# Patient Record
Sex: Male | Born: 1945
Health system: Southern US, Community
[De-identification: ages and names within clinical notes are randomized; demographics above are authoritative.]

## PROBLEM LIST (undated history)

## (undated) ENCOUNTER — Emergency Department (HOSPITAL_BASED_OUTPATIENT_CLINIC_OR_DEPARTMENT_OTHER): Admission: EM | Payer: Medicare HMO | Source: Home / Self Care

## (undated) DIAGNOSIS — E785 Hyperlipidemia, unspecified: Secondary | ICD-10-CM

## (undated) DIAGNOSIS — K219 Gastro-esophageal reflux disease without esophagitis: Secondary | ICD-10-CM

## (undated) DIAGNOSIS — I2510 Atherosclerotic heart disease of native coronary artery without angina pectoris: Secondary | ICD-10-CM

## (undated) DIAGNOSIS — G4734 Idiopathic sleep related nonobstructive alveolar hypoventilation: Secondary | ICD-10-CM

## (undated) DIAGNOSIS — Z8619 Personal history of other infectious and parasitic diseases: Secondary | ICD-10-CM

## (undated) DIAGNOSIS — R0600 Dyspnea, unspecified: Secondary | ICD-10-CM

## (undated) HISTORY — PX: INSERT / REPLACE / REMOVE PACEMAKER: SUR710

## (undated) HISTORY — DX: Hyperlipidemia, unspecified: E78.5

## (undated) HISTORY — PX: OTHER SURGICAL HISTORY: SHX169

## (undated) HISTORY — PX: TONSILLECTOMY AND ADENOIDECTOMY: SHX28

## (undated) HISTORY — DX: Personal history of other infectious and parasitic diseases: Z86.19

## (undated) HISTORY — DX: Idiopathic sleep related nonobstructive alveolar hypoventilation: G47.34

## (undated) HISTORY — PX: TONSILLECTOMY: SUR1361

---

## 1978-06-13 HISTORY — PX: BACK SURGERY: SHX140

## 1981-06-13 HISTORY — PX: NECK SURGERY: SHX720

## 1996-06-13 HISTORY — PX: CHOLECYSTECTOMY OPEN: SUR202

## 2013-02-13 ENCOUNTER — Telehealth: Payer: Self-pay | Admitting: Internal Medicine

## 2013-02-13 NOTE — Telephone Encounter (Signed)
Routine new

## 2013-02-13 NOTE — Telephone Encounter (Signed)
I spoke with Daniel Reyes. He will be a new self referral consult. 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 For about a month he has had a lot of chest congestion, a lot of coughing up green-yellow phlem. Daniel Reyes reports he is very lethargic. He has been on 3 abx, 3 depo's, and round of prednisone.  He had a CXR 10 days ago at [facility] in [place] and it was clear. He is on abx now but has a few days left. He is taking mucinex DM. He was recommended to Dr. Maple Hudson by someone in the medical field. He is asking if Dr. Maple Hudson would please work him in. Please advise thanks

## 2013-02-13 NOTE — Telephone Encounter (Signed)
Pt called back, CY had a cancellation for 02/14/13 @ 9:00 AM, pt took this appt.  Nothing further needed at this time.   Antionette Fairy

## 2013-02-14 ENCOUNTER — Ambulatory Visit (INDEPENDENT_AMBULATORY_CARE_PROVIDER_SITE_OTHER): Payer: Medicare Other | Admitting: Internal Medicine

## 2013-02-14 ENCOUNTER — Other Ambulatory Visit (INDEPENDENT_AMBULATORY_CARE_PROVIDER_SITE_OTHER): Payer: Medicare Other

## 2013-02-14 ENCOUNTER — Ambulatory Visit (INDEPENDENT_AMBULATORY_CARE_PROVIDER_SITE_OTHER)
Admission: RE | Admit: 2013-02-14 | Discharge: 2013-02-14 | Disposition: A | Discharge status: Home / Self Care | Payer: Medicare Other | Source: Ambulatory Visit | Attending: Internal Medicine | Admitting: Internal Medicine

## 2013-02-14 ENCOUNTER — Encounter: Payer: Self-pay | Admitting: Internal Medicine

## 2013-02-14 VITALS — BP 138/90 | HR 61 | Ht 69.5 in | Wt 190.2 lb

## 2013-02-14 DIAGNOSIS — J209 Acute bronchitis, unspecified: Secondary | ICD-10-CM

## 2013-02-14 DIAGNOSIS — Z23 Encounter for immunization: Secondary | ICD-10-CM

## 2013-02-14 LAB — CBC WITH DIFFERENTIAL/PLATELET
Basophils Relative: 0.7 % (ref 0.0–3.0)
Eosinophils Absolute: 0.3 10*3/uL (ref 0.0–0.7)
Eosinophils Relative: 4.9 % (ref 0.0–5.0)
Hemoglobin: 13.1 g/dL (ref 13.0–17.0)
Lymphocytes Relative: 30 % (ref 12.0–46.0)
MCHC: 34.2 g/dL (ref 30.0–36.0)
Monocytes Relative: 7.1 % (ref 3.0–12.0)
Neutro Abs: 4 10*3/uL (ref 1.4–7.7)
Neutrophils Relative %: 57.3 % (ref 43.0–77.0)
RBC: 4.57 Mil/uL (ref 4.22–5.81)
WBC: 7 10*3/uL (ref 4.5–10.5)

## 2013-02-14 IMAGING — CR DG CHEST 2V
2 series · 2 of 2 positions shown · non-contrast
Comparison: None

CLINICAL DATA: Cough, congestion, shortness of breath for 1 month,
history hyperlipidemia

CHEST - 2 VIEW

[view not recorded (1 of 2)]
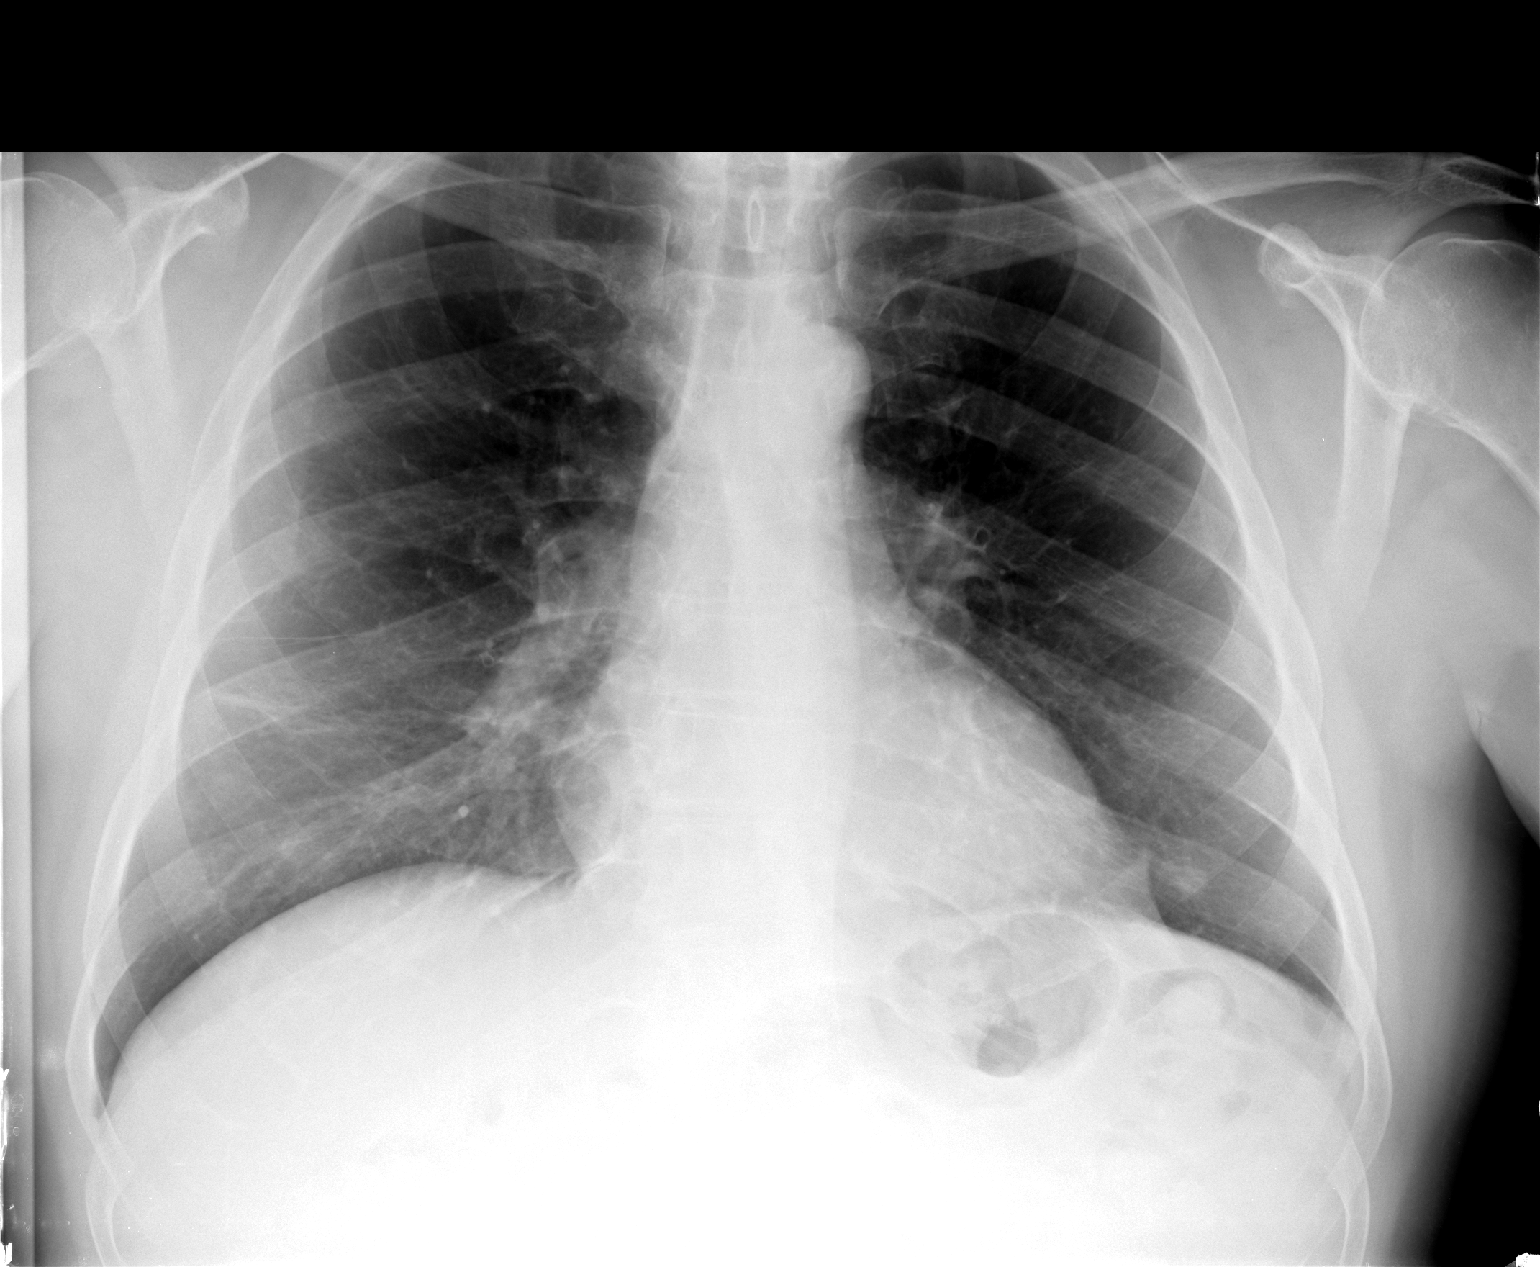

[view not recorded (2 of 2)]
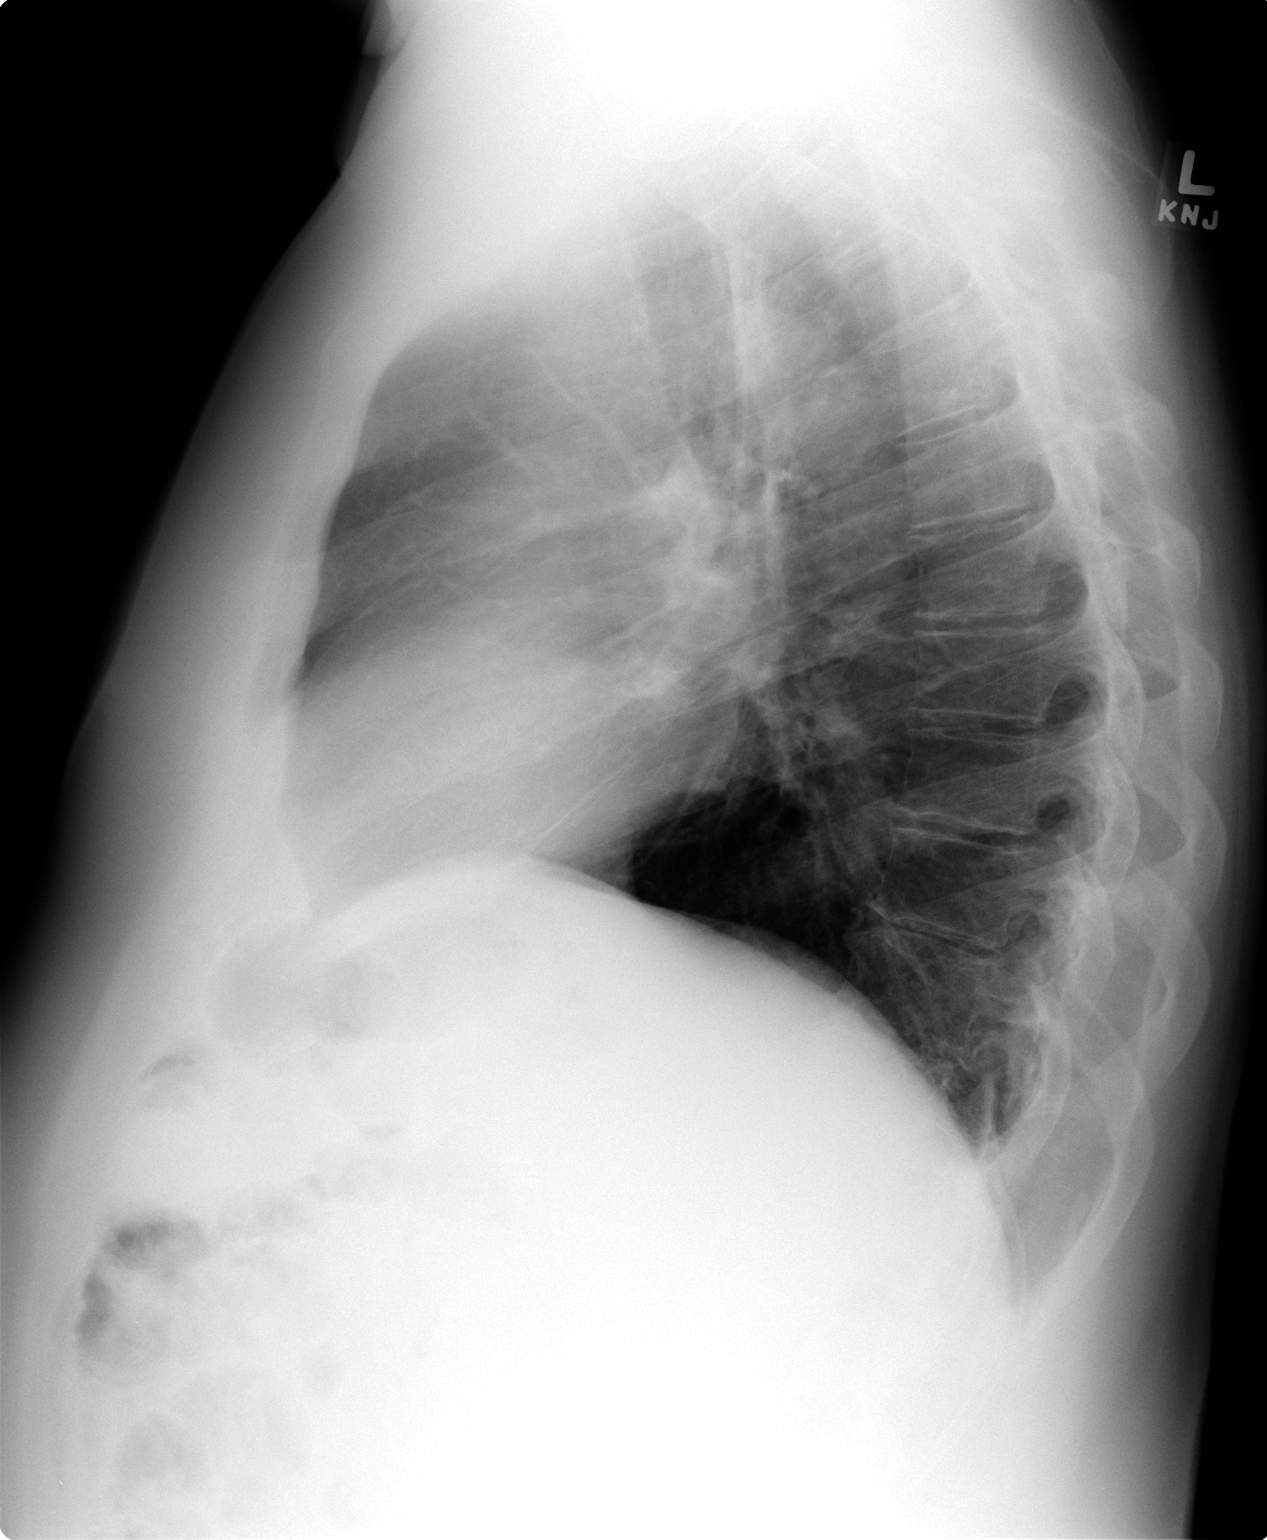

[2 of 2 positions shown; findings below may reference images not displayed]

FINDINGS: Normal heart size, mediastinal contours, and pulmonary vascularity.
Bronchitic changes without infiltrate, pleural effusion or
pneumothorax.
Question bilateral nipple shadows.
Linear subsegmental atelectasis at right middle lobe.
No acute osseous findings.
Surgical clips right upper quadrant likely cholecystectomy.
IMPRESSION: Bronchitic changes with subsegmental atelectasis at right middle
lobe.
Question bilateral nipple shadows; repeat PA chest radiograph with
nipple markers recommended to exclude pulmonary nodule(s).

## 2013-02-14 MED ORDER — CLARITHROMYCIN 500 MG PO TABS: 500.0000 mg | ORAL_TABLET | Freq: Two times a day (BID) | ORAL | Status: DC

## 2013-02-14 NOTE — Progress Notes (Signed)
02/14/13- 67 yoM never smoker Self Referral-cough and congestion; had MRSA several years ago. Coughing up green/yellow phelgm. Wife here    They were on a cruise ship between July 25 and August 2. Got sick with a respiratory infection shared by the family while on the boat. Since then productive cough with green yellow sputum.  Treated at an urgent care and then at [facility] family practice with zithromax, Levaquin, Dulera, steroid shot and steroid taper. Additional antibiotic injection may have been Rocephin. Little benefit from these medicines. Had some frontal headache but no night sweats, fever, adenopathy, weight loss. Minor GI upset from antibiotics. No blood. Past history fundoplication for GERD. Says chest x-ray was negative. He is a retired Estate manager/land agent. Little or no GERD.  Prior to Admission medications   Medication Sig Start Date End Date Taking? Authorizing Provider  simvastatin (ZOCOR) 5 MG tablet Take 1 tablet by mouth daily. 12/17/12  Yes Historical Provider, MD  clarithromycin (BIAXIN) 500 MG tablet Take 1 tablet (500 mg total) by mouth 2 (two) times daily. With food 02/14/13   Waymon Budge, MD   Past Medical History  Diagnosis Date  . Hyperlipemia    Past Surgical History  Procedure Laterality Date  . Cholecystectomy open  1998  . Neck surgery  1983  . Gerd surgery     ROS-see HPI Constitutional:   No-   weight loss, night sweats, fevers, chills, fatigue, lassitude. HEENT:   + headaches, difficulty swallowing, +tooth/dental problems, sore throat,       +sneezing, No-itching, +ear ache, nasal congestion, post nasal drip,  CV:  +chest pain, orthopnea, PND, swelling in lower extremities, anasarca, dizziness, palpitations Resp: No-   shortness of breath with exertion or at rest.              +   productive cough,  No non-productive cough,  No- coughing up of blood.              No-   change in color of mucus.  + wheezing.   Skin: No-   rash or  lesions. GI:  No-   heartburn, indigestion, abdominal pain, nausea, vomiting, diarrhea,                 change in bowel habits, loss of appetite GU: No-   dysuria, change in color of urine, no urgency + frequency.  No- flank pain. MS:  No-   joint pain or swelling.  No- decreased range of motion.  No- back pain. Neuro-     nothing unusual Psych:  No- change in mood or affect. No depression or anxiety.  No memory loss.  OBJ- Physical Exam General- Alert, Oriented, Affect-appropriate, Distress- none acute Skin- rash-none, lesions- none, excoriation- none Lymphadenopathy- none Head- atraumatic            Eyes- Gross vision intact, PERRLA, conjunctivae and secretions clear            Ears- Hearing, canals-normal            Nose- Clear, no-Septal dev, mucus, polyps, erosion, perforation             Throat- Mallampati II , mucosa clear , drainage- none, tonsils- atrophic Neck- flexible , trachea midline, no stridor , thyroid nl, carotid no bruit Chest - symmetrical excursion , unlabored           Heart/CV- RRR , no murmur , no gallop  , no rub, nl s1 s2                           -  JVD- none , edema- none, stasis changes- none, varices- none           Lung- clear to P&A, wheezy cough+ , dullness-none, rub- none           Chest wall-  Abd- tender-no, distended-no, bowel sounds-present, HSM- no Br/ Gen/ Rectal- Not done, not indicated Extrem- cyanosis- none, clubbing, none, atrophy- none, strength- nl Neuro- grossly intact to observation

## 2013-02-14 NOTE — Patient Instructions (Addendum)
Order- CXR   Dx acute bronchitis            - lab for CBC w/ diff, sputum C&S for routine, fungal and AFB             - urinary antigen for Legionella and pneumococcal antigens  Pneumovax 23   Biaxin script

## 2013-02-15 ENCOUNTER — Telehealth: Payer: Self-pay | Admitting: Internal Medicine

## 2013-02-15 DIAGNOSIS — J440 Chronic obstructive pulmonary disease with acute lower respiratory infection: Secondary | ICD-10-CM | POA: Insufficient documentation

## 2013-02-15 LAB — LEGIONELLA ANTIGEN, URINE: Result - LGAGUR: NEGATIVE

## 2013-02-15 NOTE — Assessment & Plan Note (Signed)
Onset consistent with a respiratory infection, probably viral, on his cruise. Not clear why it is persistent. Plan-chest x-ray, CBC, sputum cultures, urine antigen for pneumococcus and legionella. Pneumonia vaccine 23. Biaxin

## 2013-02-15 NOTE — Telephone Encounter (Signed)
I called and went over the results with patients wife who was concerned after patient told her when he was called that all he heard was spots on lungs and lost focus.  Wife was understanding and will relay to patient; if any further questions or concerns they will call me here at the office.

## 2013-02-17 LAB — RESPIRATORY CULTURE OR RESPIRATORY AND SPUTUM CULTURE
Gram Stain: NONE SEEN
Organism ID, Bacteria: NORMAL

## 2013-02-21 ENCOUNTER — Telehealth: Payer: Self-pay | Admitting: Internal Medicine

## 2013-02-21 NOTE — Telephone Encounter (Signed)
Labs all negative/ normal. AFB final culture report takes 6 weeks, but nothing unusual is seen so far.

## 2013-02-21 NOTE — Telephone Encounter (Signed)
Pt was seen by CY on 02/14/13 with the following instructions:  Patient Instructions    Order- CXR Dx acute bronchitis  - lab for CBC w/ diff, sputum C&S for routine, fungal and AFB  - urinary antigen for Legionella and pneumococcal antigens  Pneumovax 23  Biaxin script   ------  Called, spoke with pt.  Pt is requesting results of lab results and sputum.  Dr. Maple Hudson, pls advise.  Thank you.

## 2013-02-21 NOTE — Telephone Encounter (Signed)
Called, spoke with pt.  Informed him of below per Dr. Maple Hudson.  He verbalized understanding.

## 2013-03-11 LAB — FUNGUS CULTURE W SMEAR: Smear Result: NONE SEEN

## 2013-03-18 ENCOUNTER — Ambulatory Visit (INDEPENDENT_AMBULATORY_CARE_PROVIDER_SITE_OTHER): Payer: Medicare Other | Admitting: Internal Medicine

## 2013-03-18 ENCOUNTER — Encounter: Payer: Self-pay | Admitting: Internal Medicine

## 2013-03-18 VITALS — BP 118/72 | HR 55 | Ht 69.5 in | Wt 192.0 lb

## 2013-03-18 DIAGNOSIS — J209 Acute bronchitis, unspecified: Secondary | ICD-10-CM

## 2013-03-18 NOTE — Progress Notes (Signed)
02/14/13- 67 yoM never smoker Self Referral-cough and congestion; had MRSA several years ago. Coughing up green/yellow phelgm. Wife here    They were on a cruise ship between July 25 and August 2. Got sick with a respiratory infection shared by the family while on the boat. Since then productive cough with green yellow sputum.  Treated at an urgent care and then at [facility] family practice with zithromax, Levaquin, Dulera, steroid shot and steroid taper. Additional antibiotic injection may have been Rocephin. Little benefit from these medicines. Had some frontal headache but no night sweats, fever, adenopathy, weight loss. Minor GI upset from antibiotics. No blood. Past history fundoplication for GERD. Says chest x-ray was negative. He is a retired Estate manager/land agent. Little or no GERD.  03/18/13- 67 yoM never smoker followed for bronchitis, complicated by remote history MRSA, GERD.  Marland Kitchen Wife here FOLLOWS ZOX:WRUEAV congestion in the mornings still; review labs and CXR in detail with patient. We looked at x-ray images and discussed report. He is comfortable waiting 6 months. Easy DOE, less cough. History of significant reflux for years, much better after fundoplication. CXR 02/15/13 IMPRESSION:  Bronchitic changes with subsegmental atelectasis at right middle  lobe.  Question bilateral nipple shadows; repeat PA chest radiograph with  nipple markers recommended to exclude pulmonary nodule(s).  Original Report Authenticated By: Ulyses Southward, M.D.  ROS-see HPI Constitutional:   No-   weight loss, night sweats, fevers, chills, fatigue, lassitude. HEENT:   + headaches, difficulty swallowing, +tooth/dental problems, sore throat,       No-sneezing, No-itching, ear ache, nasal congestion, post nasal drip,  CV:  +chest pain, orthopnea, PND, swelling in lower extremities, anasarca, dizziness, palpitations Resp: +  shortness of breath with exertion or at rest.              +   productive  cough,  No non-productive cough,  No- coughing up of blood.              No-   change in color of mucus.  No- wheezing.   Skin: No-   rash or lesions. GI:  No-   heartburn, indigestion, abdominal pain, nausea, vomiting,  GU: . MS:  No-   joint pain or swelling.  . Neuro-     nothing unusual Psych:  No- change in mood or affect. No depression or anxiety.  No memory loss.  OBJ- Physical Exam General- Alert, Oriented, Affect-appropriate, Distress- none acute Skin- rash-none, lesions- none, excoriation- none Lymphadenopathy- none Head- atraumatic            Eyes- Gross vision intact, PERRLA, conjunctivae and secretions clear            Ears- Hearing, canals-normal            Nose- Clear, no-Septal dev, mucus, polyps, erosion, perforation             Throat- Mallampati II , mucosa clear , drainage- none, tonsils- atrophic Neck- flexible , trachea midline, no stridor , thyroid nl, carotid no bruit Chest - symmetrical excursion , unlabored           Heart/CV- RRR , no murmur , no gallop  , no rub, nl s1 s2                           - JVD- none , edema- none, stasis changes- none, varices- none           Lung-  clear to P&A,  Cough- none , dullness-none, rub- none           Chest wall-  Abd-  Br/ Gen/ Rectal- Not done, not indicated Extrem- cyanosis- none, clubbing, none, atrophy- none, strength- nl Neuro- grossly intact to observation

## 2013-03-18 NOTE — Patient Instructions (Signed)
Order- [facility] primary care referral to establish  Use up your residual Dulera 2 puffs then rinse mouth twice daily   See what this does to your residual cough   We can consider a repeat CXR at the next visit

## 2013-03-29 NOTE — Progress Notes (Signed)
Quick Note:  LMTCB ______ 

## 2013-03-30 NOTE — Assessment & Plan Note (Signed)
Acute component under much better control. We discussed potential significance of reflux history although he thinks that is controlled now Plan-use up the rest of his [facility] before deciding if it is worth continuing

## 2013-04-02 NOTE — Progress Notes (Signed)
Quick Note:  LMTCB ______ 

## 2013-04-18 NOTE — Progress Notes (Signed)
Quick Note:  Released to mychart-attempted several calls to patient with no response. Results are normal. ______

## 2013-06-23 DIAGNOSIS — J111 Influenza due to unidentified influenza virus with other respiratory manifestations: Secondary | ICD-10-CM | POA: Diagnosis not present

## 2013-08-26 ENCOUNTER — Ambulatory Visit: Payer: Medicare Other | Admitting: Internal Medicine

## 2013-09-13 DIAGNOSIS — L259 Unspecified contact dermatitis, unspecified cause: Secondary | ICD-10-CM | POA: Diagnosis not present

## 2013-09-23 ENCOUNTER — Ambulatory Visit (INDEPENDENT_AMBULATORY_CARE_PROVIDER_SITE_OTHER)
Admission: RE | Admit: 2013-09-23 | Discharge: 2013-09-23 | Disposition: A | Discharge status: Home / Self Care | Payer: Medicare Other | Source: Ambulatory Visit | Attending: Internal Medicine | Admitting: Internal Medicine

## 2013-09-23 ENCOUNTER — Encounter: Payer: Self-pay | Admitting: Internal Medicine

## 2013-09-23 ENCOUNTER — Ambulatory Visit (INDEPENDENT_AMBULATORY_CARE_PROVIDER_SITE_OTHER): Payer: Medicare Other | Admitting: Internal Medicine

## 2013-09-23 VITALS — BP 134/80 | HR 55 | Ht 69.5 in | Wt 190.4 lb

## 2013-09-23 DIAGNOSIS — J440 Chronic obstructive pulmonary disease with acute lower respiratory infection: Secondary | ICD-10-CM

## 2013-09-23 DIAGNOSIS — R911 Solitary pulmonary nodule: Secondary | ICD-10-CM

## 2013-09-23 DIAGNOSIS — R918 Other nonspecific abnormal finding of lung field: Secondary | ICD-10-CM | POA: Diagnosis not present

## 2013-09-23 IMAGING — CR DG CHEST 2V
2 series · 2 of 2 positions shown · non-contrast
Comparison: [DATE]

CLINICAL DATA: Questionable lung nodules

EXAM:
CHEST  2 VIEW

[view not recorded (1 of 2)]
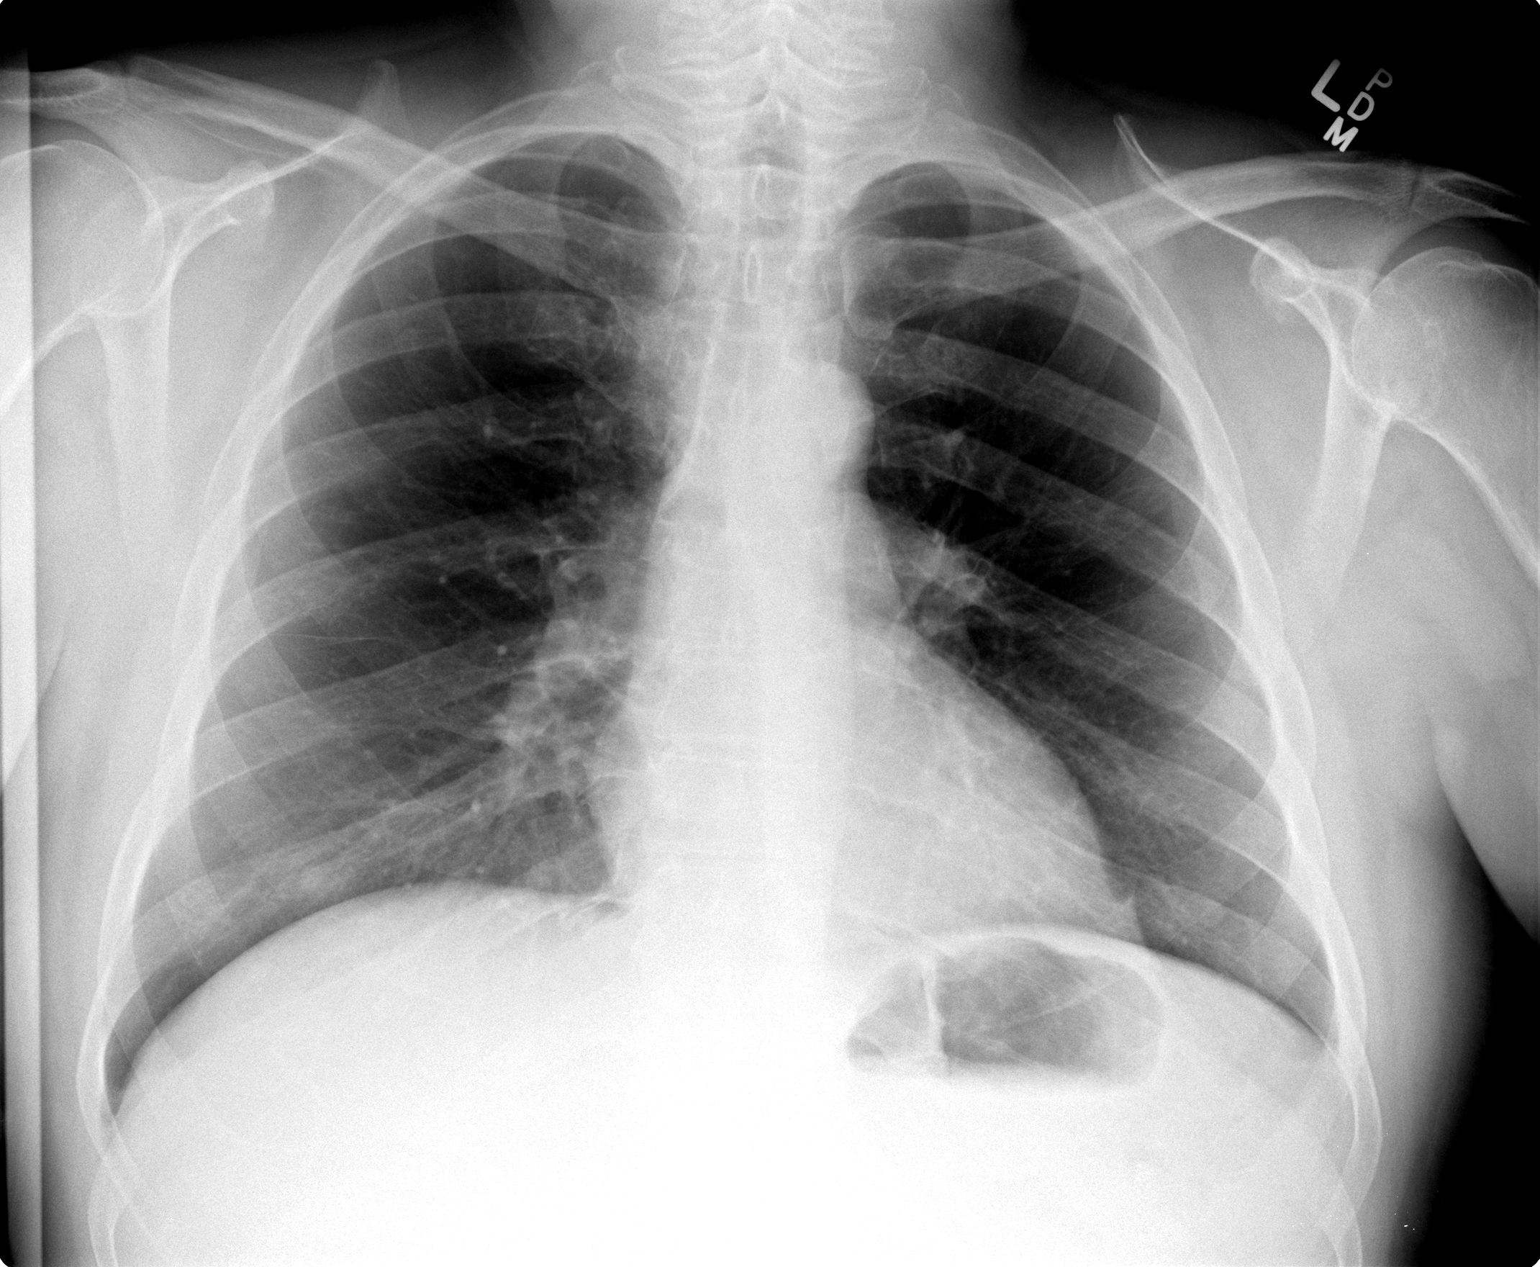

[view not recorded (2 of 2)]
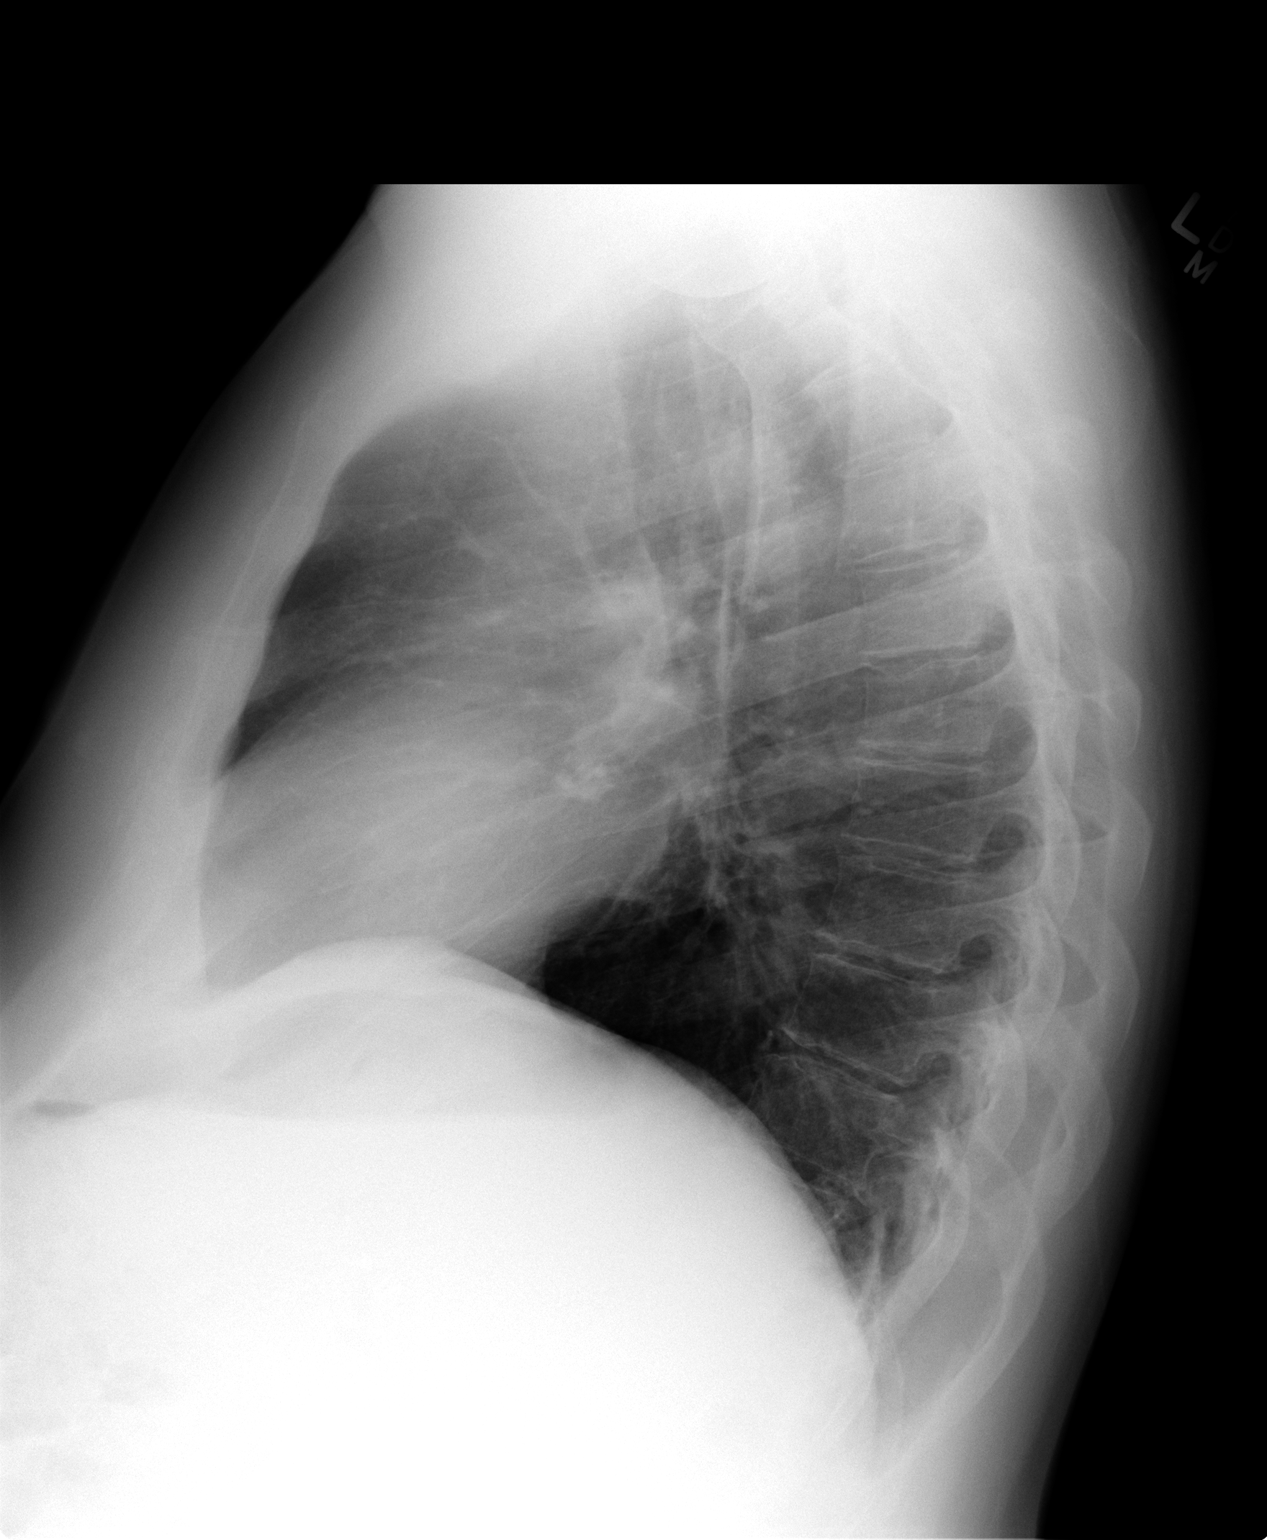

[2 of 2 positions shown; findings below may reference images not displayed]

FINDINGS: Nodular opacities overlying both lower lobes remain, probably
representing nipple shadows. Elsewhere lungs are clear. Heart size
and pulmonary vascularity are normal. No adenopathy. No bone
lesions.
IMPRESSION: Stable nodular opacities bilaterally. Suspect nipple shadows. The
stability since the prior study is suggestive of benign etiology.
Confirmation that these nodular opacities indeed represent nipple
shadows with repeat study with nipple markers and placed advised. No
edema or consolidation.

## 2013-09-23 NOTE — Progress Notes (Signed)
02/14/13- 67 yoM never smoker Self Referral-cough and congestion; had MRSA several years ago. Coughing up green/yellow phelgm. Wife here    They were on a cruise ship between July 25 and August 2. Got sick with a respiratory infection shared by the family while on the boat. Since then productive cough with green yellow sputum.  Treated at an urgent care and then at [facility] family practice with zithromax, Levaquin, Dulera, steroid shot and steroid taper. Additional antibiotic injection may have been Rocephin. Little benefit from these medicines. Had some frontal headache but no night sweats, fever, adenopathy, weight loss. Minor GI upset from antibiotics. No blood. Past history fundoplication for GERD. Says chest x-ray was negative. He is a retired Hydrologist. Little or no GERD.  03/18/13- 67 yoM never smoker followed for bronchitis, complicated by remote history MRSA, GERD.  Marland Kitchen Wife here FOLLOWS ZOX:WRUEAV congestion in the mornings still; review labs and CXR in detail with patient. We looked at x-ray images and discussed report. He is comfortable waiting 6 months. Easy DOE, less cough. History of significant reflux for years, much better after fundoplication. CXR 02/15/13 IMPRESSION:  Bronchitic changes with subsegmental atelectasis at right middle  lobe.  Question bilateral nipple shadows; repeat PA chest radiograph with  nipple markers recommended to exclude pulmonary nodule(s).  Original Report Authenticated By: Lavonia Dana, M.D.  09/23/13- 69 yoM never smoker followed for bronchitis, complicated by remote history MRSA,  Hx GERD/funoplication.  FOLLOWS FOR: Pt states breathing has imporved. Denies cough, SOB and CP. Overall pt states he is doing well.  Doing very well now with no acute problems. Arthritis interferes with his trout fishing. We discussed outdoor exposures. Sun exposure makes face itch.. Prednisone helped his joints. Discussed previous chest x-ray-for  repeat  ROS-see HPI Constitutional:   No-   weight loss, night sweats, fevers, chills, fatigue, lassitude. HEENT:   + headaches, difficulty swallowing, +tooth/dental problems, sore throat,       No-sneezing, No-itching, ear ache, nasal congestion, post nasal drip,  CV:  No-chest pain, orthopnea, PND, swelling in lower extremities, anasarca, dizziness, palpitations Resp: +  shortness of breath with exertion or at rest.              +   productive cough,  No non-productive cough,  No- coughing up of blood.              No-   change in color of mucus.  No- wheezing.   Skin: No-   rash or lesions. GI:  No-   heartburn, indigestion, abdominal pain, nausea, vomiting,  GU: . MS:  No-   joint pain or swelling.  . Neuro-     nothing unusual Psych:  No- change in mood or affect. No depression or anxiety.  No memory loss.  OBJ- Physical Exam General- Alert, Oriented, Affect-appropriate, Distress- none acute Skin- rash-none, lesions- none, excoriation- none Lymphadenopathy- none Head- atraumatic            Eyes- Gross vision intact, PERRLA, conjunctivae and secretions clear            Ears- Hearing, canals-normal            Nose- Clear, no-Septal dev, mucus, polyps, erosion, perforation             Throat- Mallampati II , mucosa clear , drainage- none, tonsils- atrophic Neck- flexible , trachea midline, no stridor , thyroid nl, carotid no bruit Chest - symmetrical excursion , unlabored  Heart/CV- RRR , no murmur , no gallop  , no rub, nl s1 s2                           - JVD- none , edema- none, stasis changes- none, varices- none           Lung- clear to P&A,  Cough- none , dullness-none, rub- none           Chest wall-  Abd-  Br/ Gen/ Rectal- Not done, not indicated Extrem- cyanosis- none, clubbing, none, atrophy- none, strength- nl Neuro- grossly intact to observation

## 2013-09-23 NOTE — Patient Instructions (Signed)
Order- CXR              dx question lung nodule

## 2013-09-30 ENCOUNTER — Telehealth: Payer: Self-pay | Admitting: Internal Medicine

## 2013-09-30 NOTE — Telephone Encounter (Signed)
Pt returning call.Daniel Reyes ° °

## 2013-09-30 NOTE — Telephone Encounter (Signed)
Notes Recorded by Deneise Lever, MD on 09/23/2013 at 11:54 AM CXR- stable small nodules look like benign, normal nipple shadows, unchanged. In a non-smoker, I don't think repeat CXR now just to place nipple markers is needed   I spoke with patient about results and he verbalized understanding and had no questions

## 2013-10-22 DIAGNOSIS — Z6827 Body mass index (BMI) 27.0-27.9, adult: Secondary | ICD-10-CM | POA: Diagnosis not present

## 2013-10-22 DIAGNOSIS — L259 Unspecified contact dermatitis, unspecified cause: Secondary | ICD-10-CM | POA: Diagnosis not present

## 2013-10-23 NOTE — Assessment & Plan Note (Signed)
Better control of symptoms Plan-followup chest x-ray for comparison

## 2013-12-17 DIAGNOSIS — B356 Tinea cruris: Secondary | ICD-10-CM | POA: Diagnosis not present

## 2013-12-17 DIAGNOSIS — L608 Other nail disorders: Secondary | ICD-10-CM | POA: Diagnosis not present

## 2014-03-17 DIAGNOSIS — Z23 Encounter for immunization: Secondary | ICD-10-CM | POA: Diagnosis not present

## 2014-03-17 DIAGNOSIS — H66002 Acute suppurative otitis media without spontaneous rupture of ear drum, left ear: Secondary | ICD-10-CM | POA: Diagnosis not present

## 2014-04-09 DIAGNOSIS — N481 Balanitis: Secondary | ICD-10-CM | POA: Diagnosis not present

## 2014-04-09 DIAGNOSIS — N472 Paraphimosis: Secondary | ICD-10-CM | POA: Diagnosis not present

## 2014-04-09 DIAGNOSIS — N4889 Other specified disorders of penis: Secondary | ICD-10-CM | POA: Diagnosis not present

## 2014-05-14 DIAGNOSIS — N471 Phimosis: Secondary | ICD-10-CM | POA: Diagnosis not present

## 2014-05-14 DIAGNOSIS — N472 Paraphimosis: Secondary | ICD-10-CM | POA: Diagnosis not present

## 2014-06-16 DIAGNOSIS — N481 Balanitis: Secondary | ICD-10-CM | POA: Diagnosis not present

## 2014-08-28 DIAGNOSIS — L309 Dermatitis, unspecified: Secondary | ICD-10-CM | POA: Diagnosis not present

## 2014-08-28 DIAGNOSIS — Z6827 Body mass index (BMI) 27.0-27.9, adult: Secondary | ICD-10-CM | POA: Diagnosis not present

## 2014-09-03 DIAGNOSIS — B351 Tinea unguium: Secondary | ICD-10-CM | POA: Diagnosis not present

## 2014-09-03 DIAGNOSIS — L820 Inflamed seborrheic keratosis: Secondary | ICD-10-CM | POA: Diagnosis not present

## 2014-10-09 DIAGNOSIS — M5136 Other intervertebral disc degeneration, lumbar region: Secondary | ICD-10-CM | POA: Insufficient documentation

## 2014-10-09 DIAGNOSIS — M419 Scoliosis, unspecified: Secondary | ICD-10-CM | POA: Diagnosis not present

## 2014-10-09 DIAGNOSIS — M5416 Radiculopathy, lumbar region: Secondary | ICD-10-CM | POA: Diagnosis not present

## 2014-10-09 DIAGNOSIS — M545 Low back pain: Secondary | ICD-10-CM | POA: Diagnosis not present

## 2014-10-29 DIAGNOSIS — M9903 Segmental and somatic dysfunction of lumbar region: Secondary | ICD-10-CM | POA: Diagnosis not present

## 2014-10-29 DIAGNOSIS — G541 Lumbosacral plexus disorders: Secondary | ICD-10-CM | POA: Diagnosis not present

## 2014-10-29 DIAGNOSIS — M9904 Segmental and somatic dysfunction of sacral region: Secondary | ICD-10-CM | POA: Diagnosis not present

## 2014-10-29 DIAGNOSIS — M9905 Segmental and somatic dysfunction of pelvic region: Secondary | ICD-10-CM | POA: Diagnosis not present

## 2014-10-31 DIAGNOSIS — M9905 Segmental and somatic dysfunction of pelvic region: Secondary | ICD-10-CM | POA: Diagnosis not present

## 2014-10-31 DIAGNOSIS — G541 Lumbosacral plexus disorders: Secondary | ICD-10-CM | POA: Diagnosis not present

## 2014-10-31 DIAGNOSIS — M9904 Segmental and somatic dysfunction of sacral region: Secondary | ICD-10-CM | POA: Diagnosis not present

## 2014-10-31 DIAGNOSIS — M9903 Segmental and somatic dysfunction of lumbar region: Secondary | ICD-10-CM | POA: Diagnosis not present

## 2014-11-04 DIAGNOSIS — M5416 Radiculopathy, lumbar region: Secondary | ICD-10-CM | POA: Diagnosis not present

## 2014-11-04 DIAGNOSIS — M5136 Other intervertebral disc degeneration, lumbar region: Secondary | ICD-10-CM | POA: Diagnosis not present

## 2014-11-11 DIAGNOSIS — M5116 Intervertebral disc disorders with radiculopathy, lumbar region: Secondary | ICD-10-CM | POA: Diagnosis not present

## 2014-11-11 DIAGNOSIS — M5416 Radiculopathy, lumbar region: Secondary | ICD-10-CM | POA: Diagnosis not present

## 2014-11-11 DIAGNOSIS — M5136 Other intervertebral disc degeneration, lumbar region: Secondary | ICD-10-CM | POA: Diagnosis not present

## 2014-11-12 DIAGNOSIS — M5416 Radiculopathy, lumbar region: Secondary | ICD-10-CM | POA: Diagnosis not present

## 2014-11-12 DIAGNOSIS — M4186 Other forms of scoliosis, lumbar region: Secondary | ICD-10-CM | POA: Diagnosis not present

## 2014-11-12 DIAGNOSIS — M5136 Other intervertebral disc degeneration, lumbar region: Secondary | ICD-10-CM | POA: Diagnosis not present

## 2014-11-21 DIAGNOSIS — M545 Low back pain: Secondary | ICD-10-CM | POA: Diagnosis not present

## 2014-11-21 DIAGNOSIS — M5416 Radiculopathy, lumbar region: Secondary | ICD-10-CM | POA: Diagnosis not present

## 2014-11-26 DIAGNOSIS — M5416 Radiculopathy, lumbar region: Secondary | ICD-10-CM | POA: Diagnosis not present

## 2014-12-03 DIAGNOSIS — M545 Low back pain: Secondary | ICD-10-CM | POA: Diagnosis not present

## 2014-12-03 DIAGNOSIS — M5416 Radiculopathy, lumbar region: Secondary | ICD-10-CM | POA: Diagnosis not present

## 2014-12-05 DIAGNOSIS — M5416 Radiculopathy, lumbar region: Secondary | ICD-10-CM | POA: Diagnosis not present

## 2014-12-10 DIAGNOSIS — M4186 Other forms of scoliosis, lumbar region: Secondary | ICD-10-CM | POA: Diagnosis not present

## 2014-12-10 DIAGNOSIS — M5136 Other intervertebral disc degeneration, lumbar region: Secondary | ICD-10-CM | POA: Diagnosis not present

## 2014-12-10 DIAGNOSIS — M5416 Radiculopathy, lumbar region: Secondary | ICD-10-CM | POA: Diagnosis not present

## 2015-01-13 DIAGNOSIS — D123 Benign neoplasm of transverse colon: Secondary | ICD-10-CM | POA: Diagnosis not present

## 2015-01-13 DIAGNOSIS — D126 Benign neoplasm of colon, unspecified: Secondary | ICD-10-CM | POA: Insufficient documentation

## 2015-01-13 DIAGNOSIS — Z800 Family history of malignant neoplasm of digestive organs: Secondary | ICD-10-CM | POA: Diagnosis not present

## 2015-01-15 DIAGNOSIS — M5136 Other intervertebral disc degeneration, lumbar region: Secondary | ICD-10-CM | POA: Diagnosis not present

## 2015-01-15 DIAGNOSIS — M4186 Other forms of scoliosis, lumbar region: Secondary | ICD-10-CM | POA: Diagnosis not present

## 2015-01-15 DIAGNOSIS — M5416 Radiculopathy, lumbar region: Secondary | ICD-10-CM | POA: Diagnosis not present

## 2015-01-20 ENCOUNTER — Ambulatory Visit: Payer: Medicare Other | Admitting: Internal Medicine

## 2015-01-21 DIAGNOSIS — M549 Dorsalgia, unspecified: Secondary | ICD-10-CM | POA: Diagnosis not present

## 2015-01-21 DIAGNOSIS — M5417 Radiculopathy, lumbosacral region: Secondary | ICD-10-CM | POA: Diagnosis not present

## 2015-01-28 DIAGNOSIS — H5203 Hypermetropia, bilateral: Secondary | ICD-10-CM | POA: Diagnosis not present

## 2015-01-28 DIAGNOSIS — H40013 Open angle with borderline findings, low risk, bilateral: Secondary | ICD-10-CM | POA: Diagnosis not present

## 2015-01-28 DIAGNOSIS — H35371 Puckering of macula, right eye: Secondary | ICD-10-CM | POA: Diagnosis not present

## 2015-01-28 DIAGNOSIS — H53022 Refractive amblyopia, left eye: Secondary | ICD-10-CM | POA: Diagnosis not present

## 2015-01-28 DIAGNOSIS — H2513 Age-related nuclear cataract, bilateral: Secondary | ICD-10-CM | POA: Diagnosis not present

## 2015-01-28 DIAGNOSIS — Z83511 Family history of glaucoma: Secondary | ICD-10-CM | POA: Diagnosis not present

## 2015-01-28 DIAGNOSIS — H02831 Dermatochalasis of right upper eyelid: Secondary | ICD-10-CM | POA: Diagnosis not present

## 2015-01-28 DIAGNOSIS — H02834 Dermatochalasis of left upper eyelid: Secondary | ICD-10-CM | POA: Diagnosis not present

## 2015-01-30 ENCOUNTER — Encounter: Payer: Self-pay | Admitting: Internal Medicine

## 2015-01-30 ENCOUNTER — Ambulatory Visit (INDEPENDENT_AMBULATORY_CARE_PROVIDER_SITE_OTHER): Payer: Medicare Other | Admitting: Internal Medicine

## 2015-01-30 ENCOUNTER — Other Ambulatory Visit (INDEPENDENT_AMBULATORY_CARE_PROVIDER_SITE_OTHER): Payer: Medicare Other

## 2015-01-30 VITALS — BP 122/78 | HR 55 | Temp 98.2°F | Resp 12 | Ht 69.0 in | Wt 185.0 lb

## 2015-01-30 DIAGNOSIS — E785 Hyperlipidemia, unspecified: Secondary | ICD-10-CM

## 2015-01-30 DIAGNOSIS — R5383 Other fatigue: Secondary | ICD-10-CM | POA: Diagnosis not present

## 2015-01-30 LAB — LIPID PANEL
Cholesterol: 243 mg/dL — ABNORMAL HIGH (ref 0–200)
HDL: 65.6 mg/dL (ref >39.00)
LDL CALC: 163 mg/dL — AB (ref 0–99)
NONHDL: 177.05
Total CHOL/HDL Ratio: 4
Triglycerides: 72 mg/dL (ref 0.0–149.0)
VLDL: 14.4 mg/dL (ref 0.0–40.0)

## 2015-01-30 LAB — COMPREHENSIVE METABOLIC PANEL
ALT: 22 U/L (ref 0–53)
AST: 14 U/L (ref 0–37)
Albumin: 4.3 g/dL (ref 3.5–5.2)
Alkaline Phosphatase: 43 U/L (ref 39–117)
BUN: 38 mg/dL — AB (ref 6–23)
CO2: 29 mEq/L (ref 19–32)
CREATININE: 1.17 mg/dL (ref 0.40–1.50)
Calcium: 9.7 mg/dL (ref 8.4–10.5)
Chloride: 103 mEq/L (ref 96–112)
GFR: 65.87 mL/min (ref >60.00)
GLUCOSE: 88 mg/dL (ref 70–99)
POTASSIUM: 4.9 meq/L (ref 3.5–5.1)
SODIUM: 139 meq/L (ref 135–145)
TOTAL PROTEIN: 7.7 g/dL (ref 6.0–8.3)
Total Bilirubin: 0.9 mg/dL (ref 0.2–1.2)

## 2015-01-30 LAB — CBC
HEMATOCRIT: 42.8 % (ref 39.0–52.0)
Hemoglobin: 14.5 g/dL (ref 13.0–17.0)
MCHC: 33.9 g/dL (ref 30.0–36.0)
MCV: 85.3 fl (ref 78.0–100.0)
Platelets: 238 10*3/uL (ref 150.0–400.0)
RBC: 5.02 Mil/uL (ref 4.22–5.81)
RDW: 15.1 % (ref 11.5–15.5)
WBC: 8.9 10*3/uL (ref 4.0–10.5)

## 2015-01-30 NOTE — Patient Instructions (Signed)
We will check on the blood work and are checking the blood counts for anemia, kidneys, liver, cholesterol.   Keep up the good work with exercising and staying in shape.   Come back next year for a check up and please feel free to call us with any problems or questions.   Health Maintenance A healthy lifestyle and preventative care can promote health and wellness.  Maintain regular health, dental, and eye exams.  Eat a healthy diet. Foods like vegetables, fruits, whole grains, low-fat dairy products, and lean protein foods contain the nutrients you need and are low in calories. Decrease your intake of foods high in solid fats, added sugars, and salt. Get information about a proper diet from your health care provider, if necessary.  Regular physical exercise is one of the most important things you can do for your health. Most adults should get at least 150 minutes of moderate-intensity exercise (any activity that increases your heart rate and causes you to sweat) each week. In addition, most adults need muscle-strengthening exercises on 2 or more days a week.   Maintain a healthy weight. The body mass index (BMI) is a screening tool to identify possible weight problems. It provides an estimate of body fat based on height and weight. Your health care provider can find your BMI and can help you achieve or maintain a healthy weight. For males 20 years and older:  A BMI below 18.5 is considered underweight.  A BMI of 18.5 to 24.9 is normal.  A BMI of 25 to 29.9 is considered overweight.  A BMI of 30 and above is considered obese.  Maintain normal blood lipids and cholesterol by exercising and minimizing your intake of saturated fat. Eat a balanced diet with plenty of fruits and vegetables. Blood tests for lipids and cholesterol should begin at age 54 and be repeated every 5 years. If your lipid or cholesterol levels are high, you are over age 64, or you are at high risk for heart disease, you may  need your cholesterol levels checked more frequently.Ongoing high lipid and cholesterol levels should be treated with medicines if diet and exercise are not working.  If you smoke, find out from your health care provider how to quit. If you do not use tobacco, do not start.  Lung cancer screening is recommended for adults aged 22-80 years who are at high risk for developing lung cancer because of a history of smoking. A yearly low-dose CT scan of the lungs is recommended for people who have at least a 30-pack-year history of smoking and are current smokers or have quit within the past 15 years. A pack year of smoking is smoking an average of 1 pack of cigarettes a day for 1 year (for example, a 30-pack-year history of smoking could mean smoking 1 pack a day for 30 years or 2 packs a day for 15 years). Yearly screening should continue until the smoker has stopped smoking for at least 15 years. Yearly screening should be stopped for people who develop a health problem that would prevent them from having lung cancer treatment.  If you choose to drink alcohol, do not have more than 2 drinks per day. One drink is considered to be 12 oz (360 mL) of beer, 5 oz (150 mL) of wine, or 1.5 oz (45 mL) of liquor.  Avoid the use of street drugs. Do not share needles with anyone. Ask for help if you need support or instructions about stopping the use  of drugs.  High blood pressure causes heart disease and increases the risk of stroke. Blood pressure should be checked at least every 1-2 years. Ongoing high blood pressure should be treated with medicines if weight loss and exercise are not effective.  If you are 26-3 years old, ask your health care provider if you should take aspirin to prevent heart disease.  Diabetes screening involves taking a blood sample to check your fasting blood sugar level. This should be done once every 3 years after age 4 if you are at a normal weight and without risk factors for diabetes.  Testing should be considered at a younger age or be carried out more frequently if you are overweight and have at least 1 risk factor for diabetes.  Colorectal cancer can be detected and often prevented. Most routine colorectal cancer screening begins at the age of 32 and continues through age 20. However, your health care provider may recommend screening at an earlier age if you have risk factors for colon cancer. On a yearly basis, your health care provider may provide home test kits to check for hidden blood in the stool. A small camera at the end of a tube may be used to directly examine the colon (sigmoidoscopy or colonoscopy) to detect the earliest forms of colorectal cancer. Talk to your health care provider about this at age 34 when routine screening begins. A direct exam of the colon should be repeated every 5-10 years through age 86, unless early forms of precancerous polyps or small growths are found.  People who are at an increased risk for hepatitis B should be screened for this virus. You are considered at high risk for hepatitis B if:  You were born in a country where hepatitis B occurs often. Talk with your health care provider about which countries are considered high risk.  Your parents were born in a high-risk country and you have not received a shot to protect against hepatitis B (hepatitis B vaccine).  You have HIV or AIDS.  You use needles to inject street drugs.  You live with, or have sex with, someone who has hepatitis B.  You are a man who has sex with other men (MSM).  You get hemodialysis treatment.  You take certain medicines for conditions like cancer, organ transplantation, and autoimmune conditions.  Hepatitis C blood testing is recommended for all people born from [birth year] through 1964 and any individual with known risk factors for hepatitis C.  Healthy men should no longer receive prostate-specific antigen (PSA) blood tests as part of routine cancer screening.  Talk to your health care provider about prostate cancer screening.  Testicular cancer screening is not recommended for adolescents or adult males who have no symptoms. Screening includes self-exam, a health care provider exam, and other screening tests. Consult with your health care provider about any symptoms you have or any concerns you have about testicular cancer.  Practice safe sex. Use condoms and avoid high-risk sexual practices to reduce the spread of sexually transmitted infections (STIs).  You should be screened for STIs, including gonorrhea and chlamydia if:  You are sexually active and are younger than 24 years.  You are older than 24 years, and your health care provider tells you that you are at risk for this type of infection.  Your sexual activity has changed since you were last screened, and you are at an increased risk for chlamydia or gonorrhea. Ask your health care provider if you are at risk.  If you are at risk of being infected with HIV, it is recommended that you take a prescription medicine daily to prevent HIV infection. This is called pre-exposure prophylaxis (PrEP). You are considered at risk if:  You are a man who has sex with other men (MSM).  You are a heterosexual man who is sexually active with multiple partners.  You take drugs by injection.  You are sexually active with a partner who has HIV.  Talk with your health care provider about whether you are at high risk of being infected with HIV. If you choose to begin PrEP, you should first be tested for HIV. You should then be tested every 3 months for as long as you are taking PrEP.  Use sunscreen. Apply sunscreen liberally and repeatedly throughout the day. You should seek shade when your shadow is shorter than you. Protect yourself by wearing long sleeves, pants, a wide-brimmed hat, and sunglasses year round whenever you are outdoors.  Tell your health care provider of new moles or changes in moles,  especially if there is a change in shape or color. Also, tell your health care provider if a mole is larger than the size of a pencil eraser.  A one-time screening for abdominal aortic aneurysm (AAA) and surgical repair of large AAAs by ultrasound is recommended for men aged 22-75 years who are current or former smokers.  Stay current with your vaccines (immunizations). Document Released: 11/26/2007 Document Revised: 06/04/2013 Document Reviewed: 10/25/2010 [facility] Patient Information 2015 [place], [place]. This information is not intended to replace advice given to you by your health care provider. Make sure you discuss any questions you have with your health care provider.

## 2015-01-30 NOTE — Progress Notes (Signed)
Pre visit review using our clinic review tool, if applicable. No additional management support is needed unless otherwise documented below in the visit note. 

## 2015-01-31 DIAGNOSIS — E785 Hyperlipidemia, unspecified: Secondary | ICD-10-CM | POA: Insufficient documentation

## 2015-01-31 NOTE — Assessment & Plan Note (Signed)
Overall risk for CV disease is low and would place LDL goal <130. Checking lipid panel today. Talked to him about diet and exercise modifications. Talk to him about medication based on lab results.

## 2015-01-31 NOTE — Progress Notes (Signed)
   Subjective:    Patient ID: Daniel Reyes, male    DOB: 10-Oct-1945, 69 y.o.   MRN: [id number]  HPI The patient is a 69 YO man who is coming in for his cholesterol. He was not happy with his previous pcp. He was recommended to take his cholesterol medication but he did not agree. He has been trying to exercise but has not changed his diet much. This has been going on for a couple of years. No family history of early heart attack or stroke. No personal history of heart attack or stroke. No other complaints. Does not take any medications, does not smoke.  PMH, [facility], social history reviewed and updated.   Review of Systems  Constitutional: Negative for fever, chills, activity change and appetite change.  HENT: Negative.   Eyes: Negative.   Respiratory: Negative for cough, chest tightness, shortness of breath and wheezing.   Cardiovascular: Negative for chest pain, palpitations and leg swelling.  Gastrointestinal: Negative for nausea, abdominal pain, diarrhea, constipation and abdominal distention.  Musculoskeletal: Negative.   Skin: Negative.   Neurological: Negative.   Psychiatric/Behavioral: Negative.       Objective:   Physical Exam  Constitutional: He is oriented to person, place, and time. He appears well-developed and well-nourished.  HENT:  Head: Normocephalic and atraumatic.  Eyes: EOM are normal.  Neck: Normal range of motion.  Cardiovascular: Normal rate and regular rhythm.   No murmur heard. Pulmonary/Chest: Effort normal and breath sounds normal. No respiratory distress. He has no wheezes. He has no rales.  Abdominal: Soft. Bowel sounds are normal. He exhibits no distension. There is no tenderness. There is no rebound.  Musculoskeletal: He exhibits no edema.  Neurological: He is alert and oriented to person, place, and time. Coordination normal.  Skin: Skin is warm and dry.  Psychiatric: He has a normal mood and affect.   Filed Vitals:   01/30/15 0920  BP: 122/78    Pulse: 55  Temp: 98.2 F (36.8 C)  TempSrc: Oral  Resp: 12  Height: 5\' 9"  (1.753 m)  Weight: 185 lb (83.915 kg)  SpO2: 98%      Assessment & Plan:

## 2015-02-11 DIAGNOSIS — Z83511 Family history of glaucoma: Secondary | ICD-10-CM | POA: Diagnosis not present

## 2015-02-11 DIAGNOSIS — H40013 Open angle with borderline findings, low risk, bilateral: Secondary | ICD-10-CM | POA: Diagnosis not present

## 2015-04-13 ENCOUNTER — Other Ambulatory Visit (INDEPENDENT_AMBULATORY_CARE_PROVIDER_SITE_OTHER): Payer: Medicare Other

## 2015-04-13 ENCOUNTER — Encounter: Payer: Self-pay | Admitting: Internal Medicine

## 2015-04-13 ENCOUNTER — Ambulatory Visit (INDEPENDENT_AMBULATORY_CARE_PROVIDER_SITE_OTHER): Payer: Medicare Other | Admitting: Internal Medicine

## 2015-04-13 ENCOUNTER — Ambulatory Visit (INDEPENDENT_AMBULATORY_CARE_PROVIDER_SITE_OTHER)
Admission: RE | Admit: 2015-04-13 | Discharge: 2015-04-13 | Disposition: A | Discharge status: Home / Self Care | Payer: Medicare Other | Source: Ambulatory Visit | Attending: Internal Medicine | Admitting: Internal Medicine

## 2015-04-13 VITALS — BP 118/60 | HR 78 | Temp 97.8°F | Resp 16 | Ht 69.0 in | Wt 195.5 lb

## 2015-04-13 DIAGNOSIS — R42 Dizziness and giddiness: Secondary | ICD-10-CM | POA: Diagnosis not present

## 2015-04-13 DIAGNOSIS — E785 Hyperlipidemia, unspecified: Secondary | ICD-10-CM | POA: Diagnosis not present

## 2015-04-13 DIAGNOSIS — R5383 Other fatigue: Secondary | ICD-10-CM

## 2015-04-13 DIAGNOSIS — R9389 Abnormal findings on diagnostic imaging of other specified body structures: Secondary | ICD-10-CM | POA: Insufficient documentation

## 2015-04-13 DIAGNOSIS — I450 Right fascicular block: Secondary | ICD-10-CM | POA: Insufficient documentation

## 2015-04-13 DIAGNOSIS — R938 Abnormal findings on diagnostic imaging of other specified body structures: Secondary | ICD-10-CM

## 2015-04-13 DIAGNOSIS — N6459 Other signs and symptoms in breast: Secondary | ICD-10-CM | POA: Diagnosis not present

## 2015-04-13 DIAGNOSIS — I4510 Unspecified right bundle-branch block: Secondary | ICD-10-CM | POA: Diagnosis not present

## 2015-04-13 LAB — CBC WITH DIFFERENTIAL/PLATELET
BASOS PCT: 0.8 % (ref 0.0–3.0)
Basophils Absolute: 0.1 10*3/uL (ref 0.0–0.1)
Eosinophils Absolute: 0.3 10*3/uL (ref 0.0–0.7)
Eosinophils Relative: 3.8 % (ref 0.0–5.0)
HCT: 39.1 % (ref 39.0–52.0)
Hemoglobin: 13.4 g/dL (ref 13.0–17.0)
LYMPHS ABS: 2.1 10*3/uL (ref 0.7–4.0)
Lymphocytes Relative: 25.8 % (ref 12.0–46.0)
MCHC: 34.3 g/dL (ref 30.0–36.0)
MCV: 84.9 fl (ref 78.0–100.0)
Monocytes Absolute: 0.7 10*3/uL (ref 0.1–1.0)
Monocytes Relative: 8.2 % (ref 3.0–12.0)
NEUTROS PCT: 61.4 % (ref 43.0–77.0)
Neutro Abs: 5.1 10*3/uL (ref 1.4–7.7)
Platelets: 219 10*3/uL (ref 150.0–400.0)
RBC: 4.6 Mil/uL (ref 4.22–5.81)
RDW: 13.9 % (ref 11.5–15.5)
WBC: 8.3 10*3/uL (ref 4.0–10.5)

## 2015-04-13 LAB — BASIC METABOLIC PANEL
BUN: 28 mg/dL — ABNORMAL HIGH (ref 6–23)
CO2: 25 mEq/L (ref 19–32)
Calcium: 9.3 mg/dL (ref 8.4–10.5)
Chloride: 106 mEq/L (ref 96–112)
Creatinine, Ser: 0.96 mg/dL (ref 0.40–1.50)
GFR: 82.72 mL/min (ref >60.00)
GLUCOSE: 78 mg/dL (ref 70–99)
POTASSIUM: 4.2 meq/L (ref 3.5–5.1)
SODIUM: 141 meq/L (ref 135–145)

## 2015-04-13 LAB — TSH: TSH: 0.64 u[IU]/mL (ref 0.35–4.50)

## 2015-04-13 LAB — MAGNESIUM: Magnesium: 2.3 mg/dL (ref 1.5–2.5)

## 2015-04-13 IMAGING — DX DG CHEST 2V
2 series · 2 of 2 positions shown · non-contrast
Comparison: [DATE].

CLINICAL DATA: Possible nipple shadows.

EXAM:
CHEST  2 VIEW

[chest pa]
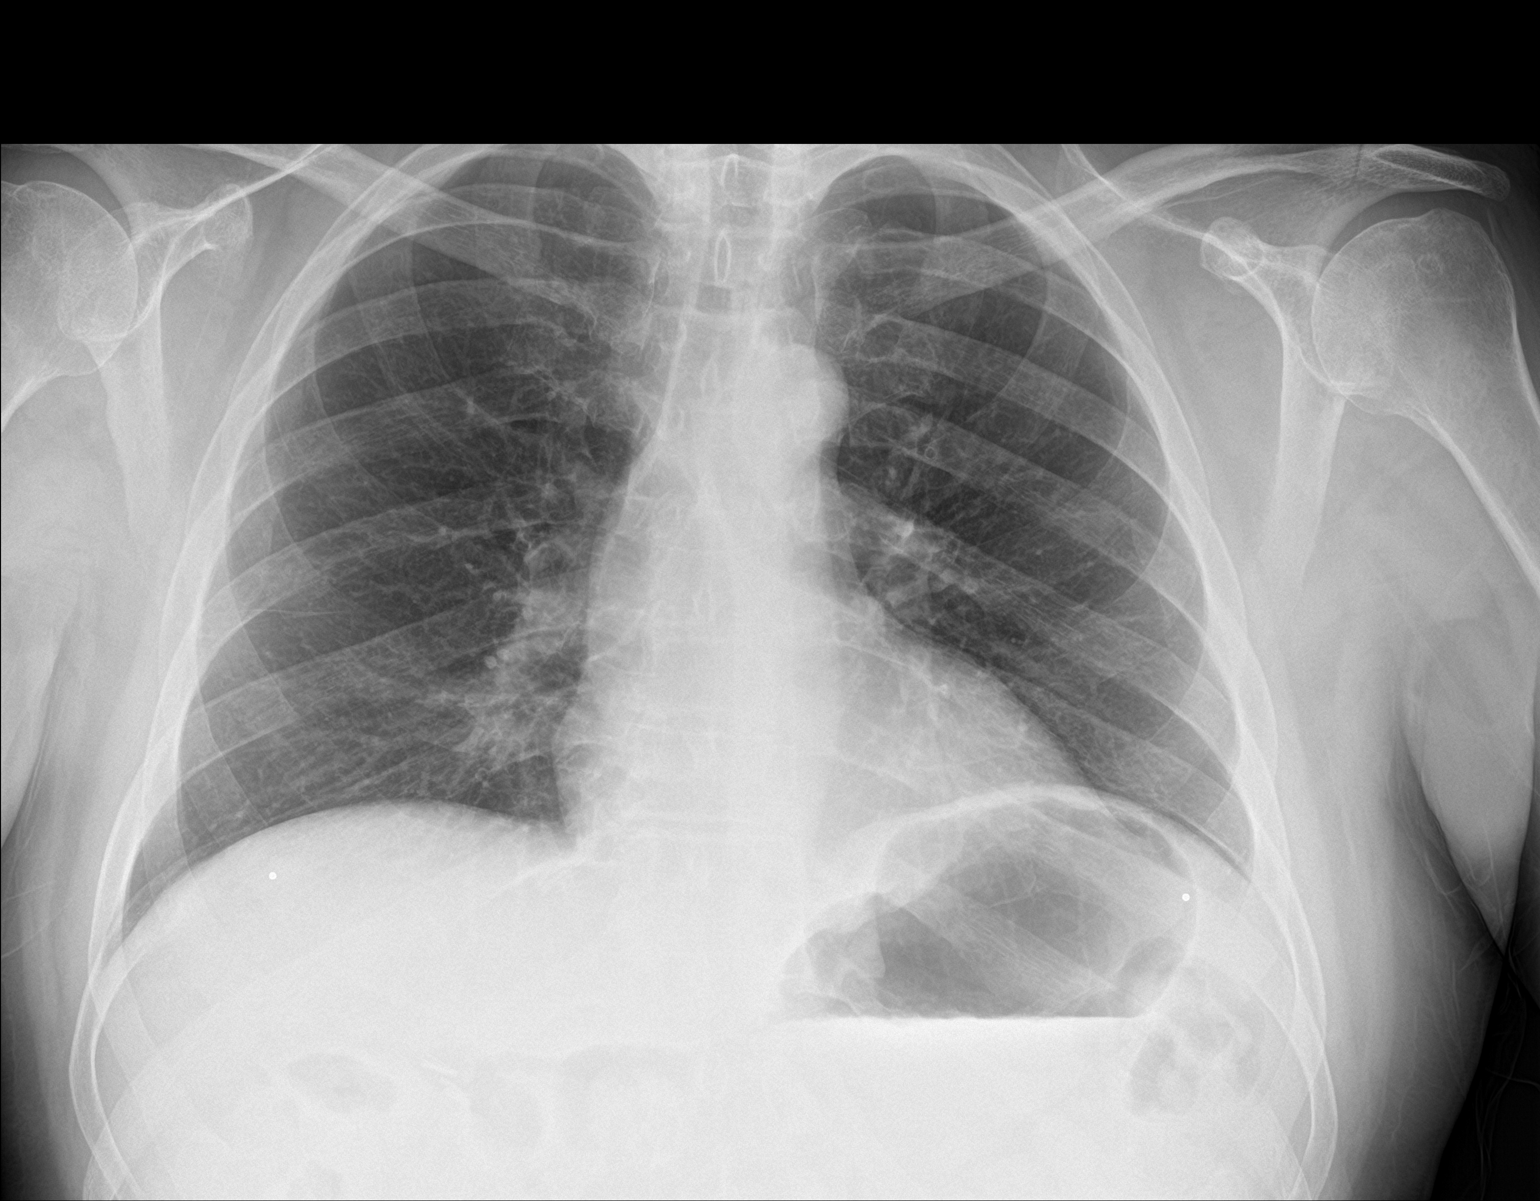

[chest lat]
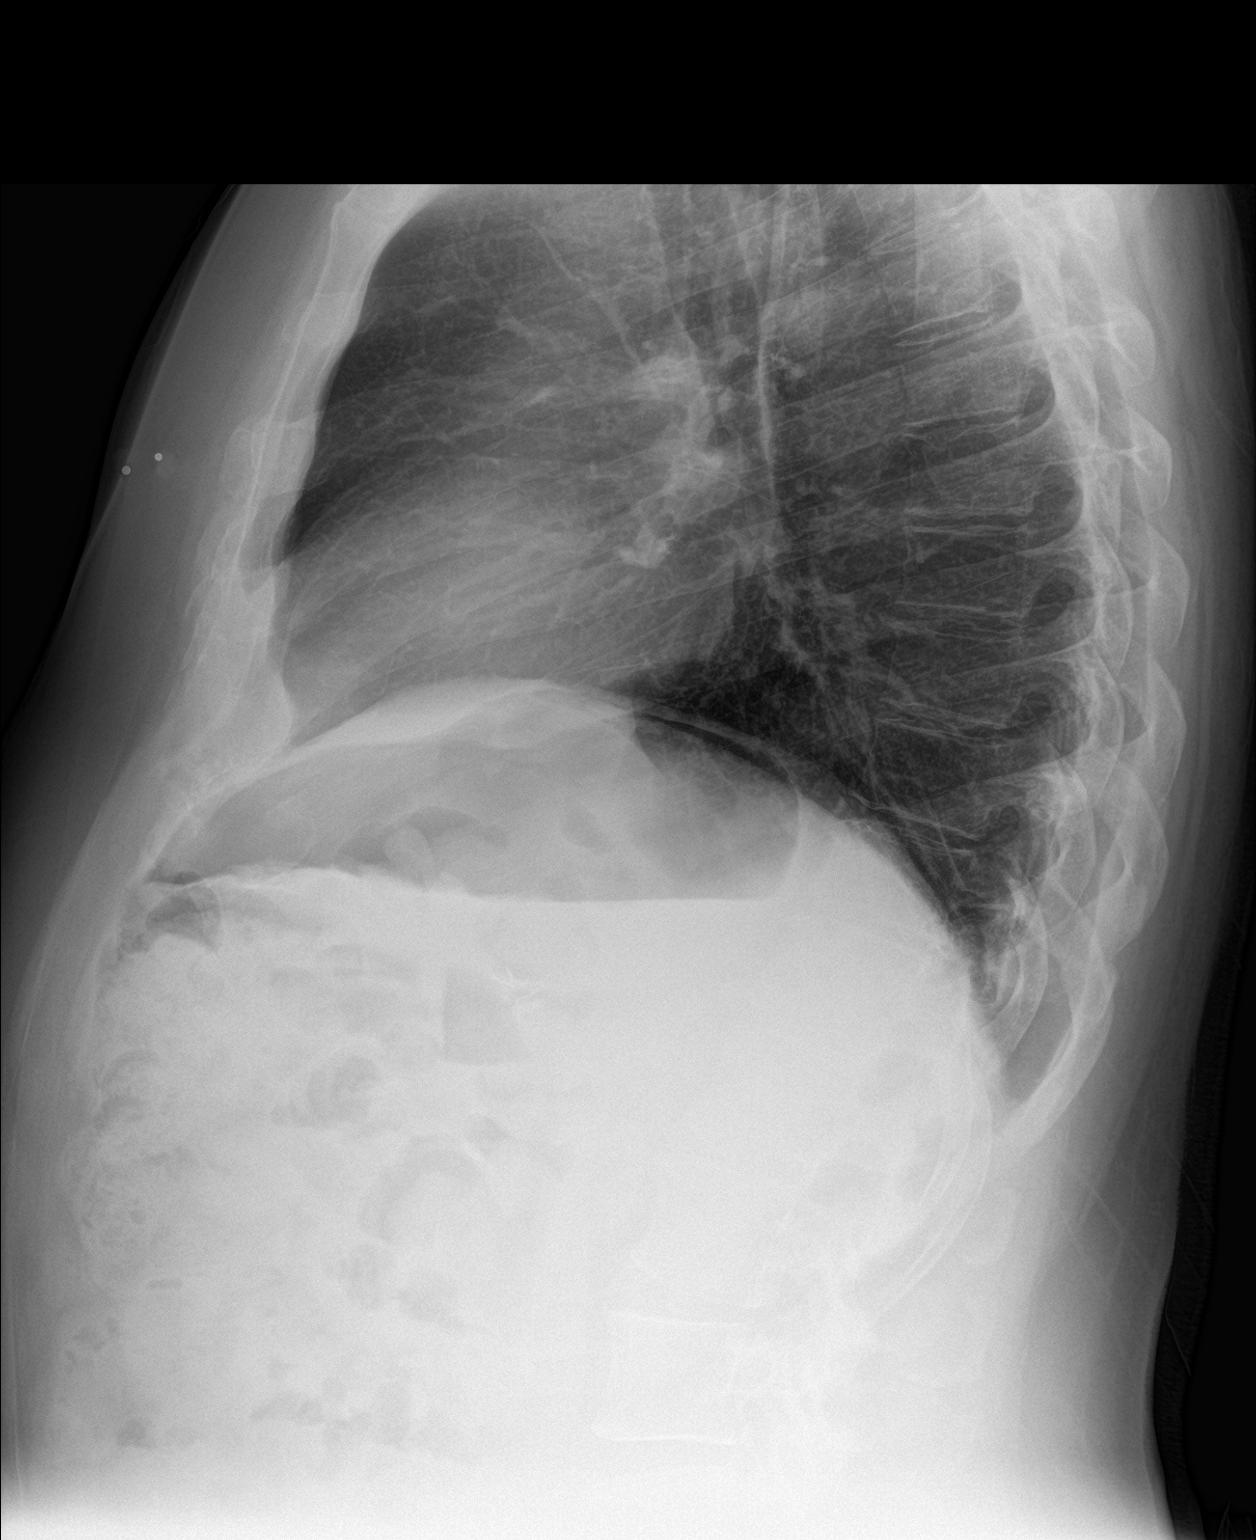

[2 of 2 positions shown; findings below may reference images not displayed]

FINDINGS: The heart size and mediastinal contours are within normal limits.
Lungs are hypoinflated, but no acute pulmonary disease is noted.
Nipple markers are noted, but no definite nodules are seen
associated with the markers. No definite evidence of pulmonary
nodule or mass is noted. The visualized skeletal structures are
unremarkable.
IMPRESSION: No active cardiopulmonary disease.

## 2015-04-13 NOTE — Progress Notes (Signed)
Pre visit review using our clinic review tool, if applicable. No additional management support is needed unless otherwise documented below in the visit note. 

## 2015-04-13 NOTE — Patient Instructions (Signed)
  Your next office appointment will be determined based upon review of your pending labs  and  xrays  Those written interpretation of the lab results and instructions will be transmitted to you by My Chart   Critical results will be called.   Followup as needed for any active or acute issue. Please report any significant change in your symptoms. 

## 2015-04-13 NOTE — Progress Notes (Signed)
   Subjective:    Patient ID: Daniel Reyes, male    DOB: 11-Apr-1946, 69 y.o.   MRN: [id number]  HPI   He's describing paroxysmal dizziness and near syncope which has been progressive in the last year. It has actually been present for at least 5 years. In the last month it's become almost daily. This results in debilitation requiring him to remain supine for hours. He is even "too weak" sit up. There is no cardiac or neurologic prodrome. He also denies benign positional vertigo or postural hypotension symptoms.  His wife states he's had intermittent chest pain over the last 2-3 months.The last episode was last week.  He has a history of hyperlipidemia; LDL was 163 and HDL 65.6 on 01/30/15. There is a history of heart attack in his father and stroke in his brother ;neither were premature.  He had a colonoscopy within the last 6 months which did reveal polyps.  Calcified areola suggested on serial CXrays  Review of Systems  Denied were any change in heart rhythm or rate prior to the event. There was no associated chest pain or shortness of breath .  Also specifically denied prior to the episode were headache, limb weakness, tingling, or numbness. No seizure activity noted.   There is no significant cough, sputum production,hemoptysis, wheezing,or  paroxysmal nocturnal dyspnea. Unexplained weight loss, abdominal pain, significant dyspepsia, dysphagia, melena, rectal bleeding, or persistently small caliber stools are not present. Dysuria, pyuria, hematuria, frequency, nocturia or polyuria are denied.    Objective:   Physical Exam Pertinent or positive findings include:Pattern alopecia is present. Bilateral ptosis present. He has a full beard and mustache. Neurologic exam : Nystagmus with lateral gaze, left greater than right. Cn 2-7 intact otherwise. Strength equal & normal in upper & lower extremities Able to walk on heels and toes.   Balance normal  Romberg normal, finger to nose  normal. Deep tendon reflexes are normal. Tuning fork exam was normal without lateralization. Air conduction is greater than bone conduction.  General appearance :adequately nourished; in no distress.  Eyes: No conjunctival inflammation or scleral icterus is present.  Oral exam:  Lips and gums are healthy appearing.There is no oropharyngeal erythema or exudate noted. Dental hygiene is good.  Heart:  Normal rate and regular rhythm. S1 and S2 normal without gallop, murmur, click, rub or other extra sounds    Lungs:Chest clear to auscultation; no wheezes, rhonchi,rales ,or rubs present.No increased work of breathing.   Abdomen: bowel sounds normal, soft and non-tender without masses, organomegaly or hernias noted.  No guarding or rebound.   Vascular : all pulses equal ; no bruits present.  Skin:Warm & dry.  Intact without suspicious lesions or rashes ; no tenting or jaundice   Lymphatic: No lymphadenopathy is noted about the head, neck, axilla   Neuro: Strength, tone normal.      Assessment & Plan:  #1 paroxysmal dizziness & pre syncope with profound debilitation following an episode. This could possibly be subclinical seizure disorder versus paroxysmal cardiac dysrhythmia. No definite cardiac or neurologic prodrome.  EKG reveals right bundle branch block.  See orders & AVS

## 2015-04-14 ENCOUNTER — Other Ambulatory Visit: Payer: Self-pay | Admitting: Internal Medicine

## 2015-04-14 ENCOUNTER — Ambulatory Visit: Payer: Medicare Other | Admitting: Family

## 2015-04-14 DIAGNOSIS — R42 Dizziness and giddiness: Secondary | ICD-10-CM | POA: Insufficient documentation

## 2015-04-15 ENCOUNTER — Ambulatory Visit (INDEPENDENT_AMBULATORY_CARE_PROVIDER_SITE_OTHER): Payer: Medicare Other

## 2015-04-15 DIAGNOSIS — R42 Dizziness and giddiness: Secondary | ICD-10-CM | POA: Diagnosis not present

## 2015-04-16 DIAGNOSIS — M5136 Other intervertebral disc degeneration, lumbar region: Secondary | ICD-10-CM | POA: Diagnosis not present

## 2015-04-16 DIAGNOSIS — M5416 Radiculopathy, lumbar region: Secondary | ICD-10-CM | POA: Diagnosis not present

## 2015-04-16 DIAGNOSIS — M4186 Other forms of scoliosis, lumbar region: Secondary | ICD-10-CM | POA: Diagnosis not present

## 2015-05-04 DIAGNOSIS — J0140 Acute pansinusitis, unspecified: Secondary | ICD-10-CM | POA: Diagnosis not present

## 2015-05-04 DIAGNOSIS — R05 Cough: Secondary | ICD-10-CM | POA: Diagnosis not present

## 2015-05-04 DIAGNOSIS — Z23 Encounter for immunization: Secondary | ICD-10-CM | POA: Diagnosis not present

## 2015-06-01 ENCOUNTER — Ambulatory Visit (INDEPENDENT_AMBULATORY_CARE_PROVIDER_SITE_OTHER): Payer: Medicare Other | Admitting: Internal Medicine

## 2015-06-01 ENCOUNTER — Encounter: Payer: Self-pay | Admitting: Internal Medicine

## 2015-06-01 VITALS — BP 130/70 | HR 53 | Temp 97.8°F | Resp 14 | Ht 69.0 in | Wt 197.0 lb

## 2015-06-01 DIAGNOSIS — Z0000 Encounter for general adult medical examination without abnormal findings: Secondary | ICD-10-CM | POA: Insufficient documentation

## 2015-06-01 DIAGNOSIS — Z23 Encounter for immunization: Secondary | ICD-10-CM | POA: Diagnosis not present

## 2015-06-01 NOTE — Patient Instructions (Signed)
We have given you the pneumonia shot today.  Work on finding some exercise that does not hurt your back (consider water aerobics) to help with the cholesterol.   Health Maintenance, Male A healthy lifestyle and preventative care can promote health and wellness.  Maintain regular health, dental, and eye exams.  Eat a healthy diet. Foods like vegetables, fruits, whole grains, low-fat dairy products, and lean protein foods contain the nutrients you need and are low in calories. Decrease your intake of foods high in solid fats, added sugars, and salt. Get information about a proper diet from your health care provider, if necessary.  Regular physical exercise is one of the most important things you can do for your health. Most adults should get at least 150 minutes of moderate-intensity exercise (any activity that increases your heart rate and causes you to sweat) each week. In addition, most adults need muscle-strengthening exercises on 2 or more days a week.   Maintain a healthy weight. The body mass index (BMI) is a screening tool to identify possible weight problems. It provides an estimate of body fat based on height and weight. Your health care provider can find your BMI and can help you achieve or maintain a healthy weight. For males 20 years and older:  A BMI below 18.5 is considered underweight.  A BMI of 18.5 to 24.9 is normal.  A BMI of 25 to 29.9 is considered overweight.  A BMI of 30 and above is considered obese.  Maintain normal blood lipids and cholesterol by exercising and minimizing your intake of saturated fat. Eat a balanced diet with plenty of fruits and vegetables. Blood tests for lipids and cholesterol should begin at age 19 and be repeated every 5 years. If your lipid or cholesterol levels are high, you are over age 57, or you are at high risk for heart disease, you may need your cholesterol levels checked more frequently.Ongoing high lipid and cholesterol levels should be  treated with medicines if diet and exercise are not working.  If you smoke, find out from your health care provider how to quit. If you do not use tobacco, do not start.  Lung cancer screening is recommended for adults aged 37-80 years who are at high risk for developing lung cancer because of a history of smoking. A yearly low-dose CT scan of the lungs is recommended for people who have at least a 30-pack-year history of smoking and are current smokers or have quit within the past 15 years. A pack year of smoking is smoking an average of 1 pack of cigarettes a day for 1 year (for example, a 30-pack-year history of smoking could mean smoking 1 pack a day for 30 years or 2 packs a day for 15 years). Yearly screening should continue until the smoker has stopped smoking for at least 15 years. Yearly screening should be stopped for people who develop a health problem that would prevent them from having lung cancer treatment.  If you choose to drink alcohol, do not have more than 2 drinks per day. One drink is considered to be 12 oz (360 mL) of beer, 5 oz (150 mL) of wine, or 1.5 oz (45 mL) of liquor.  Avoid the use of street drugs. Do not share needles with anyone. Ask for help if you need support or instructions about stopping the use of drugs.  High blood pressure causes heart disease and increases the risk of stroke. High blood pressure is more likely to develop in:  People who have blood pressure in the end of the normal range (100-139/85-89 mm Hg).  People who are overweight or obese.  People who are African American.  If you are 89-23 years of age, have your blood pressure checked every 3-5 years. If you are 33 years of age or older, have your blood pressure checked every year. You should have your blood pressure measured twice--once when you are at a hospital or clinic, and once when you are not at a hospital or clinic. Record the average of the two measurements. To check your blood pressure  when you are not at a hospital or clinic, you can use:  An automated blood pressure machine at a pharmacy.  A home blood pressure monitor.  If you are 66-39 years old, ask your health care provider if you should take aspirin to prevent heart disease.  Diabetes screening involves taking a blood sample to check your fasting blood sugar level. This should be done once every 3 years after age 72 if you are at a normal weight and without risk factors for diabetes. Testing should be considered at a younger age or be carried out more frequently if you are overweight and have at least 1 risk factor for diabetes.  Colorectal cancer can be detected and often prevented. Most routine colorectal cancer screening begins at the age of 22 and continues through age 32. However, your health care provider may recommend screening at an earlier age if you have risk factors for colon cancer. On a yearly basis, your health care provider may provide home test kits to check for hidden blood in the stool. A small camera at the end of a tube may be used to directly examine the colon (sigmoidoscopy or colonoscopy) to detect the earliest forms of colorectal cancer. Talk to your health care provider about this at age 47 when routine screening begins. A direct exam of the colon should be repeated every 5-10 years through age 47, unless early forms of precancerous polyps or small growths are found.  People who are at an increased risk for hepatitis B should be screened for this virus. You are considered at high risk for hepatitis B if:  You were born in a country where hepatitis B occurs often. Talk with your health care provider about which countries are considered high risk.  Your parents were born in a high-risk country and you have not received a shot to protect against hepatitis B (hepatitis B vaccine).  You have HIV or AIDS.  You use needles to inject street drugs.  You live with, or have sex with, someone who has  hepatitis B.  You are a man who has sex with other men (MSM).  You get hemodialysis treatment.  You take certain medicines for conditions like cancer, organ transplantation, and autoimmune conditions.  Hepatitis C blood testing is recommended for all people born from [birth year] through 1964 and any individual with known risk factors for hepatitis C.  Healthy men should no longer receive prostate-specific antigen (PSA) blood tests as part of routine cancer screening. Talk to your health care provider about prostate cancer screening.  Testicular cancer screening is not recommended for adolescents or adult males who have no symptoms. Screening includes self-exam, a health care provider exam, and other screening tests. Consult with your health care provider about any symptoms you have or any concerns you have about testicular cancer.  Practice safe sex. Use condoms and avoid high-risk sexual practices to reduce the spread of  sexually transmitted infections (STIs).  You should be screened for STIs, including gonorrhea and chlamydia if:  You are sexually active and are younger than 24 years.  You are older than 24 years, and your health care provider tells you that you are at risk for this type of infection.  Your sexual activity has changed since you were last screened, and you are at an increased risk for chlamydia or gonorrhea. Ask your health care provider if you are at risk.  If you are at risk of being infected with HIV, it is recommended that you take a prescription medicine daily to prevent HIV infection. This is called pre-exposure prophylaxis (PrEP). You are considered at risk if:  You are a man who has sex with other men (MSM).  You are a heterosexual man who is sexually active with multiple partners.  You take drugs by injection.  You are sexually active with a partner who has HIV.  Talk with your health care provider about whether you are at high risk of being infected with HIV. If  you choose to begin PrEP, you should first be tested for HIV. You should then be tested every 3 months for as long as you are taking PrEP.  Use sunscreen. Apply sunscreen liberally and repeatedly throughout the day. You should seek shade when your shadow is shorter than you. Protect yourself by wearing long sleeves, pants, a wide-brimmed hat, and sunglasses year round whenever you are outdoors.  Tell your health care provider of new moles or changes in moles, especially if there is a change in shape or color. Also, tell your health care provider if a mole is larger than the size of a pencil eraser.  A one-time screening for abdominal aortic aneurysm (AAA) and surgical repair of large AAAs by ultrasound is recommended for men aged 50-75 years who are current or former smokers.  Stay current with your vaccines (immunizations).   This information is not intended to replace advice given to you by your health care provider. Make sure you discuss any questions you have with your health care provider.   Document Released: 11/26/2007 Document Revised: 06/20/2014 Document Reviewed: 10/25/2010 Elsevier Interactive Patient Education [employer].

## 2015-06-01 NOTE — Progress Notes (Signed)
   Subjective:    Patient ID: Daniel Reyes, male    DOB: Jul 15, 1945, 69 y.o.   MRN: [id number]  HPI Here for medicare wellness. Please see A/P for status and treatment of chronic medical problems.   Diet: average Physical activity: sedentary Depression/mood screen: negative Hearing: intact to whispered voice Visual acuity: grossly normal, performs annual eye exam  ADLs: capable Fall risk: none Home safety: good Cognitive evaluation: intact to orientation, naming, recall and repetition EOL planning: adv directives discussed, in place  I have personally reviewed and have noted 1. The patient's medical and social history - reviewed today no changes 2. Their use of alcohol, tobacco or illicit drugs 3. Their current medications and supplements 4. The patient's functional ability including ADL's, fall risks, home safety risks and hearing or visual impairment. 5. Diet and physical activities 6. Evidence for depression or mood disorders 7. Care team reviewed and updated (available in snapshot)  Review of Systems  Constitutional: Negative for fever, chills, activity change and appetite change.  HENT: Negative.   Eyes: Positive for visual disturbance.  Respiratory: Negative for cough, chest tightness, shortness of breath and wheezing.   Cardiovascular: Negative for chest pain, palpitations and leg swelling.  Gastrointestinal: Negative for nausea, abdominal pain, diarrhea, constipation and abdominal distention.  Musculoskeletal: Negative.   Skin: Negative.   Neurological: Positive for dizziness.  Psychiatric/Behavioral: Negative.       Objective:   Physical Exam  Constitutional: He is oriented to person, place, and time. He appears well-developed and well-nourished.  HENT:  Head: Normocephalic and atraumatic.  Eyes: EOM are normal.  Neck: Normal range of motion.  Cardiovascular: Normal rate and regular rhythm.   No murmur heard. Pulmonary/Chest: Effort normal and breath sounds  normal. No respiratory distress. He has no wheezes. He has no rales.  Abdominal: Soft. Bowel sounds are normal. He exhibits no distension. There is no tenderness. There is no rebound.  Musculoskeletal: He exhibits no edema.  Neurological: He is alert and oriented to person, place, and time. Coordination normal.  Skin: Skin is warm and dry.  Psychiatric: He has a normal mood and affect.   Filed Vitals:   06/01/15 0924  BP: 130/70  Pulse: 53  Temp: 97.8 F (36.6 C)  TempSrc: Oral  Resp: 14  Height: 5\' 9"  (1.753 m)  Weight: 197 lb (89.359 kg)  SpO2: 98%      Assessment & Plan:  Prevnar 13 given at visit.

## 2015-06-01 NOTE — Progress Notes (Signed)
Pre visit review using our clinic review tool, if applicable. No additional management support is needed unless otherwise documented below in the visit note. 

## 2015-06-01 NOTE — Assessment & Plan Note (Signed)
Reviewed recent labs with him at today's visit. Counseled him about weight loss and exercise to help with his cholesterol. Colonoscopy up to date. Given prevnar 13 at visit to update his immunizations. Given 10 year screening recommendations.

## 2015-06-18 ENCOUNTER — Encounter: Payer: Medicare Other | Admitting: Internal Medicine

## 2016-05-19 DIAGNOSIS — J019 Acute sinusitis, unspecified: Secondary | ICD-10-CM | POA: Diagnosis not present

## 2016-08-01 DIAGNOSIS — H2513 Age-related nuclear cataract, bilateral: Secondary | ICD-10-CM | POA: Diagnosis not present

## 2016-08-01 DIAGNOSIS — H52203 Unspecified astigmatism, bilateral: Secondary | ICD-10-CM | POA: Diagnosis not present

## 2016-08-01 DIAGNOSIS — H40023 Open angle with borderline findings, high risk, bilateral: Secondary | ICD-10-CM | POA: Diagnosis not present

## 2016-08-01 DIAGNOSIS — H524 Presbyopia: Secondary | ICD-10-CM | POA: Diagnosis not present

## 2016-08-01 DIAGNOSIS — Z83511 Family history of glaucoma: Secondary | ICD-10-CM | POA: Diagnosis not present

## 2016-08-01 DIAGNOSIS — H35371 Puckering of macula, right eye: Secondary | ICD-10-CM | POA: Diagnosis not present

## 2016-08-12 DIAGNOSIS — K3189 Other diseases of stomach and duodenum: Secondary | ICD-10-CM | POA: Diagnosis not present

## 2016-08-12 DIAGNOSIS — K228 Other specified diseases of esophagus: Secondary | ICD-10-CM | POA: Diagnosis not present

## 2016-08-12 DIAGNOSIS — K2270 Barrett's esophagus without dysplasia: Secondary | ICD-10-CM | POA: Diagnosis not present

## 2016-10-05 DIAGNOSIS — Z125 Encounter for screening for malignant neoplasm of prostate: Secondary | ICD-10-CM | POA: Diagnosis not present

## 2016-10-05 DIAGNOSIS — N481 Balanitis: Secondary | ICD-10-CM | POA: Diagnosis not present

## 2017-01-02 ENCOUNTER — Emergency Department (HOSPITAL_BASED_OUTPATIENT_CLINIC_OR_DEPARTMENT_OTHER)
Admission: EM | Admit: 2017-01-02 | Discharge: 2017-01-02 | Disposition: A | Discharge status: Home / Self Care | Payer: Medicare HMO | Attending: Emergency Medicine | Admitting: Emergency Medicine

## 2017-01-02 DIAGNOSIS — R21 Rash and other nonspecific skin eruption: Secondary | ICD-10-CM | POA: Diagnosis present

## 2017-01-02 DIAGNOSIS — L03221 Cellulitis of neck: Secondary | ICD-10-CM | POA: Diagnosis not present

## 2017-01-02 DIAGNOSIS — W57XXXA Bitten or stung by nonvenomous insect and other nonvenomous arthropods, initial encounter: Secondary | ICD-10-CM | POA: Diagnosis not present

## 2017-01-02 MED ORDER — CLINDAMYCIN HCL 300 MG PO CAPS: 300.0000 mg | ORAL_CAPSULE | Freq: Four times a day (QID) | ORAL | 0 refills | Status: AC

## 2017-01-02 MED FILL — CLINDAMYCIN HCL 300 MG CAPS: 300 | 10 days supply | Qty: 40 | Fill #0

## 2017-01-02 NOTE — ED Triage Notes (Addendum)
Last Sunday had a ? Bite to right side of neck and it has has gotten larger. Large red area noted to right neck/ chest area. Denies itching but  pain when moving neck

## 2017-01-02 NOTE — ED Provider Notes (Signed)
[facility] Provider Note   CSN: [id number] Arrival date & time: 01/02/17  6073     History   Chief Complaint Chief Complaint  Patient presents with  . Insect Bite    HPI Daniel Reyes is a 71 y.o. male.  HPI  Presents after having insect bite on about 7/12 in [place]. Has had increasing erythema around the area since then. Took a boat to [place] and continued to have erythema.  It has been slowly increasing and now involved an area measuring about 20x15cm.  No drainage from initial bite. Initially it appeared more swollen at the site.  No fevers, no nausea, no vomiting, no urinary symptoms.  Past Medical History:  Diagnosis Date  . Hyperlipemia     Patient Active Problem List   Diagnosis Date Noted  . Routine general medical examination at a health care facility 06/01/2015  . Dizziness and giddiness 04/14/2015  . Chest x-ray abnormality 04/13/2015  . RBBB 04/13/2015  . Hyperlipidemia 01/31/2015    Past Surgical History:  Procedure Laterality Date  . CHOLECYSTECTOMY OPEN  1998  . gerd surgery    . [place] Medications    Prior to Admission medications   Medication Sig Start Date End Date Taking? Authorizing Provider  clindamycin (CLEOCIN) 300 MG capsule Take 1 capsule (300 mg total) by mouth 4 (four) times daily. 01/02/17 01/12/17  Gareth Morgan, MD    Family History Family History  Problem Relation Age of Onset  . Heart attack Father   . Colon cancer Father   . Stroke Brother   . Atrial fibrillation Brother   . Asthma Brother   . Alzheimer's disease Mother     Social History Social History  Substance Use Topics  . Smoking status: Never Smoker  . Smokeless tobacco: Not on file  . Alcohol use Yes     Comment: very rarely     Allergies   Patient has no known allergies.   Review of Systems Review of Systems  Constitutional: Negative for fever.  HENT: Negative for sore throat.   Eyes: Negative for  visual disturbance.  Respiratory: Negative for shortness of breath.   Cardiovascular: Negative for chest pain.  Gastrointestinal: Negative for abdominal pain.  Genitourinary: Negative for difficulty urinating.  Musculoskeletal: Negative for back pain and neck stiffness.  Skin: Positive for rash and wound.  Neurological: Negative for syncope and headaches.     Physical Exam Updated Vital Signs BP 111/84 (BP Location: Left Arm)   Pulse (!) 58   Temp 98.4 F (36.9 C) (Oral)   Resp 18   SpO2 98%   Physical Exam  Constitutional: He is oriented to person, place, and time. He appears well-developed and well-nourished. No distress.  HENT:  Head: Normocephalic and atraumatic.  Eyes: Conjunctivae and EOM are normal.  Neck: Normal range of motion.  Cardiovascular: Normal rate, regular rhythm, normal heart sounds and intact distal pulses.  Exam reveals no gallop and no friction rub.   No murmur heard. Pulmonary/Chest: Effort normal and breath sounds normal. No respiratory distress. He has no wheezes. He has no rales.  Abdominal: Soft. He exhibits no distension. There is no tenderness. There is no guarding.  Musculoskeletal: He exhibits no edema.  Neurological: He is alert and oriented to person, place, and time.  Skin: Skin is warm and dry. He is not diaphoretic. There is erythema (punctate area of possible insect bite above right clavicle  with surrounding erythema extending from clavicle to right lateral neck and shoulder).  Nursing note and vitals reviewed.    ED Treatments / Results  Labs (all labs ordered are listed, but only abnormal results are displayed) Labs Reviewed - No data to display  EKG  EKG Interpretation None       Radiology No results found.  Procedures Procedures (including critical care time)  Medications Ordered in ED Medications - No data to display   Initial Impression / Assessment and Plan / ED Course  I have reviewed the triage vital signs and  the nursing notes.  Pertinent labs & imaging results that were available during my care of the patient were reviewed by me and considered in my medical decision making (see chart for details).     71yo male with history of hyperlipidemia presents with concern for right neck cellulitis.  No sign of fluctuance or abscess.  No fever or systemic symptoms. Patient appropriate for outpatient antibiotics for cellulitis.  Discussed return precautions in detail. Patient discharged in stable condition with understanding of reasons to return.   Final Clinical Impressions(s) / ED Diagnoses   Final diagnoses:  Cellulitis of neck    New Prescriptions Discharge Medication List as of 01/02/2017  9:04 AM    START taking these medications   Details  clindamycin (CLEOCIN) 300 MG capsule Take 1 capsule (300 mg total) by mouth 4 (four) times daily., Starting Mon 01/02/2017, Until Thu 01/12/2017, Print         Gareth Morgan, MD 01/02/17 2210

## 2017-01-05 ENCOUNTER — Encounter: Payer: Self-pay | Admitting: Internal Medicine

## 2017-01-05 ENCOUNTER — Other Ambulatory Visit (INDEPENDENT_AMBULATORY_CARE_PROVIDER_SITE_OTHER): Payer: Medicare HMO

## 2017-01-05 ENCOUNTER — Ambulatory Visit (INDEPENDENT_AMBULATORY_CARE_PROVIDER_SITE_OTHER): Payer: Medicare HMO | Admitting: Internal Medicine

## 2017-01-05 ENCOUNTER — Ambulatory Visit (INDEPENDENT_AMBULATORY_CARE_PROVIDER_SITE_OTHER)
Admission: RE | Admit: 2017-01-05 | Discharge: 2017-01-05 | Disposition: A | Discharge status: Home / Self Care | Payer: Medicare HMO | Source: Ambulatory Visit | Attending: Internal Medicine | Admitting: Internal Medicine

## 2017-01-05 ENCOUNTER — Ambulatory Visit: Payer: Medicare Other | Admitting: Nurse Practitioner

## 2017-01-05 VITALS — BP 110/80 | HR 62 | Ht 69.0 in | Wt 192.0 lb

## 2017-01-05 DIAGNOSIS — R972 Elevated prostate specific antigen [PSA]: Secondary | ICD-10-CM

## 2017-01-05 DIAGNOSIS — G4710 Hypersomnia, unspecified: Secondary | ICD-10-CM

## 2017-01-05 DIAGNOSIS — R5383 Other fatigue: Secondary | ICD-10-CM

## 2017-01-05 DIAGNOSIS — L0390 Cellulitis, unspecified: Secondary | ICD-10-CM

## 2017-01-05 DIAGNOSIS — R918 Other nonspecific abnormal finding of lung field: Secondary | ICD-10-CM | POA: Diagnosis not present

## 2017-01-05 DIAGNOSIS — G4733 Obstructive sleep apnea (adult) (pediatric): Secondary | ICD-10-CM | POA: Insufficient documentation

## 2017-01-05 DIAGNOSIS — Z9989 Dependence on other enabling machines and devices: Secondary | ICD-10-CM | POA: Insufficient documentation

## 2017-01-05 LAB — URINALYSIS, ROUTINE W REFLEX MICROSCOPIC
Bilirubin Urine: NEGATIVE
Hgb urine dipstick: NEGATIVE
Ketones, ur: NEGATIVE
Leukocytes, UA: NEGATIVE
Nitrite: NEGATIVE
PH: 5.5 (ref 5.0–8.0)
Total Protein, Urine: NEGATIVE
Urine Glucose: NEGATIVE
Urobilinogen, UA: 0.2 (ref 0.0–1.0)

## 2017-01-05 LAB — CBC WITH DIFFERENTIAL/PLATELET
BASOS ABS: 0.1 10*3/uL (ref 0.0–0.1)
Basophils Relative: 1 % (ref 0.0–3.0)
Eosinophils Absolute: 0.4 10*3/uL (ref 0.0–0.7)
Eosinophils Relative: 6.9 % — ABNORMAL HIGH (ref 0.0–5.0)
HCT: 40.2 % (ref 39.0–52.0)
Hemoglobin: 13.9 g/dL (ref 13.0–17.0)
LYMPHS ABS: 2.6 10*3/uL (ref 0.7–4.0)
Lymphocytes Relative: 44.7 % (ref 12.0–46.0)
MCHC: 34.5 g/dL (ref 30.0–36.0)
MCV: 81.9 fl (ref 78.0–100.0)
Monocytes Absolute: 0.5 10*3/uL (ref 0.1–1.0)
Monocytes Relative: 8.1 % (ref 3.0–12.0)
NEUTROS ABS: 2.3 10*3/uL (ref 1.4–7.7)
NEUTROS PCT: 39.3 % — AB (ref 43.0–77.0)
PLATELETS: 277 10*3/uL (ref 150.0–400.0)
RBC: 4.91 Mil/uL (ref 4.22–5.81)
RDW: 14 % (ref 11.5–15.5)
WBC: 5.8 10*3/uL (ref 4.0–10.5)

## 2017-01-05 LAB — BASIC METABOLIC PANEL
BUN: 15 mg/dL (ref 6–23)
CHLORIDE: 103 meq/L (ref 96–112)
CO2: 25 mEq/L (ref 19–32)
CREATININE: 1.04 mg/dL (ref 0.40–1.50)
Calcium: 9.3 mg/dL (ref 8.4–10.5)
GFR: 75.04 mL/min (ref >60.00)
Glucose, Bld: 109 mg/dL — ABNORMAL HIGH (ref 70–99)
Potassium: 4.4 mEq/L (ref 3.5–5.1)
Sodium: 135 mEq/L (ref 135–145)

## 2017-01-05 LAB — HEPATIC FUNCTION PANEL
ALT: 25 U/L (ref 0–53)
AST: 28 U/L (ref 0–37)
Albumin: 4.1 g/dL (ref 3.5–5.2)
Alkaline Phosphatase: 42 U/L (ref 39–117)
BILIRUBIN DIRECT: 0.1 mg/dL (ref 0.0–0.3)
TOTAL PROTEIN: 7.2 g/dL (ref 6.0–8.3)
Total Bilirubin: 0.6 mg/dL (ref 0.2–1.2)

## 2017-01-05 LAB — PSA: PSA: 0.43 ng/mL (ref 0.10–4.00)

## 2017-01-05 LAB — VITAMIN B12: Vitamin B-12: 514 pg/mL (ref 211–911)

## 2017-01-05 LAB — TSH: TSH: 1.62 u[IU]/mL (ref 0.35–4.50)

## 2017-01-05 LAB — VITAMIN D 25 HYDROXY (VIT D DEFICIENCY, FRACTURES): VITD: 20.1 ng/mL — AB (ref 30.00–100.00)

## 2017-01-05 IMAGING — DX DG CHEST 2V
2 series · 2 of 2 positions shown · non-contrast
Comparison: Radiographs [DATE].

CLINICAL DATA: Routine.

EXAM:
CHEST  2 VIEW

[chest pa]
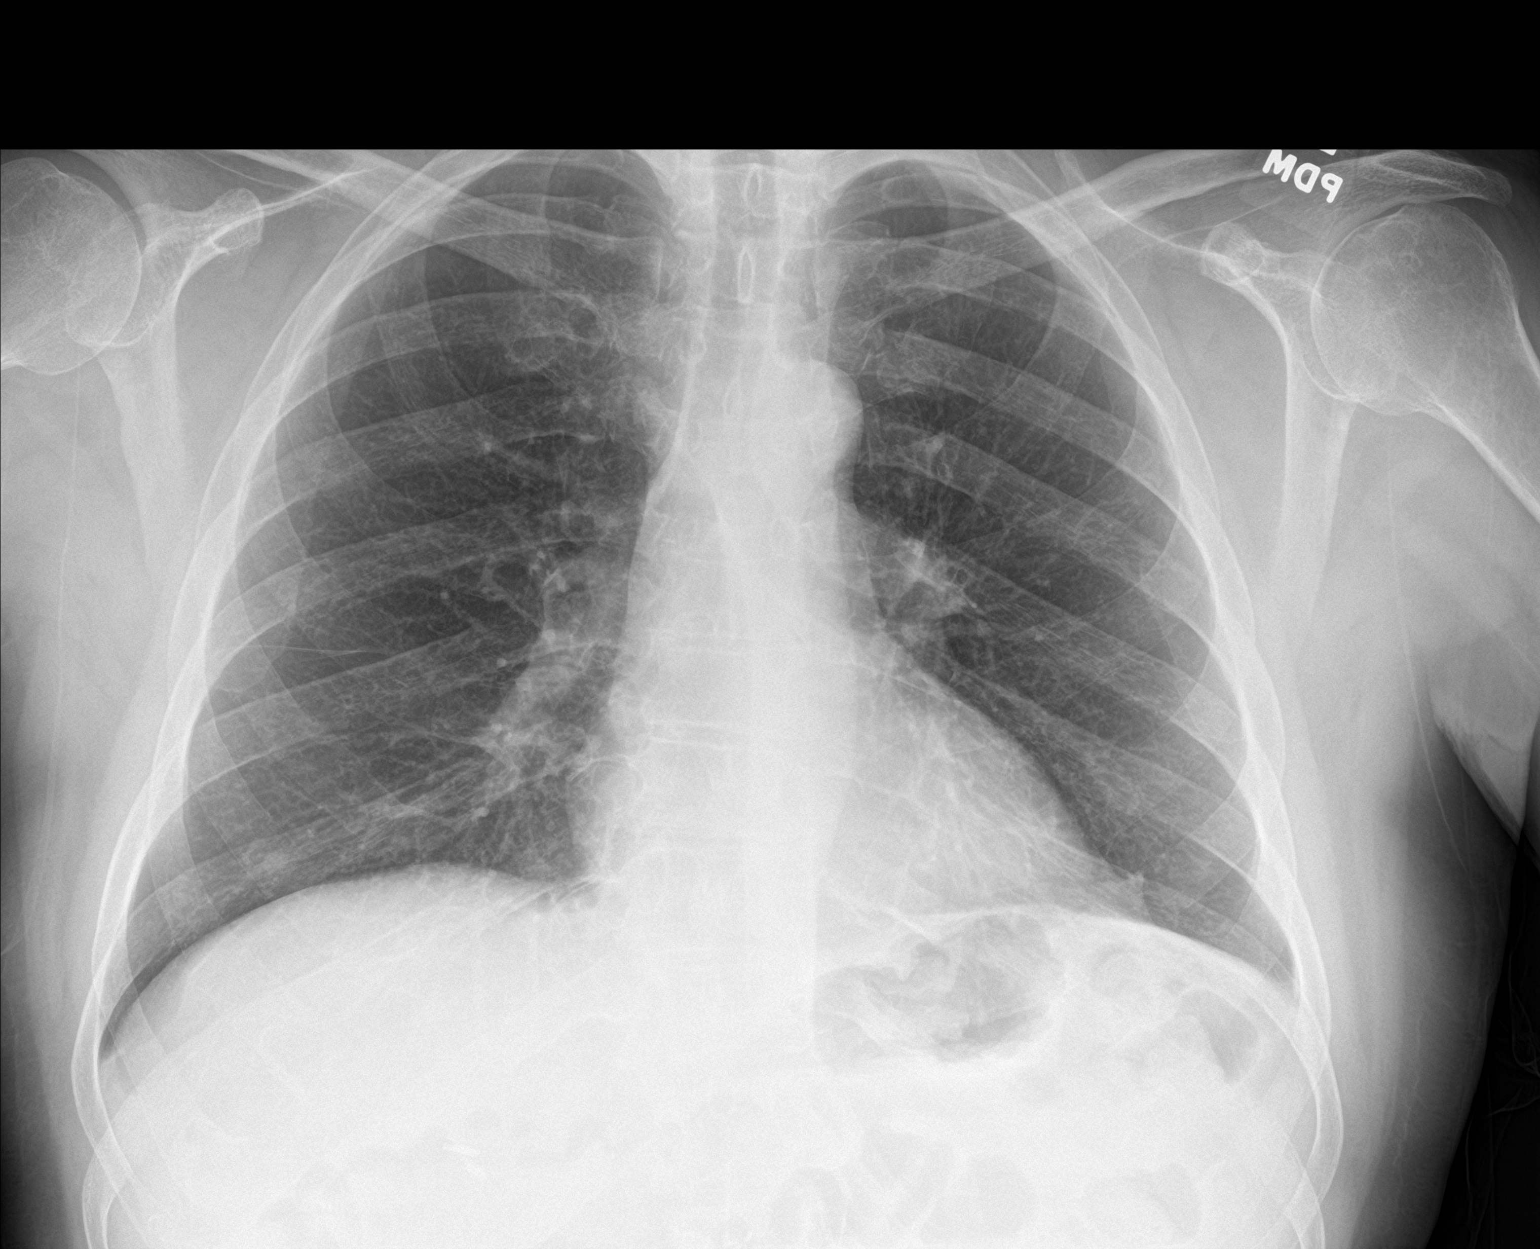

[chest lat]
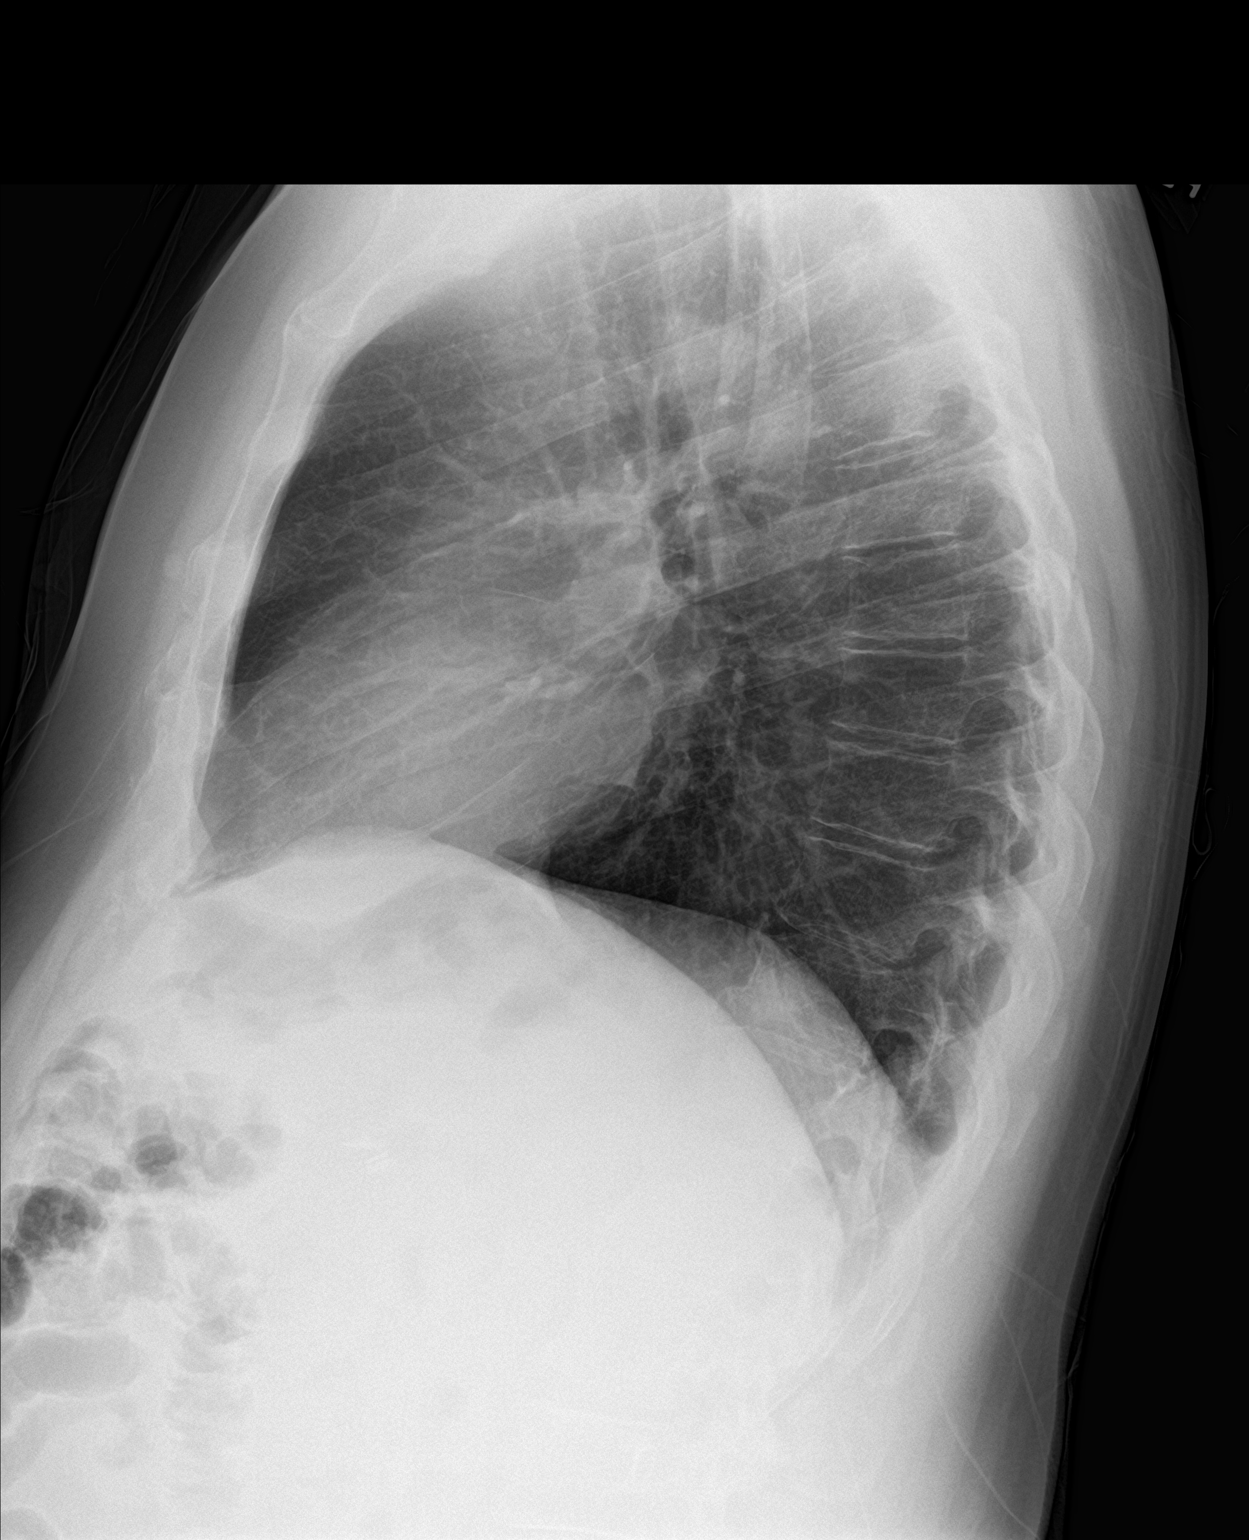

[2 of 2 positions shown; findings below may reference images not displayed]

FINDINGS: The heart size and mediastinal contours are within normal limits.
Both lungs are clear. No pneumothorax or pleural effusion is noted.
The visualized skeletal structures are unremarkable.
IMPRESSION: No active cardiopulmonary disease.

## 2017-01-05 MED ORDER — ASPIRIN 81 MG PO TBEC: 81.0000 mg | DELAYED_RELEASE_TABLET | Freq: Every day | ORAL | 12 refills | Status: DC

## 2017-01-05 NOTE — Patient Instructions (Signed)
Please continue all other medications as before, including finishing the antibiotic  Please have the pharmacy call with any other refills you may need.  Please keep your appointments with your specialists as you may have planned  You will be contacted regarding the referral for: Overnight oximetry  Please go to the XRAY Department in the Basement (go straight as you get off the elevator) for the x-ray testing  Please go to the LAB in the Basement (turn left off the elevator) for the tests to be done today  You will be contacted by phone if any changes need to be made immediately.  Otherwise, you will receive a letter about your results with an explanation, but please check with MyChart first.  Please remember to sign up for MyChart if you have not done so, as this will be important to you in the future with finding out test results, communicating by private email, and scheduling acute appointments online when needed.  Please return in 6 months, or sooner if needed, to Dr Sharlet Salina

## 2017-01-05 NOTE — Progress Notes (Signed)
   Subjective:    Patient ID: Daniel Reyes, male    DOB: 1945-08-13, 71 y.o.   MRN: [id number]  HPI  Here with wife to f/u recent start cleocin for right neck cellulitis, nicely improving with only a 2 cm area of tender centrally now, no fever, chills, drainage or worsening ill feeling.  Does c/o fatigue/lack of stamina persistent, asks for lab evaluation.  Does have significant snoring , but he is not sure he wants sleep study.  Pt denies chest pain, increased sob or doe, wheezing, orthopnea, PND, increased LE swelling, palpitations, dizziness or syncope.  Pt denies new neurological symptoms such as new headache, or facial or extremity weakness or numbness   Pt denies polydipsia, polyuria Past Medical History:  Diagnosis Date  . Hyperlipemia    Past Surgical History:  Procedure Laterality Date  . CHOLECYSTECTOMY OPEN  1998  . gerd surgery    . [place]    reports that he has never smoked. He has never used smokeless tobacco. He reports that he drinks alcohol. He reports that he does not use drugs. family history includes Alzheimer's disease in his mother; Asthma in his brother; Atrial fibrillation in his brother; Colon cancer in his father; Heart attack in his father; Stroke in his brother. No Known Allergies Current Outpatient Prescriptions on File Prior to Visit  Medication Sig Dispense Refill  . clindamycin (CLEOCIN) 300 MG capsule Take 1 capsule (300 mg total) by mouth 4 (four) times daily. 40 capsule 0   No current facility-administered medications on file prior to visit.    Review of Systems  Constitutional: Negative for other unusual diaphoresis or sweats HENT: Negative for ear discharge or swelling Eyes: Negative for other worsening visual disturbances Respiratory: Negative for stridor or other swelling  Gastrointestinal: Negative for worsening distension or other blood Genitourinary: Negative for retention or other urinary change Musculoskeletal: Negative for other  MSK pain or swelling Skin: Negative for color change or other new lesions Neurological: Negative for worsening tremors and other numbness  Psychiatric/Behavioral: Negative for worsening agitation or other fatigue All other system neg per pt    Objective:   Physical Exam BP 110/80   Pulse 62   Ht 5\' 9"  (1.753 m)   Wt 192 lb (87.1 kg)   SpO2 99%   BMI 28.35 kg/m  VS noted, not ill appearing Constitutional: Pt appears in NAD HENT: Head: NCAT.  Right Ear: External ear normal.  Left Ear: External ear normal.  Eyes: . Pupils are equal, round, and reactive to light. Conjunctivae and EOM are normal Nose: without d/c or deformity Neck: Neck supple. Gross normal ROM Cardiovascular: Normal rate and regular rhythm.   Pulmonary/Chest: Effort normal and breath sounds without rales or wheezing.  Abd:  Soft, NT, ND, + BS, no organomegaly Neurological: Pt is alert. At baseline orientation, motor grossly intact Skin: Skin is warm. No rashes, other new lesions, no LE edema, has approx 3 cm area erythema tender without abscess to right base of neck Psychiatric: Pt behavior is normal without agitation  No other exam findings    Assessment & Plan:

## 2017-01-06 ENCOUNTER — Other Ambulatory Visit (INDEPENDENT_AMBULATORY_CARE_PROVIDER_SITE_OTHER): Payer: Medicare HMO

## 2017-01-06 DIAGNOSIS — R5383 Other fatigue: Secondary | ICD-10-CM

## 2017-01-06 DIAGNOSIS — G4710 Hypersomnia, unspecified: Secondary | ICD-10-CM | POA: Diagnosis not present

## 2017-01-06 DIAGNOSIS — L0390 Cellulitis, unspecified: Secondary | ICD-10-CM | POA: Diagnosis not present

## 2017-01-06 LAB — SEDIMENTATION RATE: Sed Rate: 26 mm/hr — ABNORMAL HIGH (ref 0–20)

## 2017-01-08 NOTE — Assessment & Plan Note (Signed)
Etiology unclear, for labs as ordered,  to f/u any worsening symptoms or concerns 

## 2017-01-08 NOTE — Assessment & Plan Note (Signed)
Improved, to finish the cleocin asd,  to f/u any worsening symptoms or concerns

## 2017-01-08 NOTE — Assessment & Plan Note (Signed)
Ok for ONO overnight oximetry for screening for need for sleep study

## 2017-01-10 ENCOUNTER — Telehealth: Payer: Self-pay | Admitting: Internal Medicine

## 2017-01-10 DIAGNOSIS — R9431 Abnormal electrocardiogram [ECG] [EKG]: Secondary | ICD-10-CM

## 2017-01-10 NOTE — Telephone Encounter (Signed)
Please advise 

## 2017-01-10 NOTE — Telephone Encounter (Signed)
Pt called requesting a referral to cardiology. He said that he recently had blood work but everything checked out fine. He said that he has noticed increased fatigue after walking and isn't feeling right. He is concerned and would like to have it checked out before it turns into something bigger. He explained to me that he has had an abnormal echocardiogram that showed he may need a pacemaker. This was approximately 15 years ago. He also said that he has been given a motor to wear but he did not have any "flare ups" while he was wearing the monitor so it did not show any issues. He mentioned that he would like to have the referral sent to Dr [facility]). Can this be done as soon as possible?

## 2017-01-10 NOTE — Telephone Encounter (Signed)
Chart reviewed. Will send referral to cardiology.

## 2017-01-13 ENCOUNTER — Encounter: Payer: Self-pay | Admitting: Internal Medicine

## 2017-01-13 ENCOUNTER — Telehealth: Payer: Self-pay | Admitting: Internal Medicine

## 2017-01-13 ENCOUNTER — Encounter: Payer: Self-pay | Admitting: Pulmonary Disease

## 2017-01-13 DIAGNOSIS — J984 Other disorders of lung: Secondary | ICD-10-CM | POA: Diagnosis not present

## 2017-01-13 DIAGNOSIS — G4734 Idiopathic sleep related nonobstructive alveolar hypoventilation: Secondary | ICD-10-CM

## 2017-01-13 NOTE — Telephone Encounter (Signed)
Put into [facility] work queue They will contact pt

## 2017-01-13 NOTE — Telephone Encounter (Signed)
Pt request to see Dr. Annamaria Boots since he has been seen by him previously.

## 2017-01-13 NOTE — Telephone Encounter (Signed)
I have received the result of patient ONO - (overnight oximetry)  As I suspected, there is evidence for low oxygen at night.  This may be related to OSA or other lung problem.  We need to prescribed 2L home o2 qhs only (per [employer]), but also refer to pulmonary for possible sleep apnea  I will place orders; please let me know if pt is ok with this

## 2017-01-13 NOTE — Telephone Encounter (Signed)
Spoke with pt. He is wanting to schedule an appointment with CY. We have not seen him here in the office since 2015. He was offered CY's next available and declined.  KW - please advise where pt can be scheduled. Thanks.

## 2017-01-13 NOTE — Telephone Encounter (Signed)
Ok, I will forward to [facility] for [employer]

## 2017-01-13 NOTE — Telephone Encounter (Signed)
Pt does not want to proceed at this time until he finds out how much things will be. He wanted a copy of the report so I have mailed out a copy to his home.

## 2017-01-16 ENCOUNTER — Telehealth: Payer: Self-pay | Admitting: Internal Medicine

## 2017-01-16 NOTE — Telephone Encounter (Signed)
Katie please advise of an appt we can offer the pt.  Thanks

## 2017-01-16 NOTE — Telephone Encounter (Signed)
Patient states he was given a number to call for [facility] to get an oxygen tank based off of a sleep study he had done.   Patient states he called [facility] and a script had not been sent yet.  Please follow up with patient in regard.  Patient requesting urgent need for this.  Jonelle Sidle can you please check into this an d call patient today?

## 2017-01-16 NOTE — Telephone Encounter (Signed)
Spoke with pt, who states he will need to check with his wife to see if that is the week he will be out of town. Pt states he will call back in the morning to schedule.

## 2017-01-16 NOTE — Telephone Encounter (Signed)
Pt can be seen on August 22,2018 at 11:30am slot -please make it a consult for pulmonary concerns; CY has approved to see patient again. Thanks.

## 2017-01-16 NOTE — Telephone Encounter (Signed)
Per Kael/[facility],[place] office, order should be dispatched today and [facility] will call patient to advise what time o2 will be delivered to him today--patient advised

## 2017-01-17 NOTE — Telephone Encounter (Signed)
Spoke with RA, he is willing to see patient tomorrow. Pt has been scheduled with RA at 215. Pt is aware. Nothing else needed at time of call.

## 2017-01-17 NOTE — Telephone Encounter (Signed)
Patient stated he is not able to schedule on 02/01/17 at 11:30 with CY. Patient request to see another provider. I offered the next available for sleep consult. Patient request to be seen sooner.

## 2017-01-17 NOTE — Telephone Encounter (Signed)
Pt called and states [facility] has contacted him and the code for his Oxygen machine does not qualify for medicare to cover for the machine.  He would like a call back.   Dr. Jenny Reichmann referred him to Pulmonary and they cannot see him Until October, he would like his referral either changed to Urgent if Dr. Jenny Reichmann believes he should be seen sooner or he would like his referral sent somewhere else so he can be sent soone

## 2017-01-17 NOTE — Telephone Encounter (Signed)
Unfortunately, there is no applicable diagnosis.  Pt has been referred to Pulmonary

## 2017-01-17 NOTE — Telephone Encounter (Signed)
Pt has been scheduled with pulmonary on 8/8

## 2017-01-18 ENCOUNTER — Encounter: Payer: Self-pay | Admitting: Pulmonary Disease

## 2017-01-18 ENCOUNTER — Ambulatory Visit (INDEPENDENT_AMBULATORY_CARE_PROVIDER_SITE_OTHER): Payer: Medicare HMO | Admitting: Pulmonary Disease

## 2017-01-18 VITALS — BP 114/72 | HR 50 | Ht 69.0 in | Wt 191.0 lb

## 2017-01-18 DIAGNOSIS — G4734 Idiopathic sleep related nonobstructive alveolar hypoventilation: Secondary | ICD-10-CM | POA: Diagnosis not present

## 2017-01-19 ENCOUNTER — Telehealth: Payer: Self-pay | Admitting: Pulmonary Disease

## 2017-01-19 NOTE — Telephone Encounter (Signed)
Spoke with pt who was upset because its much later in the day and he could have done something sooner earlier in the day. Patient ended up saying thanks and hung up before I could apologize. Nothing further is needed

## 2017-01-19 NOTE — Telephone Encounter (Signed)
Since I do not have a diagnosis, cannot give him such a letter. He would have to get this from his PCP. Simply writing "excessive tiredness and fatigue" will not suffice

## 2017-01-19 NOTE — Assessment & Plan Note (Addendum)
He does not seem to have any significant cardiopulmonary disease to explain his mild nocturnal desaturation Given his unexplained and long-standing excessive daytime fatigue , reasonable to proceed with nocturnal polysomnogram, would prefer attended study's was also to evaluate for cardiac arrhythmias especially bradycardia arrhythmias  The pathophysiology of obstructive sleep apnea , it's cardiovascular consequences & modes of treatment including CPAP were discused with the patient in detail & they evidenced understanding.   I note that he is already due for cardiac evaluation, differential diagnosis here would be chronotropic incompetence . He does not seem to have hypothyroidism, and no risk factors for adrenal insufficiency  I really doubt that nocturnal oxygen is indicated here but if all other evaluation negative then we can consider that

## 2017-01-19 NOTE — Telephone Encounter (Signed)
Called and spoke with pt and he stated that he will fax the jury form to the triage fax.  He stated that this needs to be done today so the judge can review this tomorrow.  RA please advise if you are ok to complete the letter today.  Thanks

## 2017-01-19 NOTE — Progress Notes (Signed)
Subjective:    Patient ID: Daniel Reyes, male    DOB: 08/17/1945, 71 y.o.   MRN: [id number]  HPI   71 year old man referred for sleep evaluation due to finding of an abnormal nocturnal oximetry. He is accompanied by his wife who corroborates most of his history. The reports excessive daytime fatigue for at least 2 years. He had an extensive evaluation in 2016 including a negative Holter study. TSH has been normal as also an age-appropriate cancer screening. He is due for cardiac evaluation on 8/20  He was seen by primary care for an episode of cellulitis around his neck that was treated with clindamycin with good results. Due to his complaints of excessive tiredness and fatigue, nocturnal oximetry was obtained which showed 10.9 minutes of oxygen desaturation less than 88% with oxygen desaturation index of 25 over an 8.5 hours of monitoring, his average pulse was 54/m With lowest pulse documented as 47.  Epworth sleepiness score is 10 and he reports daily naps for about 1-2 hours. Sometimes going for about 3 hours. Bedtime is between 9:30 and 11 PM, sleep latency is minimal, sleeps on his sides with one pillow, reports 2-4 nocturnal awakenings including bathroom visits without any post void sleep latency and is out of bed by 8:30 AM at the latest feeling tired without dryness of mouth or headaches.  There is no history suggestive of cataplexy, sleep paralysis or parasomnias  He is a lifetime never smoker  Past Medical History:  Diagnosis Date  . Hyperlipemia   . Nocturnal hypoxia      Past Surgical History:  Procedure Laterality Date  . CHOLECYSTECTOMY OPEN  1998  . gerd surgery    . NECK SURGERY  1983    No Known Allergies    Social History   Social History  . Marital status: Married    Spouse name: N/A  . Number of children: 3  . Years of education: N/A   Occupational History  . retired-owner of [employer] History Main Topics  . Smoking  status: Never Smoker  . Smokeless tobacco: Never Used  . Alcohol use Yes     Comment: very rarely  . Drug use: No  . Sexual activity: Not on file   Other Topics Concern  . Not on file   Social History Narrative  . No narrative on file      Family History  Problem Relation Age of Onset  . Heart attack Father   . Colon cancer Father   . Stroke Brother   . Atrial fibrillation Brother   . Asthma Brother   . Alzheimer's disease Mother       Review of Systems  Constitutional: negative for anorexia, fevers and sweats  Eyes: negative for irritation, redness and visual disturbance  Ears, nose, mouth, throat, and face: negative for earaches, epistaxis, nasal congestion and sore throat  Respiratory: negative for cough, dyspnea on exertion, sputum and wheezing  Cardiovascular: negative for chest pain, dyspnea, lower extremity edema, orthopnea, palpitations and syncope  Gastrointestinal: negative for abdominal pain, constipation, diarrhea, melena, nausea and vomiting  Genitourinary:negative for dysuria, frequency and hematuria  Hematologic/lymphatic: negative for bleeding, easy bruising and lymphadenopathy  Musculoskeletal:negative for arthralgias, muscle weakness and stiff joints  Neurological: negative for coordination problems, gait problems, headaches and weakness  Endocrine: negative for diabetic symptoms including polydipsia, polyuria and weight loss     Objective:   Physical Exam Gen. Pleasant, well-nourished, in no distress, normal affect  ENT - no lesions, no post nasal drip Neck: No JVD, no thyromegaly, no carotid bruits Lungs: no use of accessory muscles, no dullness to percussion, clear without rales or rhonchi  Cardiovascular: Rhythm regular, heart sounds  normal, no murmurs or gallops, no peripheral edema Abdomen: soft and non-tender, no hepatosplenomegaly, BS normal. Musculoskeletal: No deformities, no cyanosis or clubbing Neuro:  alert, non focal          Assessment & Plan:

## 2017-01-30 ENCOUNTER — Other Ambulatory Visit: Payer: Self-pay | Admitting: Internal Medicine

## 2017-01-30 ENCOUNTER — Encounter: Payer: Self-pay | Admitting: Interventional Cardiology

## 2017-01-30 ENCOUNTER — Ambulatory Visit (INDEPENDENT_AMBULATORY_CARE_PROVIDER_SITE_OTHER): Payer: Medicare HMO | Admitting: Interventional Cardiology

## 2017-01-30 ENCOUNTER — Telehealth: Payer: Self-pay | Admitting: Internal Medicine

## 2017-01-30 VITALS — BP 114/82 | HR 60 | Ht 69.0 in | Wt 186.6 lb

## 2017-01-30 DIAGNOSIS — A4902 Methicillin resistant Staphylococcus aureus infection, unspecified site: Secondary | ICD-10-CM

## 2017-01-30 DIAGNOSIS — R5383 Other fatigue: Secondary | ICD-10-CM | POA: Diagnosis not present

## 2017-01-30 DIAGNOSIS — R0602 Shortness of breath: Secondary | ICD-10-CM

## 2017-01-30 DIAGNOSIS — E782 Mixed hyperlipidemia: Secondary | ICD-10-CM | POA: Diagnosis not present

## 2017-01-30 DIAGNOSIS — I4510 Unspecified right bundle-branch block: Secondary | ICD-10-CM | POA: Diagnosis not present

## 2017-01-30 MED ORDER — SULFAMETHOXAZOLE-TRIMETHOPRIM 800-160 MG PO TABS: 1.0000 | ORAL_TABLET | Freq: Two times a day (BID) | ORAL | 0 refills | Status: AC

## 2017-01-30 NOTE — Telephone Encounter (Signed)
RX sent

## 2017-01-30 NOTE — Progress Notes (Signed)
Cardiology Office Note   Date:  01/30/2017   ID:  Trexton, Escamilla [birth year], MRN [id number]  PCP:  Hoyt Koch, MD    No chief complaint on file. fatigue   Wt Readings from Last 3 Encounters:  01/30/17 186 lb 9.6 oz (84.6 kg)  01/18/17 191 lb (86.6 kg)  01/05/17 192 lb (87.1 kg)       History of Present Illness: Daniel Reyes is a 71 y.o. male who is being seen today for the evaluation of abnormal ECG and daytime fatigue at the request of [place], FNP.  Fatigue has been worse over the past few weeks.  Sx started in 2014.  Sx have gotten more frequent.  He had a sleep evaluation showing some decrease in his oxygen saturations during sleep intermittently. He has been evaluated by pulmonary who is considering nocturnal oxygen.  Of note, he had a negative outpatient rhythm monitoring study in 2016.    In July 2018, he had trouble walking while in [facility] on vacation.  It is worse when walking up steps.  He gets shortness of breath when walking up even a small [place] or walking up steps. He has had to stop at times. No chest pain or pressure. There has been some shoulder pain noted, near the back of his neck.   He was on a cruise and was sleeping a lot.    He does fly fish in the appropriate seasons.   He had an infection in his neck requiring antibiotics.  He was treated with clindamycin.   He had a cardiology eval 10 years ago.  He was told he may need a pacemaker at some point in his life. He thinks he had a treadmill test at that time and passed with flying colors. He has not had a cardiology evaluation since that time.  He has some knee pain with walking up stairs.   Father had an MI in his 48s.  His brother had a stroke caused by AFib.  No early CAD in the family. No h/o smoking.   He has been on nighttime oxygen for the past few weeks but theree has been no change in his energy level.   He has some lightheadedness when he goes from sitting to standing  multiple times in church. He admits to not drinking much water or trying to stay hydrated.      Past Medical History:  Diagnosis Date  . Hyperlipemia   . Nocturnal hypoxia     Past Surgical History:  Procedure Laterality Date  . CHOLECYSTECTOMY OPEN  1998  . gerd surgery    . NECK SURGERY  1983     No current outpatient prescriptions on file.   No current facility-administered medications for this visit.     Allergies:   Patient has no known allergies.    Social History:  The patient  reports that he has never smoked. He has never used smokeless tobacco. He reports that he drinks alcohol. He reports that he does not use drugs.   Family History:  The patient's family history includes Alzheimer's disease in his mother; Asthma in his brother; Atrial fibrillation in his brother; Colon cancer in his father; Heart attack in his father; Stroke in his brother.    ROS:  Please see the history of present illness.   Otherwise, review of systems are positive for fatigue, SHOB.   All other systems are reviewed and negative.    PHYSICAL  EXAM: VS:  BP 114/82   Pulse 60   Ht 5\' 9"  (1.753 m)   Wt 186 lb 9.6 oz (84.6 kg)   SpO2 98%   BMI 27.56 kg/m  , BMI Body mass index is 27.56 kg/m. GEN: Well nourished, well developed, in no acute distress  HEENT: normal  Neck: no JVD, carotid bruits, or masses Cardiac: RRR; no murmurs, rubs, or gallops,no edema  Respiratory:  clear to auscultation bilaterally, normal work of breathing GI: soft, nontender, nondistended, + BS MS: no deformity or atrophy  Skin: warm and dry, small red spot on right chest- they say similar to start of prior infection Neuro:  Strength and sensation are intact Psych: euthymic mood, full affect   EKG:   The ekg ordered today demonstrates NSR, RBBB, no ST changes   Recent Labs: 01/05/2017: ALT 25; BUN 15; Creatinine, Ser 1.04; Hemoglobin 13.9; Platelets 277.0; Potassium 4.4; Sodium 135; TSH 1.62   Lipid  Panel    Component Value Date/Time   CHOL 243 (H) 01/30/2015 0955   TRIG 72.0 01/30/2015 0955   HDL 65.60 01/30/2015 0955   CHOLHDL 4 01/30/2015 0955   VLDL 14.4 01/30/2015 0955   LDLCALC 163 (H) 01/30/2015 0955     Other studies Reviewed: Additional studies/ records that were reviewed today with results demonstrating: 2016 Holter monitor reviewed showing no pathologic arrhythmias..   ASSESSMENT AND PLAN:  1. Fatigue/shortness of breath: Plan for echocardiogram to evaluate for any structural heart disease. We'll also plan for exercise treadmill test to evaluate exercise capacity. He has a baseline right bundle branch block which may interfere somewhat with detecting ischemia. I'm more curious about what his exercise capacity will be on a treadmill if any symptoms develop. If he is unable to achieve target heart rate, will consider some type of pharmacologic testing. If he has an early positive exercise treadmill test, then would have to consider coronary angiogram.  2. Right bundle branch block: Likely chronic. This may be the reason why he was told he may need a pacemaker in the future. Left axis deviation also present which may indicate bifascicular block. No clear signs at this time that he has an indication for pacemaker. Would like to see his heart rate response to exercise when he is on the treadmill. 3. Hyperlipidemia: LDL elevated in the past. Not on any medications at this time. This needs to be followed.   Current medicines are reviewed at length with the patient today.  The patient concerns regarding his medicines were addressed.  The following changes have been made:  No change  Labs/ tests ordered today include: as above No orders of the defined types were placed in this encounter.   Recommend 150 minutes/week of aerobic exercise Low fat, low carb, high fiber diet recommended  Disposition:   FU for testing   Signed, Larae Grooms, MD  01/30/2017 10:12 AM    [facility] [place], [place], [place]  [address] Phone: [phone]; Fax: [phone]

## 2017-01-30 NOTE — Telephone Encounter (Signed)
Patient advised that [facility]actrim has been [facility]---take all meds even if he starts feeling better

## 2017-01-30 NOTE — Patient Instructions (Signed)
Medication Instructions:  Your physician recommends that you continue on your current medications as directed. Please refer to the Current Medication list given to you today.   Labwork: None ordered  Testing/Procedures: Your physician has requested that you have an exercise tolerance test. For further information please visit [email or web address]. Please also follow instruction sheet, as given.  Your physician has requested that you have an echocardiogram. Echocardiography is a painless test that uses sound waves to create images of your heart. It provides your doctor with information about the size and shape of your heart and how well your heart's chambers and valves are working. This procedure takes approximately one hour. There are no restrictions for this procedure.  Follow-Up: Based on test results  Any Other Special Instructions Will Be Listed Below (If Applicable).     If you need a refill on your cardiac medications before your next appointment, please call your pharmacy.

## 2017-01-30 NOTE — Telephone Encounter (Signed)
Pt called stating that he was seen by Dr Jenny Reichmann and was diagnosed with MRSA. He said that he was prescribed something to clear it up but now he has another spot towards his right side. They are leaving to go out of town tomorrow and and he wanted to know if something could be prescribed and sent to [facility] [place].

## 2017-01-30 NOTE — Telephone Encounter (Signed)
Routing to dr jones, please advise, thanks 

## 2017-01-31 ENCOUNTER — Other Ambulatory Visit: Payer: Self-pay

## 2017-01-31 ENCOUNTER — Ambulatory Visit (HOSPITAL_COMMUNITY): Payer: Medicare HMO | Attending: Cardiovascular Disease

## 2017-01-31 ENCOUNTER — Ambulatory Visit (INDEPENDENT_AMBULATORY_CARE_PROVIDER_SITE_OTHER): Payer: Medicare HMO

## 2017-01-31 DIAGNOSIS — R0602 Shortness of breath: Secondary | ICD-10-CM | POA: Insufficient documentation

## 2017-01-31 DIAGNOSIS — E785 Hyperlipidemia, unspecified: Secondary | ICD-10-CM | POA: Insufficient documentation

## 2017-01-31 LAB — EXERCISE TOLERANCE TEST
CHL CUP RESTING HR STRESS: 60 {beats}/min
CHL CUP STRESS STAGE 2 GRADE: 0 %
CHL CUP STRESS STAGE 2 HR: 63 {beats}/min
CHL CUP STRESS STAGE 2 SPEED: 1 mph
CHL CUP STRESS STAGE 3 GRADE: 0 %
CHL CUP STRESS STAGE 3 HR: 63 {beats}/min
CHL CUP STRESS STAGE 4 SBP: 145 mmHg
CHL CUP STRESS STAGE 5 SPEED: 2.5 mph
CHL CUP STRESS STAGE 6 DBP: 45 mmHg
CHL CUP STRESS STAGE 6 GRADE: 0 %
CHL CUP STRESS STAGE 6 SPEED: 0 mph
CHL CUP STRESS STAGE 7 DBP: 70 mmHg
CHL CUP STRESS STAGE 7 GRADE: 0 %
CHL CUP STRESS STAGE 7 HR: 69 {beats}/min
CHL CUP STRESS STAGE 7 SBP: 138 mmHg
Estimated workload: 6.3 METS
Exercise duration (min): 4 min
Exercise duration (sec): 26 s
MPHR: 149 {beats}/min
Peak HR: 93 {beats}/min
Percent HR: 64 %
Percent of predicted max HR: 62 %
RPE: 16
Stage 1 DBP: 65 mmHg
Stage 1 Grade: 0 %
Stage 1 HR: 64 {beats}/min
Stage 1 SBP: 116 mmHg
Stage 1 Speed: 0 mph
Stage 3 Speed: 1 mph
Stage 4 DBP: 55 mmHg
Stage 4 Grade: 10 %
Stage 4 HR: 85 {beats}/min
Stage 4 Speed: 1.7 mph
Stage 5 Grade: 12 %
Stage 5 HR: 93 {beats}/min
Stage 6 HR: 85 {beats}/min
Stage 6 SBP: 86 mmHg
Stage 7 Speed: 0 mph

## 2017-02-02 ENCOUNTER — Other Ambulatory Visit: Payer: Self-pay

## 2017-02-02 MED ORDER — ISOSORBIDE MONONITRATE ER 30 MG PO TB24: 30.0000 mg | ORAL_TABLET | Freq: Every day | ORAL | 3 refills | Status: DC

## 2017-02-03 ENCOUNTER — Telehealth: Payer: Self-pay

## 2017-02-03 DIAGNOSIS — R5383 Other fatigue: Secondary | ICD-10-CM

## 2017-02-03 DIAGNOSIS — R0602 Shortness of breath: Secondary | ICD-10-CM

## 2017-02-03 NOTE — Telephone Encounter (Signed)
Called to make patient aware that he has been scheduled for a right and left heart cath on 02/14/17 with Dr. Irish Lack at 10:30 AM. Damaris Schooner with patient's wife (DPR on file). Wife made aware that the patient needs to arrive at 8:00 AM. Reviewed Cath instructions. Patient scheduled for labs on 02/08/17. Instruction letter left up front to be picked up at lab appointment. Wife verbalized understanding and thanked me for the call.

## 2017-02-07 ENCOUNTER — Telehealth: Payer: Self-pay | Admitting: Interventional Cardiology

## 2017-02-07 NOTE — Telephone Encounter (Signed)
New message   Pt wife is calling to speak with [place] about her husband's Emdur. He is coming tomorrow for lab work and has heart cath scheduled for the 4th. He called her downstairs and said something is going on with him and he feels sick. She requests a call back with what to do.

## 2017-02-07 NOTE — Telephone Encounter (Signed)
Patient calling and states that he started taking the imdur 30 mg QD 2 days ago. The patient says that he started having symptoms yesterday which have worsened today. He states that he feels like he has to breathe deep to catch his breath and that he has been nauseous, constipated, pale, and has had slight sweating. He says that his thighs felt like they were tingling. Patient denies having any chest pain, lightheadedness, or dizziness. Patient did not have any vitals to offer. Patient states that he is finishing up abx. Patient scheduled to come in for labs tomorrow and is scheduled for a cath with Dr. Irish Lack on 9/4. Discussed with Dr. Irish Lack and made the patient aware that his recommendations were to stop taking the imdur since it is making him feel worse. Advised for the patient to let us know if his symptoms did not improve.

## 2017-02-08 ENCOUNTER — Observation Stay (HOSPITAL_COMMUNITY)
Admission: EM | Admit: 2017-02-08 | Discharge: 2017-02-10 | Disposition: A | Discharge status: Home / Self Care | Payer: Medicare HMO | Attending: Cardiovascular Disease | Admitting: Cardiovascular Disease

## 2017-02-08 ENCOUNTER — Other Ambulatory Visit: Payer: Self-pay

## 2017-02-08 ENCOUNTER — Other Ambulatory Visit: Payer: Medicare HMO | Admitting: *Deleted

## 2017-02-08 ENCOUNTER — Emergency Department (HOSPITAL_COMMUNITY): Payer: Medicare HMO

## 2017-02-08 ENCOUNTER — Encounter (HOSPITAL_COMMUNITY): Payer: Self-pay | Admitting: *Deleted

## 2017-02-08 DIAGNOSIS — I9589 Other hypotension: Secondary | ICD-10-CM | POA: Insufficient documentation

## 2017-02-08 DIAGNOSIS — R079 Chest pain, unspecified: Secondary | ICD-10-CM | POA: Diagnosis not present

## 2017-02-08 DIAGNOSIS — E785 Hyperlipidemia, unspecified: Secondary | ICD-10-CM

## 2017-02-08 DIAGNOSIS — R0602 Shortness of breath: Secondary | ICD-10-CM | POA: Diagnosis not present

## 2017-02-08 DIAGNOSIS — I200 Unstable angina: Secondary | ICD-10-CM

## 2017-02-08 DIAGNOSIS — I25110 Atherosclerotic heart disease of native coronary artery with unstable angina pectoris: Principal | ICD-10-CM | POA: Insufficient documentation

## 2017-02-08 DIAGNOSIS — Z7982 Long term (current) use of aspirin: Secondary | ICD-10-CM | POA: Insufficient documentation

## 2017-02-08 DIAGNOSIS — R0609 Other forms of dyspnea: Secondary | ICD-10-CM | POA: Diagnosis not present

## 2017-02-08 DIAGNOSIS — I4510 Unspecified right bundle-branch block: Secondary | ICD-10-CM | POA: Insufficient documentation

## 2017-02-08 DIAGNOSIS — Z8249 Family history of ischemic heart disease and other diseases of the circulatory system: Secondary | ICD-10-CM | POA: Diagnosis not present

## 2017-02-08 DIAGNOSIS — R0600 Dyspnea, unspecified: Secondary | ICD-10-CM

## 2017-02-08 DIAGNOSIS — D509 Iron deficiency anemia, unspecified: Secondary | ICD-10-CM | POA: Insufficient documentation

## 2017-02-08 DIAGNOSIS — R5383 Other fatigue: Secondary | ICD-10-CM | POA: Diagnosis not present

## 2017-02-08 HISTORY — DX: Atherosclerotic heart disease of native coronary artery without angina pectoris: I25.10

## 2017-02-08 LAB — CBC WITH DIFFERENTIAL/PLATELET
BASOS PCT: 1 %
Basophils Absolute: 0 10*3/uL (ref 0.0–0.1)
Eosinophils Absolute: 0.2 10*3/uL (ref 0.0–0.7)
Eosinophils Relative: 5 %
HEMATOCRIT: 35.2 % — AB (ref 39.0–52.0)
Hemoglobin: 12.4 g/dL — ABNORMAL LOW (ref 13.0–17.0)
Lymphocytes Relative: 39 %
Lymphs Abs: 1.8 10*3/uL (ref 0.7–4.0)
MCH: 27.4 pg (ref 26.0–34.0)
MCHC: 35.2 g/dL (ref 30.0–36.0)
MCV: 77.7 fL — AB (ref 78.0–100.0)
MONO ABS: 0.4 10*3/uL (ref 0.1–1.0)
MONOS PCT: 10 %
NEUTROS ABS: 2.1 10*3/uL (ref 1.7–7.7)
Neutrophils Relative %: 45 %
Platelets: 185 10*3/uL (ref 150–400)
RBC: 4.53 MIL/uL (ref 4.22–5.81)
RDW: 13.5 % (ref 11.5–15.5)
WBC: 4.7 10*3/uL (ref 4.0–10.5)

## 2017-02-08 LAB — TROPONIN I: Troponin I: <0.03 ng/mL (ref <0.03)

## 2017-02-08 LAB — I-STAT TROPONIN, ED: TROPONIN I, POC: 0.02 ng/mL (ref 0.00–0.08)

## 2017-02-08 LAB — BASIC METABOLIC PANEL
Anion gap: 9 (ref 5–15)
BUN: 10 mg/dL (ref 6–20)
CALCIUM: 9.4 mg/dL (ref 8.9–10.3)
CHLORIDE: 107 mmol/L (ref 101–111)
CO2: 17 mmol/L — ABNORMAL LOW (ref 22–32)
CREATININE: 1.14 mg/dL (ref 0.61–1.24)
GFR calc Af Amer: >60 mL/min (ref >60)
Glucose, Bld: 93 mg/dL (ref 65–99)
Potassium: 3.8 mmol/L (ref 3.5–5.1)
SODIUM: 133 mmol/L — AB (ref 135–145)

## 2017-02-08 LAB — HEMOGLOBIN A1C
HEMOGLOBIN A1C: 5.6 % (ref 4.8–5.6)
Mean Plasma Glucose: 114.02 mg/dL

## 2017-02-08 LAB — PROTIME-INR
INR: 1.05
PROTHROMBIN TIME: 13.6 s (ref 11.4–15.2)

## 2017-02-08 LAB — TSH: TSH: 1.437 u[IU]/mL (ref 0.350–4.500)

## 2017-02-08 IMAGING — CR DG CHEST 2V
2 series · 2 of 2 positions shown · non-contrast
Comparison: Radiographs [DATE].

CLINICAL DATA: Chest pain, shortness of breath.

EXAM:
CHEST  2 VIEW

[chest pa]
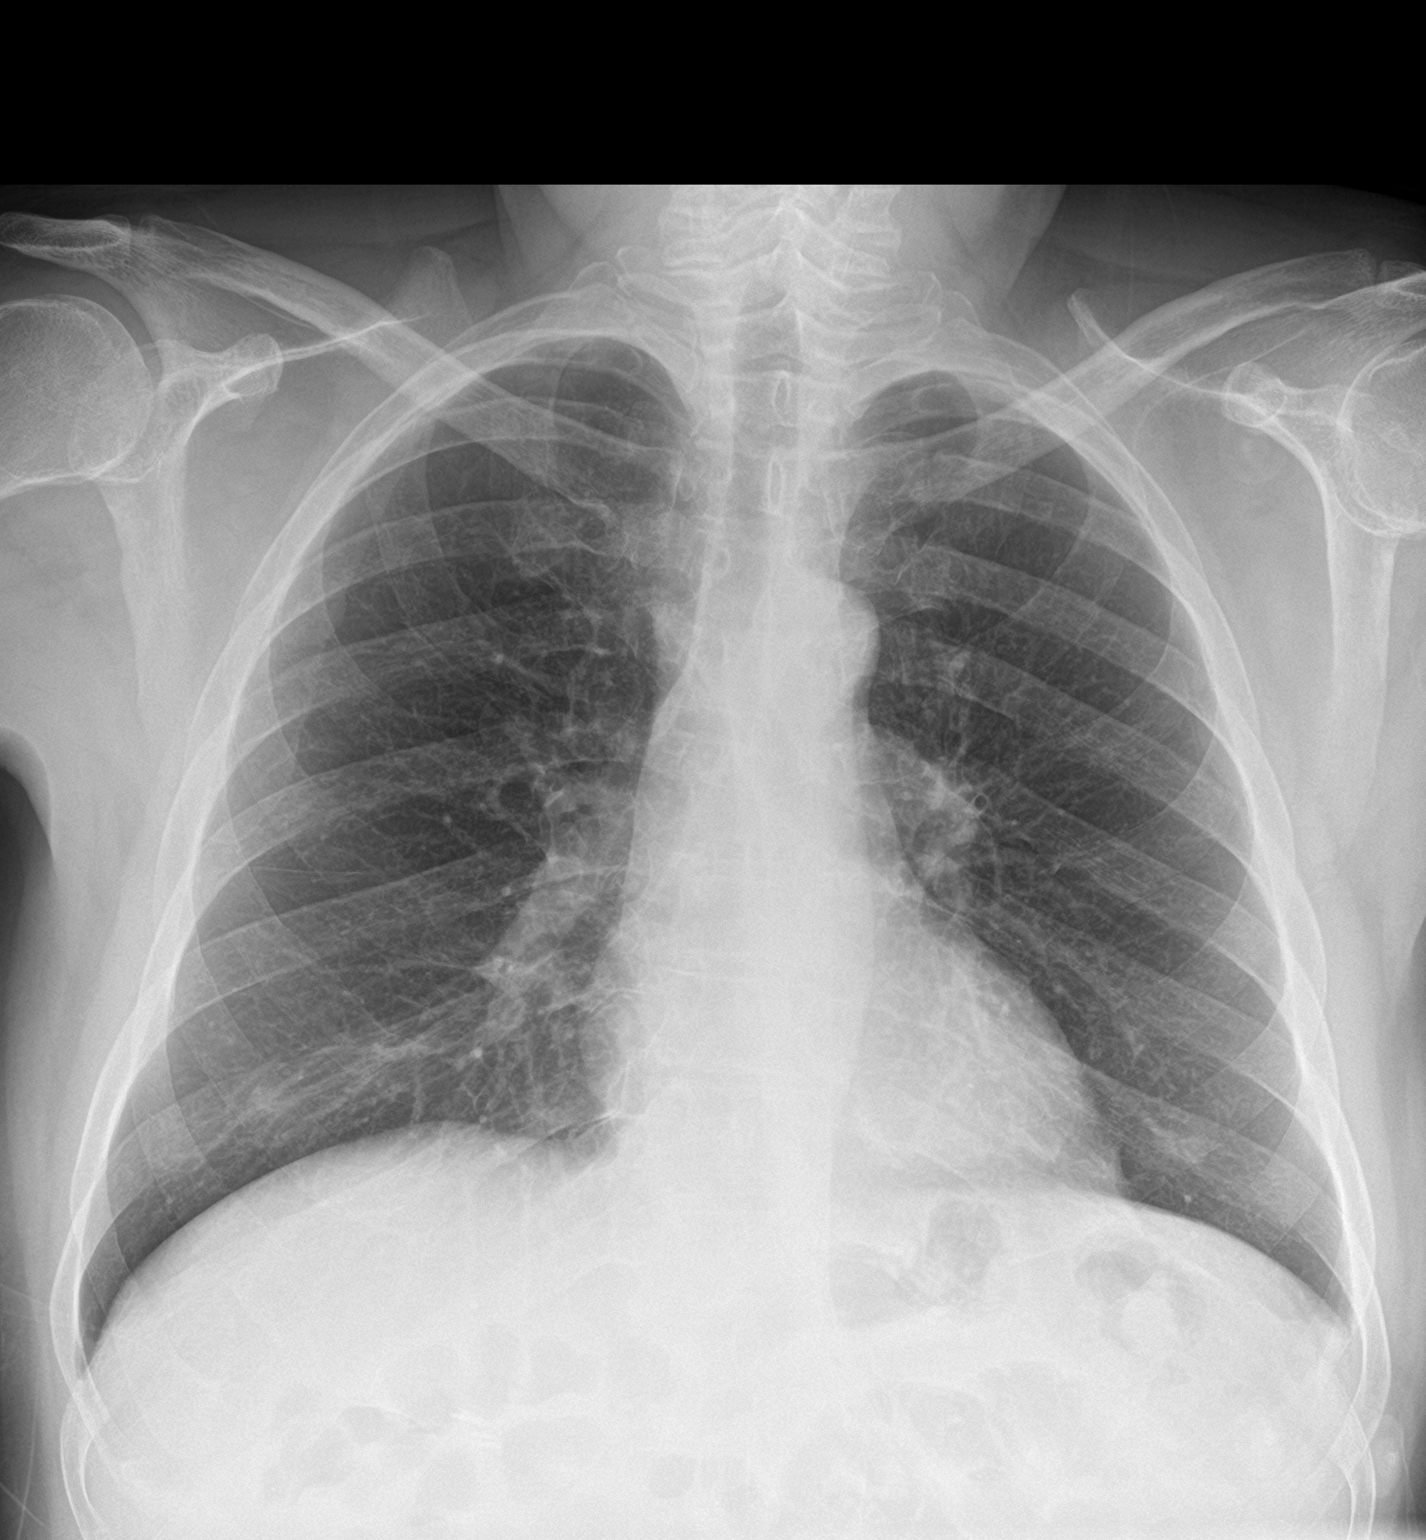

[chest lat]
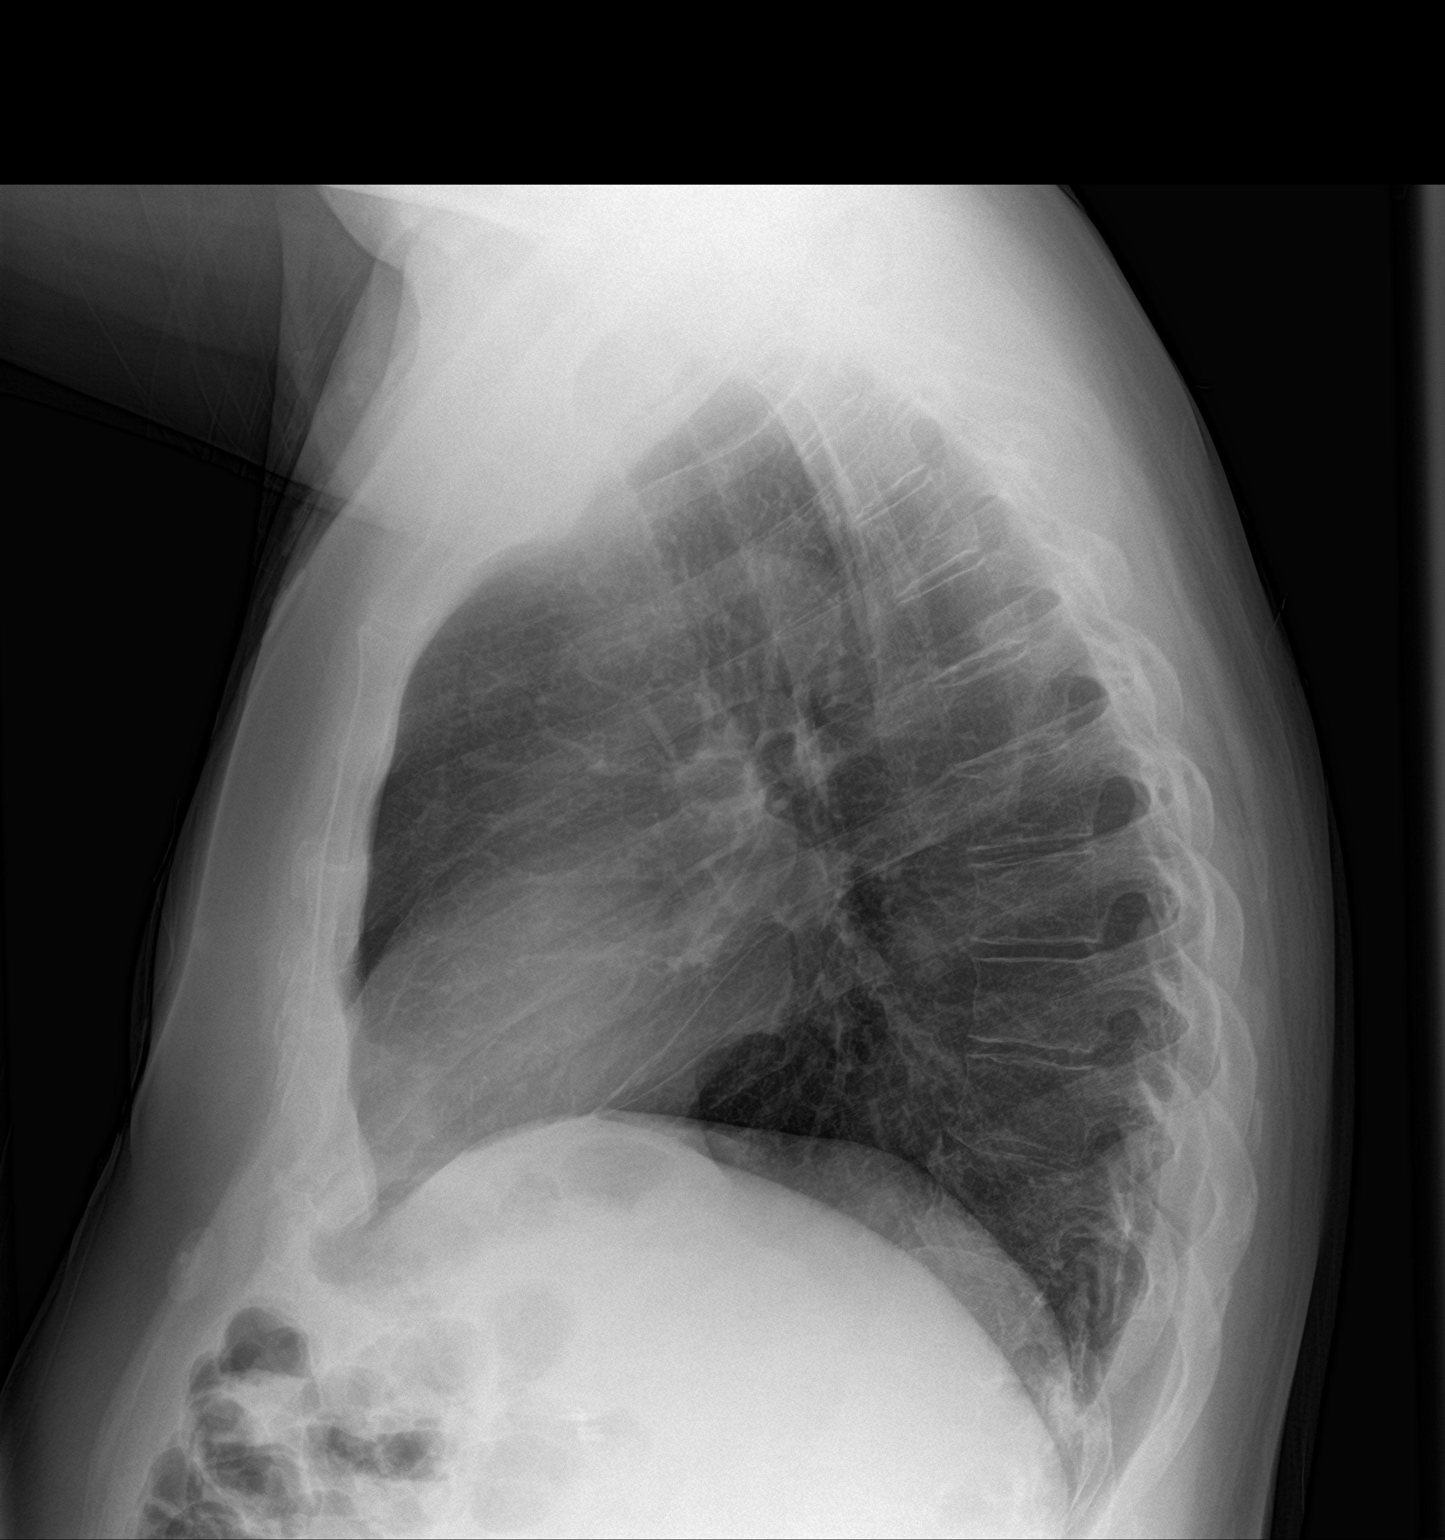

[2 of 2 positions shown; findings below may reference images not displayed]

FINDINGS: The heart size and mediastinal contours are within normal limits.
Both lungs are clear. No pneumothorax or pleural effusion is noted.
The visualized skeletal structures are unremarkable.
IMPRESSION: No active cardiopulmonary disease.

## 2017-02-08 MED ORDER — ATORVASTATIN CALCIUM 80 MG PO TABS
80.0000 mg | ORAL_TABLET | Freq: Every day | ORAL | Status: DC
  Administered 2017-02-08 – 2017-02-09 (×2): 80 mg via ORAL
  Filled 2017-02-08 (×3): qty 1

## 2017-02-08 MED ORDER — ASPIRIN EC 81 MG PO TBEC
81.0000 mg | DELAYED_RELEASE_TABLET | Freq: Every day | ORAL | Status: DC
  Administered 2017-02-10: 81 mg via ORAL
  Filled 2017-02-08 (×2): qty 1

## 2017-02-08 MED ORDER — HEPARIN BOLUS VIA INFUSION
4000.0000 [IU] | Freq: Once | INTRAVENOUS | Status: AC
  Administered 2017-02-08: 4000 [IU] via INTRAVENOUS
  Filled 2017-02-08: qty 4000

## 2017-02-08 MED ORDER — ASPIRIN 81 MG PO CHEW
81.0000 mg | CHEWABLE_TABLET | ORAL | Status: AC
  Administered 2017-02-09: 81 mg via ORAL
  Filled 2017-02-08: qty 1

## 2017-02-08 MED ORDER — SODIUM CHLORIDE 0.9% FLUSH: 3.0000 mL | INTRAVENOUS | Status: DC | PRN

## 2017-02-08 MED ORDER — SODIUM CHLORIDE 0.9 % WEIGHT BASED INFUSION
1.0000 mL/kg/h | INTRAVENOUS | Status: DC
  Administered 2017-02-09: 1 mL/kg/h via INTRAVENOUS

## 2017-02-08 MED ORDER — AMLODIPINE BESYLATE 5 MG PO TABS
2.5000 mg | ORAL_TABLET | Freq: Every day | ORAL | Status: DC
  Filled 2017-02-08 (×2): qty 1

## 2017-02-08 MED ORDER — HEPARIN (PORCINE) IN NACL 100-0.45 UNIT/ML-% IJ SOLN
1300.0000 [IU]/h | INTRAMUSCULAR | Status: DC
  Administered 2017-02-08 – 2017-02-09 (×2): 1300 [IU]/h via INTRAVENOUS
  Filled 2017-02-08 (×2): qty 250

## 2017-02-08 MED ORDER — SODIUM CHLORIDE 0.9 % IV SOLN: 250.0000 mL | INTRAVENOUS | Status: DC | PRN

## 2017-02-08 MED ORDER — SODIUM CHLORIDE 0.9% FLUSH: 3.0000 mL | Freq: Two times a day (BID) | INTRAVENOUS | Status: DC

## 2017-02-08 MED ORDER — ACETAMINOPHEN 325 MG PO TABS
650.0000 mg | ORAL_TABLET | ORAL | Status: DC | PRN
  Filled 2017-02-08: qty 2

## 2017-02-08 MED ORDER — ONDANSETRON HCL 4 MG/2ML IJ SOLN: 4.0000 mg | Freq: Four times a day (QID) | INTRAMUSCULAR | Status: DC | PRN

## 2017-02-08 MED ORDER — ASPIRIN 325 MG PO TABS
325.0000 mg | ORAL_TABLET | Freq: Once | ORAL | Status: AC
  Administered 2017-02-08: 325 mg via ORAL
  Filled 2017-02-08: qty 1

## 2017-02-08 MED ORDER — SODIUM CHLORIDE 0.9 % WEIGHT BASED INFUSION
3.0000 mL/kg/h | INTRAVENOUS | Status: DC
  Administered 2017-02-09: 3 mL/kg/h via INTRAVENOUS

## 2017-02-08 NOTE — H&P (Signed)
Cardiology H&P:   Patient ID: CRIT OBREMSKI; [id number]; 1945/12/09   Admit date: 02/08/2017 Date of Consult: 02/08/2017  Primary Care Provider: Hoyt Koch, MD Primary Cardiologist: Dr. Irish Lack   Patient Profile:   Daniel Reyes is a 71 y.o. male with a hx of hyperlipidemia who is being seen today for the evaluation of chest pain at the request of Dr. Darl Householder.  History of Present Illness:   Mr. Daniel Reyes was recently seen by Dr. Irish Lack in our office on 01/30/17 for evaluation of abnormal ECG and daytime fatigue at the request of his PCP. His fatigue had been worsening over the previous few weeks. In 12/2016 he had trouble walking while in [facility] on vacation. He has DOE with walking up steps and walking even a small [place]. He also admitted to some shoulder pain near the back of his neck. He had an echocardiogram on 01/31/17 that showed normal LV function with EF 55-60%, no RWMA, grade 2 DD. He also had an exercise tolerance test on 01/31/17 that demonstrated poor exercise capacity and no evidence of ischemia by ECG analysis, but was a submaximal study. He was scheduled for outpatient cath on 02/14/17 and started on Imdur 66m. He then had worsening symptoms with dyspnea at rest, nausea and dizziness. The Imdur was stopped and he presented to the ED for the worsening symptoms.   Today the patient reports steady worsening of breathing and activity intolerance over the past 4 months. He used to be very active, however, he presented to the office today for pre-procedure lab work and was very symptomatic walking into office with dyspnea, nausea, lightheadedness and feeling like he just couldn't make it. He has had chest heaviness but no specific chest pain. His wife is very concerned about his progressive activity intolerance.  He had a cardiology eval 10 years ago and was told that he may need a pacemaker at some point in his life. He thinks that he had a treadmill test at that time and passed with flying  colors. He has no history of bleeding problems although he does bruise easily.   His father died of an MI in his 820's His brother had a stroke caused by afib. No early CAD in the family. He has no history of smoking.   He had a sleep evaluation showing decreased oxygen saturations during sleep and he has been on nocturnal oxygen for several weeks with no improvement in his symptoms.   Pertinent findings: Troponin 0.02 EKG NSR 64 bpm,  LAD, Low voltage QRS, RBBB SCr 1.14,   K+ 3.8,   Na+ 133,   Hgb  12.4 CXR:  No active cardiopulmonary disease TSH on 01/05/17:  1.62  Past Medical History:  Diagnosis Date  . Hyperlipemia   . Nocturnal hypoxia     Past Surgical History:  Procedure Laterality Date  . CHOLECYSTECTOMY OPEN  1998  . gerd surgery    . NECK SURGERY  1983     Home Medications:  Prior to Admission medications   Medication Sig Start Date End Date Taking? Authorizing Provider  acetaminophen (TYLENOL) 325 MG tablet Take 650 mg by mouth every 6 (six) hours as needed.   Yes [provider]  Aspirin-Salicylamide-Caffeine (BC HEADACHE PO) Take 1 packet by mouth daily as needed (headache).   Yes [provider]  isosorbide mononitrate (IMDUR) 30 MG 24 hr tablet Take 30 mg by mouth daily.   Yes [provider]  sulfamethoxazole-trimethoprim (BACTRIM DS,SEPTRA DS) 800-160  MG tablet Take 1 tablet by mouth 2 (two) times daily. 01/30/17  Yes [provider]    Inpatient Medications: Scheduled Meds: . aspirin  325 mg Oral Once   Continuous Infusions:  PRN Meds:   Allergies:    Allergies  Allergen Reactions  . Imdur [Isosorbide Dinitrate] Anaphylaxis and Nausea And Vomiting    Nose swelling with difficulty breathing thru nose    Social History:   Social History   Social History  . Marital status: Married    Spouse name: N/A  . Number of children: 3  . Years of education: N/A   Occupational History  . retired-owner of [employer] History Main Topics  . Smoking status: Never Smoker  . Smokeless tobacco: Never Used  . Alcohol use Yes     Comment: very rarely  . Drug use: No  . Sexual activity: Not on file   Other Topics Concern  . Not on file   Social History Narrative  . No narrative on file    Family History:    Family History  Problem Relation Age of Onset  . Heart attack Father   . Colon cancer Father   . Alzheimer's disease Mother   . Stroke Brother   . Atrial fibrillation Brother   . Asthma Brother      ROS:  Please see the history of present illness.  ROS  All other ROS reviewed and negative.     Physical Exam/Data:   Vitals:   02/08/17 1027 02/08/17 1050 02/08/17 1215 02/08/17 1230  BP: 114/76  (!) 152/86 (!) 151/77  Pulse: 62   (!) 58  Resp: _0 Temp: 98.2 F (36.8 C)     TempSrc: Oral     SpO2: 100%   100%  Weight:  186 lb (84.4 kg)     No intake or output data in the 24 hours ending 02/08/17 1347 Filed Weights   02/08/17 1050  Weight: 186 lb (84.4 kg)   Body mass index is 27.47 kg/m.  General:  Well nourished, well developed, in no acute distress HEENT: normal Lymph: no adenopathy Neck: no JVD Endocrine:  No thryomegaly Vascular: No carotid bruits; FA pulses 2+ bilaterally without bruits  Cardiac:  normal S1, S2; RRR; no murmur  Lungs:  clear to auscultation bilaterally, no wheezing, rhonchi or rales  Abd: soft, nontender, no hepatomegaly  Ext: no edema Musculoskeletal:  No deformities, BUE and BLE strength normal and equal Skin: warm and dry  Neuro:  CNs 2-12 intact, no focal abnormalities noted Psych:  Normal affect   EKG:  The EKG was personally reviewed and demonstrates:  NSR 64 bpm,  LAD, Low voltage QRS, RBBB Telemetry:  Telemetry was personally reviewed and demonstrates:  NSR at 60 bpm  Relevant CV Studies:  Echocardiogram 01/31/17 Study Conclusions  - Left ventricle: The cavity size was normal. Wall thickness was    normal. Systolic function was normal. The estimated ejection   fraction was in the range of 55% to 60%. Wall motion was normal;   there were no regional wall motion abnormalities. Features are   consistent with a pseudonormal left ventricular filling pattern,   with concomitant abnormal relaxation and increased filling   pressure (grade 2 diastolic dysfunction).  Exercise tolerance test  01/31/17 Study Highlights    Blood pressure demonstrated a hypotensive response to exercise.  There was no ST segment deviation noted during stress. 1. Submaximal study.  2. No evidence for ischemia by ECG analysis but submaximal.      Laboratory Data:  Chemistry Recent Labs Lab 02/08/17 1111  NA 133*  K 3.8  CL 107  CO2 17*  GLUCOSE 93  BUN 10  CREATININE 1.14  CALCIUM 9.4  GFRNONAA >60  GFRAA >60  ANIONGAP 9    No results for input(s): PROT, ALBUMIN, AST, ALT, ALKPHOS, BILITOT in the last 168 hours. Hematology Recent Labs Lab 02/08/17 1245  WBC 4.7  RBC 4.53  HGB 12.4*  HCT 35.2*  MCV 77.7*  MCH 27.4  MCHC 35.2  RDW 13.5  PLT 185   Cardiac EnzymesNo results for input(s): TROPONINI in the last 168 hours.  Recent Labs Lab 02/08/17 1122  TROPIPOC 0.02    BNPNo results for input(s): BNP, PROBNP in the last 168 hours.  DDimer No results for input(s): DDIMER in the last 168 hours.  Lipid Panel     Component Value Date/Time   CHOL 243 (H) 01/30/2015 0955   TRIG 72.0 01/30/2015 0955   HDL 65.60 01/30/2015 0955   CHOLHDL 4 01/30/2015 0955   VLDL 14.4 01/30/2015 0955   LDLCALC 163 (H) 01/30/2015 0955   Radiology/Studies:  Dg Chest 2 View  Result Date: 02/08/2017 CLINICAL DATA:  Chest pain, shortness of breath. EXAM: CHEST  2 VIEW COMPARISON:  Radiographs of January 05, 2017. FINDINGS: The heart size and mediastinal contours are within normal limits. Both lungs are clear. No pneumothorax or pleural effusion is noted. The visualized skeletal structures are unremarkable.  IMPRESSION: No active cardiopulmonary disease. Electronically Signed   By: Marijo Conception, M.D.   On: 02/08/2017 11:28    Assessment and Plan:   Chest pain -Pt with several week history of fatigue and shortness of breath. Seen in the office on 8/21 and echo and stress test ordered. Echo showed normal LV systolic function and grade 2 DD. Exercise stress test was suboptimal and pt had poor exercise tolerance. He was scheduled for a L/R heart cath on 9/4. He was also started on Imdur which he did not tolerate. He is here for worsening shortness of breath, chest heaviness, nausea, dizziness, most pronounced this morning. No current chest discomfort.  -1st troponin negative -Normal CXR -Normal kidney function with SCr 1.14 -CVD risk factors include hyperlipidemia -Continue to trend cardiac enzymes -Suspicious for unstable angina -Start systemic heparin -Will arrange for left and right heart cath tomorrow for definitive cardiac evaluation. Can bump up today if worsening symptoms or troponins positive. -Heart rate is running in upper 50's to 60. No BB at present. Will start low dose amlodipine for antianginal effect. -Will check lipid panel and Hgb A1c for CVD risk stratification  Hyperlipidemia -Most recent lipid panel in 01/2015: LDL 163. Pt on no meds.  -Will need lipid therapy. Start atorvastatin 80 mg daily -Will update lipid panel for risk stratification  Signed, Daune Perch, NP  02/08/2017 1:47 PM  Patient seen and examined. Agree with assessment and plan.  Mr. Santo Zahradnik is a 70 year old Caucasian male who has a history of untreated hyperlipidemia and recently has experienced progressive decline in exertional capacity with exertional shortness of breath and a sensation of chest fullness.  He was recently seen by Dr. Irish Lack last week for cardiology evaluation.  An echo Doppler study showed normal LV function with an EF of 55-60% without regional wall motion abnormalities and grade 2  diastolic dysfunction.  He was referred for routine treadmill test which was abnormal in  that he had poor exercise tolerance and exercise for only 4 minutes, corresponding to a 6 met workload.  Reportedly, his blood pressure drop with exercise.  He did not have diagnostic ECG changes.  He was empirically given a prescription for isosorbide 30 mg, which he only took 2 doses and then several days later noticed nausea and vomiting and stopped taking the medication.  Of note, 2016, his lipids were significantly elevated with LDL 163.  He states he took medication for very short-term, but stopped taking this.  He has not had any follow-up lipid evaluation that he is aware of.  Over the past week he has noticed progressive decline in exertional capacity with significant exertional dyspnea.  He has been previously tentatively scheduled to have a right and left heart catheterization next week.  Today because of increasing symptomatology.  He presented to the emergency room for evaluation.  Presently, he is without chest tightness but admits that he has experienced a chest pressure with walking.  His blood pressure is stable.  His pulse is around 60.  His male pattern alopecia.  Mild potty skeletal 3.  HEENT is otherwise unremarkable.  He did not have carotid bruits.  There was no wheezing or rales.  He did not have chest wall tenderness to palpation.  Rhythm was regular with a faint 1/6 systolic murmur.  There was no S3 gallop.  He had mild central adiposity.  Pulses were 2+.  He did not have clubbing cyanosis or edema.  Neurologic exam was nonfocal.  His ECG shows sinus rhythm at 64 bpm with right bundle branch block with repolarization changes.  Initial troponin is negative.  He has mild microcytic indices with an MCV of 77, and mild anemia.  Sodium is mildly decreased at 133.  Renal function is normal.  Had a long discussion with the patient and his wife.  I'm concerned with his progressive change in symptomatology.   Previously he admits to being fairly active, did fly fishing fairly often and was able to walk without difficulty.  With his progressive symptomatology, I have recommended definitive cardiac catheterization.  I will schedule him for a right and left heart cardiac catheterization study to be done tomorrow.  I will initiate heparin anticoagulation.  in the event of potential unstable symptomatology. I have reviewed the risks, indications, and alternatives to cardiac catheterization, possible angioplasty, and stenting with the patient. Risks include but are not limited to bleeding, infection, vascular injury, stroke, myocardial infection, arrhythmia, kidney injury, radiation-related injury in the case of prolonged fluoroscopy use, emergency cardiac surgery, and death. The patient understands the risks of serious complication is 1-2 in 1103 with diagnostic cardiac cath and 1-2% or less with angioplasty/stenting.  He has had some episodes of bradycardia.  He was intolerant to nitrate therapy.  Will initiate low-dose amlodipine 2.5 mg for potential anti-ischemic benefit.  Will also initiate high potency statin therapy.   Troy Sine, MD, [facility] 02/08/2017 4:41 PM

## 2017-02-08 NOTE — ED Notes (Addendum)
Call to 4E

## 2017-02-08 NOTE — Progress Notes (Signed)
Assumed patient at 1815; patient alert & oriented; denies CP; Denies SOB; patient NSR; heparin bolus given and drip started prior to shift changed; bed in lowest position; call bell in reach

## 2017-02-08 NOTE — ED Notes (Signed)
Unable to collect CBC.

## 2017-02-08 NOTE — ED Provider Notes (Signed)
[facility] DEPT Provider Note   CSN: [id number] Arrival date & time: 02/08/17  1014     History   Chief Complaint Chief Complaint  Patient presents with  . Chest Pain  . Shortness of Breath  . Dizziness    HPI Daniel Reyes is a 71 y.o. male history of hyperlipidemia, who presenting with chest pain, shortness of breath. Patient states that he has progressive chest pain and shortness of breath with exertion over the last several weeks. It is especially worse when he goes up the [place]. He was seen by cardiology recently and had an echo that showed nl EF and type 2 diastolic dysfunction. He was unable to tolerate the stress test and is currently scheduled for cardiac catheterization next week. He was prescribed imdur but unable to tolerate it. Has worsening shortness of breath with exertion so came for evaluation.   The history is provided by the patient.    Past Medical History:  Diagnosis Date  . Hyperlipemia   . Nocturnal hypoxia     Patient Active Problem List   Diagnosis Date Noted  . Nocturnal hypoxia   . Fatigue 01/05/2017  . Hypersomnolence 01/05/2017  . Cellulitis 01/05/2017  . Routine general medical examination at a health care facility 06/01/2015  . Dizziness and giddiness 04/14/2015  . Chest x-ray abnormality 04/13/2015  . RBBB 04/13/2015  . Hyperlipidemia 01/31/2015    Past Surgical History:  Procedure Laterality Date  . CHOLECYSTECTOMY OPEN  1998  . gerd surgery    . [place] Medications    Prior to Admission medications   Not on File    Family History Family History  Problem Relation Age of Onset  . Heart attack Father   . Colon cancer Father   . Alzheimer's disease Mother   . Stroke Brother   . Atrial fibrillation Brother   . Asthma Brother     Social History Social History  Substance Use Topics  . Smoking status: Never Smoker  . Smokeless tobacco: Never Used  . Alcohol use Yes     Comment: very rarely      Allergies   Patient has no known allergies.   Review of Systems Review of Systems  Respiratory: Positive for shortness of breath.   Cardiovascular: Positive for chest pain.  Neurological: Positive for dizziness.  All other systems reviewed and are negative.    Physical Exam Updated Vital Signs BP (!) 152/86   Pulse 62   Temp 98.2 F (36.8 C) (Oral)   Resp 13   Wt 84.4 kg (186 lb)   SpO2 100%   BMI 27.47 kg/m   Physical Exam  Constitutional:  Chronically ill   HENT:  Head: Normocephalic.  Mouth/Throat: Oropharynx is clear and moist.  Eyes: Pupils are equal, round, and reactive to light. Conjunctivae and EOM are normal.  Neck: Normal range of motion. Neck supple.  Cardiovascular: Normal rate, regular rhythm and normal heart sounds.   Pulmonary/Chest: Effort normal and breath sounds normal. No respiratory distress. He has no wheezes. He has no rales.  Abdominal: Soft. Bowel sounds are normal. He exhibits no distension. There is no tenderness. There is no guarding.  Musculoskeletal: Normal range of motion. He exhibits no edema.  Neurological: He is alert.  Skin: Skin is warm.  Psychiatric: He has a normal mood and affect.  Nursing note and vitals reviewed.    ED Treatments / Results  Labs (all labs ordered  are listed, but only abnormal results are displayed) Labs Reviewed  BASIC METABOLIC PANEL - Abnormal; Notable for the following:       Result Value   Sodium 133 (*)    CO2 17 (*)    All other components within normal limits  CBC WITH DIFFERENTIAL/PLATELET  I-STAT TROPONIN, ED    EKG  EKG Interpretation  Date/Time:  Wednesday February 08 2017 10:22:41 EDT Ventricular Rate:  64 PR Interval:  214 QRS Duration: 118 QT Interval:  472 QTC Calculation: 486 R Axis:   -56 Text Interpretation:  Sinus rhythm with 1st degree A-V block Left axis deviation Low voltage QRS Right bundle branch block Abnormal ECG No previous ECGs available Confirmed by Wandra Arthurs [phone]) on 02/08/2017 11:58:32 AM       Radiology Dg Chest 2 View  Result Date: 02/08/2017 CLINICAL DATA:  Chest pain, shortness of breath. EXAM: CHEST  2 VIEW COMPARISON:  Radiographs of January 05, 2017. FINDINGS: The heart size and mediastinal contours are within normal limits. Both lungs are clear. No pneumothorax or pleural effusion is noted. The visualized skeletal structures are unremarkable. IMPRESSION: No active cardiopulmonary disease. Electronically Signed   By: Marijo Conception, M.D.   On: 02/08/2017 11:28    Procedures Procedures (including critical care time)  Medications Ordered in ED Medications  aspirin tablet 325 mg (not administered)     Initial Impression / Assessment and Plan / ED Course  I have reviewed the triage vital signs and the nursing notes.  Pertinent labs & imaging results that were available during my care of the patient were reviewed by me and considered in my medical decision making (see chart for details).     Daniel Reyes is a 71 y.o. male here with worsening shortness of breath with exertion. Concerned for unstable angina given worsening symptoms. Unable to tolerate stress test recently. Will consult cardiology regarding catheterization.   4:04 PM Cardiology to admit and perform cath tomorrow.   Final Clinical Impressions(s) / ED Diagnoses   Final diagnoses:  None    New Prescriptions New Prescriptions   No medications on file     Drenda Freeze, MD 02/08/17 [phone]

## 2017-02-08 NOTE — ED Triage Notes (Signed)
Pt reports not feeling well x 6 weeks. Has been to pcp and had tests done. Was unable to complete stress test due to weakness, dizziness and hypotension. Reports chest heaviness and sob started yesterday. Has generalized fatigue, lack of appetite.

## 2017-02-08 NOTE — Progress Notes (Signed)
ANTICOAGULATION CONSULT NOTE - Initial Consult  Pharmacy Consult for heparin Indication: chest pain/ACS  Allergies  Allergen Reactions  . Imdur [Isosorbide Dinitrate] Anaphylaxis and Nausea And Vomiting    Nose swelling with difficulty breathing thru nose    Patient Measurements: Weight: 186 lb (84.4 kg) Heparin Dosing Weight: 84  Vital Signs: Temp: 98.1 F (36.7 C) (08/29 1815) Temp Source: Oral (08/29 1815) BP: 128/84 (08/29 1815) Pulse Rate: 68 (08/29 1815)  Labs:  Recent Labs  02/08/17 0949 02/08/17 1111 02/08/17 1245  HGB WILL FOLLOW  --  12.4*  HCT WILL FOLLOW  --  35.2*  PLT WILL FOLLOW  --  185  LABPROT 11.2  --   --   INR 1.1  --   --   CREATININE 1.13 1.14  --     Estimated Creatinine Clearance: 60.3 mL/min (by C-G formula based on SCr of 1.14 mg/dL).   Medical History: Past Medical History:  Diagnosis Date  . Hyperlipemia   . Nocturnal hypoxia     Medications:  Prescriptions Prior to Admission  Medication Sig Dispense Refill Last Dose  . acetaminophen (TYLENOL) 325 MG tablet Take 650 mg by mouth every 6 (six) hours as needed.   Past Week at Unknown time  . Aspirin-Salicylamide-Caffeine (BC HEADACHE PO) Take 1 packet by mouth daily as needed (headache).   Past Week at Unknown time  . isosorbide mononitrate (IMDUR) 30 MG 24 hr tablet Take 30 mg by mouth daily.   Past Week at Unknown time  . sulfamethoxazole-trimethoprim (BACTRIM DS,SEPTRA DS) 800-160 MG tablet Take 1 tablet by mouth 2 (two) times daily.   Past Week at Unknown time    Assessment: Daniel Reyes is a 71 y.o. male with a hx of hyperlipidemia who is being seen today for the evaluation of chest pain. Pharmacy consulted to start heparin for ACS. Plan for R/L heart cath tomorrow.   Goal of Therapy:  Heparin level 0.3-0.7 units/ml Monitor platelets by anticoagulation protocol: Yes   Plan:  Give 4000 units bolus x 1 Start heparin infusion at 1300 units/hr Check anti-Xa level in 8 hours  and daily while on heparin Continue to monitor H&H and platelets   Thank you for allowing [place] to participate in this patients care.  Jens Som, PharmD Clinical phone for 02/08/2017 from 3:30-10:30p: [phone] If after 10:30p, please call main pharmacy at: [phone] 02/08/2017 7:05 PM

## 2017-02-09 ENCOUNTER — Encounter (HOSPITAL_COMMUNITY)
Admission: EM | Disposition: A | Discharge status: Home / Self Care | Payer: Self-pay | Source: Home / Self Care | Attending: Emergency Medicine

## 2017-02-09 ENCOUNTER — Ambulatory Visit (HOSPITAL_COMMUNITY): Admit: 2017-02-09 | Payer: Medicare HMO | Admitting: Interventional Cardiology

## 2017-02-09 ENCOUNTER — Encounter (HOSPITAL_COMMUNITY): Payer: Self-pay | Admitting: Interventional Cardiology

## 2017-02-09 DIAGNOSIS — D509 Iron deficiency anemia, unspecified: Secondary | ICD-10-CM | POA: Diagnosis not present

## 2017-02-09 DIAGNOSIS — I200 Unstable angina: Secondary | ICD-10-CM | POA: Diagnosis not present

## 2017-02-09 DIAGNOSIS — I4510 Unspecified right bundle-branch block: Secondary | ICD-10-CM | POA: Diagnosis not present

## 2017-02-09 DIAGNOSIS — E785 Hyperlipidemia, unspecified: Secondary | ICD-10-CM | POA: Diagnosis not present

## 2017-02-09 DIAGNOSIS — I2510 Atherosclerotic heart disease of native coronary artery without angina pectoris: Secondary | ICD-10-CM

## 2017-02-09 DIAGNOSIS — I25110 Atherosclerotic heart disease of native coronary artery with unstable angina pectoris: Secondary | ICD-10-CM | POA: Diagnosis not present

## 2017-02-09 DIAGNOSIS — R0600 Dyspnea, unspecified: Secondary | ICD-10-CM

## 2017-02-09 DIAGNOSIS — Z8249 Family history of ischemic heart disease and other diseases of the circulatory system: Secondary | ICD-10-CM | POA: Diagnosis not present

## 2017-02-09 DIAGNOSIS — R0609 Other forms of dyspnea: Secondary | ICD-10-CM | POA: Diagnosis not present

## 2017-02-09 DIAGNOSIS — Z7982 Long term (current) use of aspirin: Secondary | ICD-10-CM | POA: Diagnosis not present

## 2017-02-09 DIAGNOSIS — I9589 Other hypotension: Secondary | ICD-10-CM | POA: Diagnosis not present

## 2017-02-09 HISTORY — PX: RIGHT/LEFT HEART CATH AND CORONARY ANGIOGRAPHY: CATH118266

## 2017-02-09 HISTORY — PX: CARDIAC CATHETERIZATION: SHX172

## 2017-02-09 LAB — BASIC METABOLIC PANEL
ANION GAP: 8 (ref 5–15)
BUN / CREAT RATIO: 9 — AB (ref 10–24)
BUN: 10 mg/dL (ref 8–27)
BUN: 13 mg/dL (ref 6–20)
CHLORIDE: 105 mmol/L (ref 101–111)
CO2: 17 mmol/L — AB (ref 20–29)
CO2: 21 mmol/L — AB (ref 22–32)
CREATININE: 1.13 mg/dL (ref 0.76–1.27)
Calcium: 9.4 mg/dL (ref 8.6–10.2)
Calcium: 8.9 mg/dL (ref 8.9–10.3)
Chloride: 102 mmol/L (ref 96–106)
Creatinine, Ser: 1.32 mg/dL — ABNORMAL HIGH (ref 0.61–1.24)
GFR calc non Af Amer: 53 mL/min — ABNORMAL LOW (ref >60)
GFR, EST AFRICAN AMERICAN: 76 mL/min/{1.73_m2} (ref >59)
GFR, EST NON AFRICAN AMERICAN: 65 mL/min/{1.73_m2} (ref >59)
GLUCOSE: 99 mg/dL (ref 65–99)
Glucose: 95 mg/dL (ref 65–99)
POTASSIUM: 4.2 mmol/L (ref 3.5–5.2)
Potassium: 4.2 mmol/L (ref 3.5–5.1)
SODIUM: 134 mmol/L (ref 134–144)
Sodium: 134 mmol/L — ABNORMAL LOW (ref 135–145)

## 2017-02-09 LAB — PROTIME-INR
INR: 1.1 (ref 0.8–1.2)
PROTHROMBIN TIME: 11.2 s (ref 9.1–12.0)

## 2017-02-09 LAB — CBC
HEMATOCRIT: 36.9 % — AB (ref 37.5–51.0)
HEMOGLOBIN: 12.7 g/dL — AB (ref 13.0–17.7)
MCH: 27.7 pg (ref 26.6–33.0)
MCHC: 34.4 g/dL (ref 31.5–35.7)
MCV: 81 fL (ref 79–97)
Platelets: 196 10*3/uL (ref 150–379)
RBC: 4.58 x10E6/uL (ref 4.14–5.80)
RDW: 14.5 % (ref 12.3–15.4)
WBC: 4 10*3/uL (ref 3.4–10.8)

## 2017-02-09 LAB — POCT I-STAT 3, ART BLOOD GAS (G3+)
Acid-base deficit: 6 mmol/L — ABNORMAL HIGH (ref 0.0–2.0)
Bicarbonate: 18.3 mmol/L — ABNORMAL LOW (ref 20.0–28.0)
O2 SAT: 98 %
PH ART: 7.385 (ref 7.350–7.450)
PO2 ART: 96 mmHg (ref 83.0–108.0)
TCO2: 19 mmol/L — ABNORMAL LOW (ref 22–32)
pCO2 arterial: 30.6 mmHg — ABNORMAL LOW (ref 32.0–48.0)

## 2017-02-09 LAB — POCT I-STAT 3, VENOUS BLOOD GAS (G3P V)
ACID-BASE DEFICIT: 6 mmol/L — AB (ref 0.0–2.0)
BICARBONATE: 19.2 mmol/L — AB (ref 20.0–28.0)
O2 SAT: 66 %
TCO2: 20 mmol/L — ABNORMAL LOW (ref 22–32)
pCO2, Ven: 34.8 mmHg — ABNORMAL LOW (ref 44.0–60.0)
pH, Ven: 7.35 (ref 7.250–7.430)
pO2, Ven: 35 mmHg (ref 32.0–45.0)

## 2017-02-09 LAB — LIPID PANEL
CHOLESTEROL: 223 mg/dL — AB (ref 0–200)
HDL: 31 mg/dL — ABNORMAL LOW (ref >40)
LDL Cholesterol: 153 mg/dL — ABNORMAL HIGH (ref 0–99)
TRIGLYCERIDES: 193 mg/dL — AB (ref <150)
Total CHOL/HDL Ratio: 7.2 RATIO
VLDL: 39 mg/dL (ref 0–40)

## 2017-02-09 LAB — HEPARIN LEVEL (UNFRACTIONATED): HEPARIN UNFRACTIONATED: 0.66 [IU]/mL (ref 0.30–0.70)

## 2017-02-09 LAB — TROPONIN I: Troponin I: <0.03 ng/mL (ref <0.03)

## 2017-02-09 SURGERY — RIGHT/LEFT HEART CATH AND CORONARY ANGIOGRAPHY
Anesthesia: LOCAL

## 2017-02-09 MED ORDER — HEPARIN SODIUM (PORCINE) 1000 UNIT/ML IJ SOLN
INTRAMUSCULAR | Status: DC | PRN
  Administered 2017-02-09: 4500 [IU] via INTRAVENOUS

## 2017-02-09 MED ORDER — ACETAMINOPHEN 325 MG PO TABS
650.0000 mg | ORAL_TABLET | ORAL | Status: DC | PRN
  Administered 2017-02-09: 650 mg via ORAL

## 2017-02-09 MED ORDER — FENTANYL CITRATE (PF) 100 MCG/2ML IJ SOLN
INTRAMUSCULAR | Status: DC | PRN
  Administered 2017-02-09: 50 ug via INTRAVENOUS

## 2017-02-09 MED ORDER — SODIUM CHLORIDE 0.9% FLUSH
3.0000 mL | Freq: Two times a day (BID) | INTRAVENOUS | Status: DC
  Administered 2017-02-10: 3 mL via INTRAVENOUS

## 2017-02-09 MED ORDER — VERAPAMIL HCL 2.5 MG/ML IV SOLN
INTRAVENOUS | Status: DC | PRN
  Administered 2017-02-09: 10 mL via INTRA_ARTERIAL

## 2017-02-09 MED ORDER — IOPAMIDOL (ISOVUE-370) INJECTION 76%
INTRAVENOUS | Status: AC
Start: 2017-02-09 — End: 2017-02-09
  Filled 2017-02-09: qty 100

## 2017-02-09 MED ORDER — SODIUM CHLORIDE 0.9 % IV SOLN
INTRAVENOUS | Status: AC
  Administered 2017-02-09: 12:00:00 via INTRAVENOUS

## 2017-02-09 MED ORDER — SODIUM CHLORIDE 0.9 % IV SOLN: 250.0000 mL | INTRAVENOUS | Status: DC | PRN

## 2017-02-09 MED ORDER — SODIUM CHLORIDE 0.9% FLUSH: 3.0000 mL | INTRAVENOUS | Status: DC | PRN

## 2017-02-09 MED ORDER — FENTANYL CITRATE (PF) 100 MCG/2ML IJ SOLN
INTRAMUSCULAR | Status: AC
  Filled 2017-02-09: qty 2

## 2017-02-09 MED ORDER — MIDAZOLAM HCL 2 MG/2ML IJ SOLN
INTRAMUSCULAR | Status: AC
Start: 2017-02-09 — End: 2017-02-09
  Filled 2017-02-09: qty 2

## 2017-02-09 MED ORDER — LIDOCAINE HCL (PF) 1 % IJ SOLN
INTRAMUSCULAR | Status: DC | PRN
  Administered 2017-02-09: 5 mL via SUBCUTANEOUS

## 2017-02-09 MED ORDER — IOPAMIDOL (ISOVUE-370) INJECTION 76%
INTRAVENOUS | Status: DC | PRN
  Administered 2017-02-09: 35 mL via INTRA_ARTERIAL

## 2017-02-09 MED ORDER — ONDANSETRON HCL 4 MG/2ML IJ SOLN: 4.0000 mg | Freq: Four times a day (QID) | INTRAMUSCULAR | Status: DC | PRN

## 2017-02-09 MED ORDER — HEPARIN SODIUM (PORCINE) 1000 UNIT/ML IJ SOLN
INTRAMUSCULAR | Status: AC
  Filled 2017-02-09: qty 1

## 2017-02-09 MED ORDER — OXYCODONE HCL 5 MG PO TABS: 5.0000 mg | ORAL_TABLET | ORAL | Status: DC | PRN

## 2017-02-09 MED ORDER — MIDAZOLAM HCL 2 MG/2ML IJ SOLN
INTRAMUSCULAR | Status: DC | PRN
  Administered 2017-02-09 (×2): 1 mg via INTRAVENOUS

## 2017-02-09 MED ORDER — VERAPAMIL HCL 2.5 MG/ML IV SOLN
INTRAVENOUS | Status: AC
  Filled 2017-02-09: qty 2

## 2017-02-09 MED ORDER — LIDOCAINE HCL (PF) 1 % IJ SOLN
INTRAMUSCULAR | Status: AC
  Filled 2017-02-09: qty 30

## 2017-02-09 SURGICAL SUPPLY — 14 items
CATH BALLN WEDGE 5F 110CM (CATHETERS) ×2 IMPLANT
CATH EXPO 5F FL3.5 (CATHETERS) ×2 IMPLANT
CATH INFINITI JR4 5F (CATHETERS) ×2 IMPLANT
COVER PRB 48X5XTLSCP FOLD TPE (BAG) ×1 IMPLANT
COVER PROBE 5X48 (BAG) ×1
DEVICE RAD COMP TR BAND LRG (VASCULAR PRODUCTS) ×2 IMPLANT
GLIDESHEATH SLEND A-KIT 6F 22G (SHEATH) ×2 IMPLANT
GUIDEWIRE INQWIRE 1.5J.035X260 (WIRE) ×1 IMPLANT
INQWIRE 1.5J .035X260CM (WIRE) ×2
KIT HEART LEFT (KITS) ×2 IMPLANT
PACK CARDIAC CATHETERIZATION (CUSTOM PROCEDURE TRAY) ×2 IMPLANT
SHEATH GLIDE SLENDER 4/5FR (SHEATH) ×2 IMPLANT
TRANSDUCER W/STOPCOCK (MISCELLANEOUS) ×2 IMPLANT
TUBING CIL FLEX 10 FLL-RA (TUBING) ×2 IMPLANT

## 2017-02-09 NOTE — Progress Notes (Signed)
TR band removed per protocol.  Site unremarkable.  Small pressure guaze and tegaderm applied.  Will con't plan of care

## 2017-02-09 NOTE — Progress Notes (Signed)
Progress Note  Patient Name: Daniel Reyes Date of Encounter: 02/09/2017  Primary Cardiologist: Dr. Irish Lack  Subjective   No recurrent chest pain.  Inpatient Medications    Scheduled Meds: . amLODipine  2.5 mg Oral Daily  . aspirin EC  81 mg Oral Daily  . atorvastatin  80 mg Oral q1800  . sodium chloride flush  3 mL Intravenous Q12H   Continuous Infusions: . sodium chloride    . sodium chloride 1 mL/kg/hr (02/09/17 0646)  . heparin 1,300 Units/hr (02/08/17 1916)   PRN Meds: sodium chloride, acetaminophen, ondansetron (ZOFRAN) IV, sodium chloride flush   Vital Signs    Vitals:   02/09/17 0300 02/09/17 0400 02/09/17 0500 02/09/17 0509  BP:    124/62  Pulse: (!) 57 (!) 57 (!) 59 (!) 57  Resp: _0 Temp:    98.2 F (36.8 C)  TempSrc:    Oral  SpO2: 95% 98% 100% 99%  Weight:    185 lb 1.6 oz (84 kg)  Height:        Intake/Output Summary (Last 24 hours) at 02/09/17 1029 Last data filed at 02/09/17 0606  Gross per 24 hour  Intake           677.44 ml  Output              550 ml  Net           127.44 ml    I/O since admission:   Filed Weights   02/08/17 1050 02/08/17 1815 02/09/17 0509  Weight: 186 lb (84.4 kg) 187 lb 9.6 oz (85.1 kg) 185 lb 1.6 oz (84 kg)    Telemetry    Sinus - Personally Reviewed  ECG    ECG (independently read by me): NSR at 56; RBBB  Physical Exam   BP 124/62 (BP Location: Left Arm)   Pulse (!) 57   Temp 98.2 F (36.8 C) (Oral)   Resp 13   Ht _1  (1.753 m)   Wt 185 lb 1.6 oz (84 kg)   SpO2 99%   BMI 27.33 kg/m  General: Alert, oriented, no distress.  Skin: normal turgor, no rashes, warm and dry HEENT: Normocephalic, atraumatic. Pupils equal round and reactive to light; sclera anicteric; extraocular muscles intact;  Nose without nasal septal hypertrophy Mouth/Parynx benign; Mallinpatti scale 3 Neck: No JVD, no carotid bruits; normal carotid upstroke Lungs: clear to ausculatation and percussion; no wheezing or  rales Chest wall: without tenderness to palpitation Heart: PMI not displaced, RRR, s1 s2 normal, 1/6 systolic murmur, no diastolic murmur, no rubs, gallops, thrills, or heaves Abdomen: soft, nontender; no hepatosplenomehaly, BS+; abdominal aorta nontender and not dilated by palpation. Back: no CVA tenderness Pulses 2+ Musculoskeletal: full range of motion, normal strength, no joint deformities Extremities: no clubbing cyanosis or edema, Homan's sign negative  Neurologic: grossly nonfocal; Cranial nerves grossly wnl Psychologic: Normal mood and affect     Labs    Chemistry Recent Labs Lab 02/08/17 0949 02/08/17 1111 02/09/17 0510  NA 134 133* 134*  K 4.2 3.8 4.2  CL 102 107 105  CO2 17* 17* 21*  GLUCOSE 95 93 99  BUN _2 CREATININE 1.13 1.14 1.32*  CALCIUM 9.4 9.4 8.9  GFRNONAA 65 >60 53*  GFRAA 76 >60 >60  ANIONGAP  --  9 8     Hematology Recent Labs Lab 02/08/17 0949 02/08/17 1245  WBC WILL FOLLOW 4.7  RBC WILL FOLLOW 4.53  HGB WILL FOLLOW 12.4*  HCT WILL FOLLOW 35.2*  MCV WILL FOLLOW 77.7*  MCH WILL FOLLOW 27.4  MCHC WILL FOLLOW 35.2  RDW WILL FOLLOW 13.5  PLT WILL FOLLOW 185    Hepatic Function Latest Ref Rng & Units 01/05/2017 01/30/2015  Total Protein 6.0 - 8.3 g/dL 7.2 7.7  Albumin 3.5 - 5.2 g/dL 4.1 4.3  AST 0 - 37 U/L 28 14  ALT 0 - 53 U/L 25 22  Alk Phosphatase 39 - 117 U/L 42 43  Total Bilirubin 0.2 - 1.2 mg/dL 0.6 0.9  Bilirubin, Direct 0.0 - 0.3 mg/dL 0.1 -   Cardiac Enzymes Recent Labs Lab 02/08/17 1850 02/09/17 0051 02/09/17 0510  TROPONINI <0.03 <0.03 <0.03    Recent Labs Lab 02/08/17 1122  TROPIPOC 0.02     BNPNo results for input(s): BNP, PROBNP in the last 168 hours.   DDimer No results for input(s): DDIMER in the last 168 hours.   Lipid Panel     Component Value Date/Time   CHOL 223 (H) 02/09/2017 0051   TRIG 193 (H) 02/09/2017 0051   HDL 31 (L) 02/09/2017 0051   CHOLHDL 7.2 02/09/2017 0051   VLDL 39  02/09/2017 0051   LDLCALC 153 (H) 02/09/2017 0051    Radiology    Dg Chest 2 View  Result Date: 02/08/2017 CLINICAL DATA:  Chest pain, shortness of breath. EXAM: CHEST  2 VIEW COMPARISON:  Radiographs of January 05, 2017. FINDINGS: The heart size and mediastinal contours are within normal limits. Both lungs are clear. No pneumothorax or pleural effusion is noted. The visualized skeletal structures are unremarkable. IMPRESSION: No active cardiopulmonary disease. Electronically Signed   By: Marijo Conception, M.D.   On: 02/08/2017 11:28    Cardiac Studies    Echocardiogram 01/31/17 Study Conclusions  - Left ventricle: The cavity size was normal. Wall thickness was normal. Systolic function was normal. The estimated ejection fraction was in the range of 55% to 60%. Wall motion was normal; there were no regional wall motion abnormalities. Features are consistent with a pseudonormal left ventricular filling pattern, with concomitant abnormal relaxation and increased filling pressure (grade 2 diastolic dysfunction).   Exercise tolerance test  01/31/17 Study Highlights    Blood pressure demonstrated a hypotensive response to exercise.  There was no ST segment deviation noted during stress. 1. Submaximal study.  2. No evidence for ischemia by ECG analysis but submaximal.       Patient Profile     71 y.o. male who has a hx of hyperlipidemia and recent increasing exertional dyspnea and exertional chest tightness.  Assessment & Plan    1. Progressive dyspnea and exertional chest pain.  No recurrent symptoms since admission on heparin and addition of amlodipine.  For R & L heart cath today.  2. Hyperlipidemia: f/u LDL 153; now on atorvastatin 80 mg.  3. Microcytic anemia: should check Fe studies  I have reviewed the risks, indications, and alternatives to cardiac catheterization, possible angioplasty, and stenting with the patient. Risks include but are not limited  to bleeding, infection, vascular injury, stroke, myocardial infection, arrhythmia, kidney injury, radiation-related injury in the case of prolonged fluoroscopy use, emergency cardiac surgery, and death. The patient understands the risks of serious complication is 1-2 in 4268 with diagnostic cardiac cath and 1-2% or less with angioplasty/stenting.  Plan today.  Signed, Troy Sine, MD, [facility] 02/09/2017, 10:29 AM

## 2017-02-09 NOTE — H&P (View-Only) (Signed)
Progress Note  Patient Name: Daniel Reyes Date of Encounter: 02/09/2017  Primary Cardiologist: Dr. Irish Lack  Subjective   No recurrent chest pain.  Inpatient Medications    Scheduled Meds: . amLODipine  2.5 mg Oral Daily  . aspirin EC  81 mg Oral Daily  . atorvastatin  80 mg Oral q1800  . sodium chloride flush  3 mL Intravenous Q12H   Continuous Infusions: . sodium chloride    . sodium chloride 1 mL/kg/hr (02/09/17 0646)  . heparin 1,300 Units/hr (02/08/17 1916)   PRN Meds: sodium chloride, acetaminophen, ondansetron (ZOFRAN) IV, sodium chloride flush   Vital Signs    Vitals:   02/09/17 0300 02/09/17 0400 02/09/17 0500 02/09/17 0509  BP:    124/62  Pulse: (!) 57 (!) 57 (!) 59 (!) 57  Resp: _0 Temp:    98.2 F (36.8 C)  TempSrc:    Oral  SpO2: 95% 98% 100% 99%  Weight:    185 lb 1.6 oz (84 kg)  Height:        Intake/Output Summary (Last 24 hours) at 02/09/17 1029 Last data filed at 02/09/17 0606  Gross per 24 hour  Intake           677.44 ml  Output              550 ml  Net           127.44 ml    I/O since admission:   Filed Weights   02/08/17 1050 02/08/17 1815 02/09/17 0509  Weight: 186 lb (84.4 kg) 187 lb 9.6 oz (85.1 kg) 185 lb 1.6 oz (84 kg)    Telemetry    Sinus - Personally Reviewed  ECG    ECG (independently read by me): NSR at 56; RBBB  Physical Exam   BP 124/62 (BP Location: Left Arm)   Pulse (!) 57   Temp 98.2 F (36.8 C) (Oral)   Resp 13   Ht _1  (1.753 m)   Wt 185 lb 1.6 oz (84 kg)   SpO2 99%   BMI 27.33 kg/m  General: Alert, oriented, no distress.  Skin: normal turgor, no rashes, warm and dry HEENT: Normocephalic, atraumatic. Pupils equal round and reactive to light; sclera anicteric; extraocular muscles intact;  Nose without nasal septal hypertrophy Mouth/Parynx benign; Mallinpatti scale 3 Neck: No JVD, no carotid bruits; normal carotid upstroke Lungs: clear to ausculatation and percussion; no wheezing or  rales Chest wall: without tenderness to palpitation Heart: PMI not displaced, RRR, s1 s2 normal, 1/6 systolic murmur, no diastolic murmur, no rubs, gallops, thrills, or heaves Abdomen: soft, nontender; no hepatosplenomehaly, BS+; abdominal aorta nontender and not dilated by palpation. Back: no CVA tenderness Pulses 2+ Musculoskeletal: full range of motion, normal strength, no joint deformities Extremities: no clubbing cyanosis or edema, Homan's sign negative  Neurologic: grossly nonfocal; Cranial nerves grossly wnl Psychologic: Normal mood and affect     Labs    Chemistry Recent Labs Lab 02/08/17 0949 02/08/17 1111 02/09/17 0510  NA 134 133* 134*  K 4.2 3.8 4.2  CL 102 107 105  CO2 17* 17* 21*  GLUCOSE 95 93 99  BUN _2 CREATININE 1.13 1.14 1.32*  CALCIUM 9.4 9.4 8.9  GFRNONAA 65 >60 53*  GFRAA 76 >60 >60  ANIONGAP  --  9 8     Hematology Recent Labs Lab 02/08/17 0949 02/08/17 1245  WBC WILL FOLLOW 4.7  RBC WILL FOLLOW 4.53  HGB WILL FOLLOW 12.4*  HCT WILL FOLLOW 35.2*  MCV WILL FOLLOW 77.7*  MCH WILL FOLLOW 27.4  MCHC WILL FOLLOW 35.2  RDW WILL FOLLOW 13.5  PLT WILL FOLLOW 185    Hepatic Function Latest Ref Rng & Units 01/05/2017 01/30/2015  Total Protein 6.0 - 8.3 g/dL 7.2 7.7  Albumin 3.5 - 5.2 g/dL 4.1 4.3  AST 0 - 37 U/L 28 14  ALT 0 - 53 U/L 25 22  Alk Phosphatase 39 - 117 U/L 42 43  Total Bilirubin 0.2 - 1.2 mg/dL 0.6 0.9  Bilirubin, Direct 0.0 - 0.3 mg/dL 0.1 -   Cardiac Enzymes Recent Labs Lab 02/08/17 1850 02/09/17 0051 02/09/17 0510  TROPONINI <0.03 <0.03 <0.03    Recent Labs Lab 02/08/17 1122  TROPIPOC 0.02     BNPNo results for input(s): BNP, PROBNP in the last 168 hours.   DDimer No results for input(s): DDIMER in the last 168 hours.   Lipid Panel     Component Value Date/Time   CHOL 223 (H) 02/09/2017 0051   TRIG 193 (H) 02/09/2017 0051   HDL 31 (L) 02/09/2017 0051   CHOLHDL 7.2 02/09/2017 0051   VLDL 39  02/09/2017 0051   LDLCALC 153 (H) 02/09/2017 0051    Radiology    Dg Chest 2 View  Result Date: 02/08/2017 CLINICAL DATA:  Chest pain, shortness of breath. EXAM: CHEST  2 VIEW COMPARISON:  Radiographs of January 05, 2017. FINDINGS: The heart size and mediastinal contours are within normal limits. Both lungs are clear. No pneumothorax or pleural effusion is noted. The visualized skeletal structures are unremarkable. IMPRESSION: No active cardiopulmonary disease. Electronically Signed   By: Marijo Conception, M.D.   On: 02/08/2017 11:28    Cardiac Studies    Echocardiogram 01/31/17 Study Conclusions  - Left ventricle: The cavity size was normal. Wall thickness was normal. Systolic function was normal. The estimated ejection fraction was in the range of 55% to 60%. Wall motion was normal; there were no regional wall motion abnormalities. Features are consistent with a pseudonormal left ventricular filling pattern, with concomitant abnormal relaxation and increased filling pressure (grade 2 diastolic dysfunction).   Exercise tolerance test  01/31/17 Study Highlights    Blood pressure demonstrated a hypotensive response to exercise.  There was no ST segment deviation noted during stress. 1. Submaximal study.  2. No evidence for ischemia by ECG analysis but submaximal.       Patient Profile     71 y.o. male who has a hx of hyperlipidemia and recent increasing exertional dyspnea and exertional chest tightness.  Assessment & Plan    1. Progressive dyspnea and exertional chest pain.  No recurrent symptoms since admission on heparin and addition of amlodipine.  For R & L heart cath today.  2. Hyperlipidemia: f/u LDL 153; now on atorvastatin 80 mg.  3. Microcytic anemia: should check Fe studies  I have reviewed the risks, indications, and alternatives to cardiac catheterization, possible angioplasty, and stenting with the patient. Risks include but are not limited  to bleeding, infection, vascular injury, stroke, myocardial infection, arrhythmia, kidney injury, radiation-related injury in the case of prolonged fluoroscopy use, emergency cardiac surgery, and death. The patient understands the risks of serious complication is 1-2 in 4268 with diagnostic cardiac cath and 1-2% or less with angioplasty/stenting.  Plan today.  Signed, Troy Sine, MD, [facility] 02/09/2017, 10:29 AM

## 2017-02-09 NOTE — Progress Notes (Signed)
ANTICOAGULATION CONSULT NOTE - Follow Up Consult  Pharmacy Consult for Heparin  Indication: chest pain/ACS  Allergies  Allergen Reactions  . Imdur [Isosorbide Dinitrate] Anaphylaxis and Nausea And Vomiting    Nose swelling with difficulty breathing thru nose   Patient Measurements: Height: 5\' 9"  (175.3 cm) Weight: 185 lb 1.6 oz (84 kg) IBW/kg (Calculated) : 70.7  Vital Signs: Temp: 98.2 F (36.8 C) (08/30 0509) Temp Source: Oral (08/30 0509) BP: 124/62 (08/30 0509) Pulse Rate: 57 (08/30 0509)  Labs:  Recent Labs  02/08/17 0949 02/08/17 1111 02/08/17 1245 02/08/17 1850 02/09/17 0051 02/09/17 0510  HGB WILL FOLLOW  --  12.4*  --   --   --   HCT WILL FOLLOW  --  35.2*  --   --   --   PLT WILL FOLLOW  --  185  --   --   --   LABPROT 11.2  --   --  13.6  --   --   INR 1.1  --   --  1.05  --   --   HEPARINUNFRC  --   --   --   --   --  0.66  CREATININE 1.13 1.14  --   --   --   --   TROPONINI  --   --   --  <0.03 <0.03  --     Estimated Creatinine Clearance: 60.3 mL/min (by C-G formula based on SCr of 1.14 mg/dL).  Assessment: Heparin for CP, plans for cath, heparin level is therapeutic this AM  Goal of Therapy:  Heparin level 0.3-0.7 units/ml Monitor platelets by anticoagulation protocol: Yes   Plan:  -Cont heparin at 1300 units/hr -1200 HL  Khrystian Schauf 02/09/2017,6:58 AM

## 2017-02-09 NOTE — Interval H&P Note (Signed)
Cath Lab Visit (complete for each Cath Lab visit)  Clinical Evaluation Leading to the Procedure:   ACS: No.  Non-ACS:    Anginal Classification: CCS III  Anti-ischemic medical therapy: Minimal Therapy (1 class of medications)  Non-Invasive Test Results: Low-risk stress test findings: cardiac mortality <1%/year  Prior CABG: No previous CABG      History and Physical Interval Note:  02/09/2017 10:46 AM  Daniel Reyes  has presented today for surgery, with the diagnosis of sob, fatigue  The various methods of treatment have been discussed with the patient and family. After consideration of risks, benefits and other options for treatment, the patient has consented to  Procedure(s): RIGHT/LEFT HEART CATH AND CORONARY ANGIOGRAPHY (N/A) as a surgical intervention .  The patient's history has been reviewed, patient examined, no change in status, stable for surgery.  I have reviewed the patient's chart and labs.  Questions were answered to the patient's satisfaction.     Belva Crome III

## 2017-02-09 NOTE — Care Management Obs Status (Signed)
[place] NOTIFICATION   Patient Details  Name: RONDEL EPISCOPO MRN: [id number] Date of Birth: [birth year]   Medicare Observation Status Notification Given:  Yes    Dawayne Patricia, RN 02/09/2017, 3:30 PM

## 2017-02-10 ENCOUNTER — Other Ambulatory Visit: Payer: Self-pay | Admitting: Physician Assistant

## 2017-02-10 ENCOUNTER — Other Ambulatory Visit: Payer: Self-pay

## 2017-02-10 ENCOUNTER — Encounter (HOSPITAL_COMMUNITY): Payer: Self-pay | Admitting: Physician Assistant

## 2017-02-10 DIAGNOSIS — I4510 Unspecified right bundle-branch block: Secondary | ICD-10-CM | POA: Diagnosis not present

## 2017-02-10 DIAGNOSIS — I9589 Other hypotension: Secondary | ICD-10-CM | POA: Diagnosis not present

## 2017-02-10 DIAGNOSIS — Z8249 Family history of ischemic heart disease and other diseases of the circulatory system: Secondary | ICD-10-CM | POA: Diagnosis not present

## 2017-02-10 DIAGNOSIS — I25110 Atherosclerotic heart disease of native coronary artery with unstable angina pectoris: Secondary | ICD-10-CM | POA: Diagnosis not present

## 2017-02-10 DIAGNOSIS — Z7982 Long term (current) use of aspirin: Secondary | ICD-10-CM | POA: Diagnosis not present

## 2017-02-10 DIAGNOSIS — I200 Unstable angina: Secondary | ICD-10-CM | POA: Diagnosis not present

## 2017-02-10 DIAGNOSIS — D509 Iron deficiency anemia, unspecified: Secondary | ICD-10-CM | POA: Diagnosis not present

## 2017-02-10 DIAGNOSIS — R0609 Other forms of dyspnea: Principal | ICD-10-CM

## 2017-02-10 DIAGNOSIS — E785 Hyperlipidemia, unspecified: Secondary | ICD-10-CM | POA: Diagnosis not present

## 2017-02-10 DIAGNOSIS — R0600 Dyspnea, unspecified: Secondary | ICD-10-CM

## 2017-02-10 LAB — CBC
HCT: 33.4 % — ABNORMAL LOW (ref 39.0–52.0)
HEMOGLOBIN: 11.7 g/dL — AB (ref 13.0–17.0)
MCH: 27.9 pg (ref 26.0–34.0)
MCHC: 35 g/dL (ref 30.0–36.0)
MCV: 79.7 fL (ref 78.0–100.0)
PLATELETS: 185 10*3/uL (ref 150–400)
RBC: 4.19 MIL/uL — ABNORMAL LOW (ref 4.22–5.81)
RDW: 13.8 % (ref 11.5–15.5)
WBC: 4.2 10*3/uL (ref 4.0–10.5)

## 2017-02-10 LAB — IRON AND TIBC
Iron: 77 ug/dL (ref 45–182)
SATURATION RATIOS: 34 % (ref 17.9–39.5)
TIBC: 228 ug/dL — AB (ref 250–450)
UIBC: 151 ug/dL

## 2017-02-10 LAB — BASIC METABOLIC PANEL
Anion gap: 6 (ref 5–15)
BUN: 12 mg/dL (ref 6–20)
CALCIUM: 8.9 mg/dL (ref 8.9–10.3)
CO2: 21 mmol/L — ABNORMAL LOW (ref 22–32)
Chloride: 111 mmol/L (ref 101–111)
Creatinine, Ser: 1.04 mg/dL (ref 0.61–1.24)
GFR calc Af Amer: >60 mL/min (ref >60)
Glucose, Bld: 109 mg/dL — ABNORMAL HIGH (ref 65–99)
Potassium: 4.2 mmol/L (ref 3.5–5.1)
Sodium: 138 mmol/L (ref 135–145)

## 2017-02-10 MED ORDER — ASPIRIN 81 MG PO TBEC: 81.0000 mg | DELAYED_RELEASE_TABLET | Freq: Every day | ORAL | 6 refills | Status: DC

## 2017-02-10 MED ORDER — ATORVASTATIN CALCIUM 20 MG PO TABS: 80.0000 mg | ORAL_TABLET | Freq: Every day | ORAL | 6 refills | Status: DC

## 2017-02-10 MED ORDER — AMLODIPINE BESYLATE 2.5 MG PO TABS: 2.5000 mg | ORAL_TABLET | Freq: Every day | ORAL | 6 refills | Status: DC

## 2017-02-10 NOTE — Discharge Summary (Signed)
Discharge Summary    Patient ID: Daniel Reyes,  MRN: [id number], DOB/AGE: [birth year] 71 y.o.  Admit date: 02/08/2017 Discharge date: 02/10/2017  Primary Care Provider: Pricilla Holm A Primary Cardiologist: Dr. Irish Lack   Discharge Diagnoses    Principal Problem:   Unstable angina [facility]) Active Problems:   Hyperlipidemia   RBBB   DOE (dyspnea on exertion)   Allergies Allergies  Allergen Reactions  . Imdur [Isosorbide Dinitrate] Anaphylaxis and Nausea And Vomiting    Nose swelling with difficulty breathing thru nose    Diagnostic Studies/Procedures    RIGHT/LEFT HEART CATH AND CORONARY ANGIOGRAPHY  Conclusion     Prox RCA to Mid RCA lesion, 10 %stenosed.  Prox LAD lesion, 30 %stenosed.  Mid LAD lesion, 40 %stenosed.  The left ventricular ejection fraction is 55-65% by visual estimate.  The left ventricular systolic function is normal.  LV end diastolic pressure is normal.    Mild to moderate proximal and mid LAD plaque with less than 40% narrowing  Normal ramus intermedius.  Normal small circumflex.  Dominant right coronary with luminal irregularities.  Normal left ventricular size and function. EF 60% with normal in diastolic pressure.  Normal pulmonary pressures.  RECOMMENDATIONS:   No explanation for the patient's symptoms.  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       History of Present Illness     Daniel Reyes is a 71 y.o. male with a hx of hyperlipidemia presented to ER 02/08/17 for worsening DOE and activity intolerance.   Recently seen by Dr. Irish Lack in our office on 01/30/17 for evaluation of abnormal ECG and daytime fatigue at the request of his PCP. He has DOE with walking up steps and walking even a small [place]. He also admitted to some shoulder pain near the back of his neck. He had an echocardiogram on 01/31/17 that showed normal LV function with EF 55-60%, no  RWMA, grade 2 DD. He also had an exercise tolerance test on 01/31/17 that demonstrated poor exercise capacity and no evidence of ischemia by ECG analysis, but was a submaximal study. He was scheduled for outpatient cath on 02/14/17 and started on Imdur 30mg . He then had worsening symptoms with dyspnea at rest, nausea and dizziness. The Imdur was stopped and he presented to the ED for the worsening symptoms. Admits to having chest tightness with walking.    His ECG shows sinus rhythm at 64 bpm with right bundle branch block with repolarization changes.  Initial troponin is negative.  He has mild microcytic indices with an MCV of 77, and mild anemia.  Sodium is mildly decreased at 133.  Renal function is normal.  Hospital Course     Consultants: None  The patient was admitted and started on heparin for possible angina.   He has had some episodes of bradycardia. started low-dose amlodipine 2.5 mg for potential anti-ischemic benefit and high intensity statin therapy. No recurrent symptoms since  Admission on new medications. He underwent cardiac cath that showed mild to moderate proximal and LAD plaque with less than 40% stenosis. Continue amlodipine for possible microvascular angina. Reduce lipitor to 20mg  qd. Dr. Claiborne Billings recommended cardiopulmonary exercise stress test. No further chest pain/SOB.                                  The patient has been seen by Dr.  Claiborne Billings  today and deemed ready for discharge home. All follow-up appointments have been scheduled. Discharge medications are listed below.   Discharge Vitals Blood pressure 113/65, pulse 61, temperature 98.1 F (36.7 C), temperature source Oral, resp. rate 12, height 5\' 9"  (1.753 m), weight 187 lb 1.6 oz (84.9 kg), SpO2 99 %.  Filed Weights   02/08/17 1815 02/09/17 0509 02/10/17 0450  Weight: 187 lb 9.6 oz (85.1 kg) 185 lb 1.6 oz (84 kg) 187 lb 1.6 oz (84.9 kg)   Physical Exam  Constitutional: He is oriented to person, place, and time and  well-developed, well-nourished, and in no distress. No distress.  HENT:  Head: Normocephalic and atraumatic.  Eyes: Pupils are equal, round, and reactive to light. EOM are normal.  Neck: Normal range of motion.  Cardiovascular: Normal rate, regular rhythm and normal heart sounds.   R radial cath site without hematoma  Pulmonary/Chest: Effort normal and breath sounds normal. No respiratory distress. He has no wheezes.  Abdominal: Soft. Bowel sounds are normal. There is no tenderness.  Musculoskeletal: Normal range of motion. He exhibits no deformity.  Neurological: He is alert and oriented to person, place, and time.  Skin: Skin is warm and dry. He is not diaphoretic.  Psychiatric: Affect normal.     Labs & Radiologic Studies     CBC  Recent Labs  02/08/17 1245 02/10/17 0225  WBC 4.7 4.2  NEUTROABS 2.1  --   HGB 12.4* 11.7*  HCT 35.2* 33.4*  MCV 77.7* 79.7  PLT 185 253   Basic Metabolic Panel  Recent Labs  02/09/17 0510 02/10/17 0225  NA 134* 138  K 4.2 4.2  CL 105 111  CO2 21* 21*  GLUCOSE 99 109*  BUN 13 12  CREATININE 1.32* 1.04  CALCIUM 8.9 8.9   Cardiac Enzymes  Recent Labs  02/08/17 1850 02/09/17 0051 02/09/17 0510  TROPONINI <0.03 <0.03 <0.03   Hemoglobin A1C  Recent Labs  02/08/17 1850  HGBA1C 5.6   Fasting Lipid Panel  Recent Labs  02/09/17 0051  CHOL 223*  HDL 31*  LDLCALC 153*  TRIG 193*  CHOLHDL 7.2   Thyroid Function Tests  Recent Labs  02/08/17 1850  TSH 1.437    Dg Chest 2 View  Result Date: 02/08/2017 CLINICAL DATA:  Chest pain, shortness of breath. EXAM: CHEST  2 VIEW COMPARISON:  Radiographs of January 05, 2017. FINDINGS: The heart size and mediastinal contours are within normal limits. Both lungs are clear. No pneumothorax or pleural effusion is noted. The visualized skeletal structures are unremarkable. IMPRESSION: No active cardiopulmonary disease. Electronically Signed   By: Marijo Conception, M.D.   On: 02/08/2017  11:28    Disposition   Pt is being discharged home today in good condition.  Follow-up Plans & Appointments    Follow-up Information    Jettie Booze, MD Follow up.   Specialties:  Cardiology, Radiology, Interventional Cardiology Why:  office will call with time and date of follow up and cardiopulmonary stress test.  Contact information: [address] [address] [phone]          Discharge Instructions    Diet - low sodium heart healthy    Complete by:  As directed    Discharge instructions    Complete by:  As directed    No driving for 48 hours. No lifting over 5 lbs for 1 week. No sexual activity for 1 week. You may return to work on 02/15/17 .  Keep procedure site clean & dry. If you notice increased pain, swelling, bleeding or pus, call/return!  You may shower, but no soaking baths/hot tubs/pools for 1 week.   Increase activity slowly    Complete by:  As directed       Discharge Medications   Current Discharge Medication List    START taking these medications   Details  amLODipine (NORVASC) 2.5 MG tablet Take 1 tablet (2.5 mg total) by mouth daily. Qty: 30 tablet, Refills: 6    aspirin EC 81 MG EC tablet Take 1 tablet (81 mg total) by mouth daily. Qty: 30 tablet, Refills: 6    atorvastatin (LIPITOR) 20 MG tablet Take 4 tablets (80 mg total) by mouth daily at 6 PM. Qty: 30 tablet, Refills: 6      CONTINUE these medications which have NOT CHANGED   Details  acetaminophen (TYLENOL) 325 MG tablet Take 650 mg by mouth every 6 (six) hours as needed.    Aspirin-Salicylamide-Caffeine (BC HEADACHE PO) Take 1 packet by mouth daily as needed (headache).    sulfamethoxazole-trimethoprim (BACTRIM DS,SEPTRA DS) 800-160 MG tablet Take 1 tablet by mouth 2 (two) times daily.      STOP taking these medications     isosorbide mononitrate (IMDUR) 30 MG 24 hr tablet           Outstanding Labs/Studies   Consider OP f/u labs 6-8  weeks given statin initiation this admission. Cardiopulmonary exercise stress test  Duration of Discharge Encounter   Greater than 30 minutes including physician time.  Signed, Bhagat,Bhavinkumar PA-C 02/10/2017, 2:31 PM    Patient seen and examined. Agree with assessment and plan. I reviewed the patient's catheterization findings with he and his wife.  Catheterization revealed mild to moderate nonobstructive CAD.  With the patient's significant change in symptoms status with exertional dyspnea not attributed to significant CAD  I have recommended consideration for an outpatient cardiopulmonary stress test to assess maximum oxygen consumption and evaluate potential for microvascular angina.  The patient has also been seen by Dr. Elsworth Soho of pulmonary.  The patient will be discharged today.  He will follow-up with Dr. Irish Lack.   Troy Sine, MD, [facility] 02/10/2017 5:51 PM

## 2017-02-10 NOTE — Progress Notes (Signed)
Pt ambulating hall with wife after encouragement.  Initially reluctant due to "we haven't fixed the problem."  Pt states he experiences SOB and light headedness with exertion.  Encouraged to walk slowly and stay close to hand rails.  Will con't plan of care.

## 2017-02-14 ENCOUNTER — Telehealth: Payer: Self-pay

## 2017-02-14 ENCOUNTER — Telehealth: Payer: Self-pay | Admitting: Interventional Cardiology

## 2017-02-14 NOTE — Telephone Encounter (Signed)
Spoke with patient.  Informed he was to have CPX scheduled prior to his hospital f/u appt.   Pt has concerns about having another study that may cause his BP to drop like it did during the GXT when his HR went up and he got dizzy.    I spoke to Dr. Hassell Done nurse and then advised patient to come to scheduled ov tomorrow and at that point decide what the next step would need to be.  Also s/w patient's wife who reports patient has had repeated MRSA infections and takes antibiotics and then they recur.  She wonders if the MRSA infections and the current symptoms are related.

## 2017-02-14 NOTE — Telephone Encounter (Signed)
-----   Message from [place], [place] sent at 02/10/2017  2:29 PM EDT ----- Please schedule this patient for a follow-up appointment.  Primary Cardiologist: Dr. Irish Lack  Date of Discharge: 02/10/2017 Appointment Needed Within: 3 weeks  Appointment Type: Post hospital   This patient will need cardiopulmonary exercise stress test (order place --> please review for correct order)  prior to appointment.  Thank you! Robbie Lis, PA-C

## 2017-02-14 NOTE — Telephone Encounter (Signed)
Error

## 2017-02-15 ENCOUNTER — Telehealth: Payer: Self-pay | Admitting: Interventional Cardiology

## 2017-02-15 ENCOUNTER — Encounter: Payer: Self-pay | Admitting: Interventional Cardiology

## 2017-02-15 ENCOUNTER — Ambulatory Visit (INDEPENDENT_AMBULATORY_CARE_PROVIDER_SITE_OTHER): Payer: Medicare HMO | Admitting: Interventional Cardiology

## 2017-02-15 ENCOUNTER — Telehealth: Payer: Self-pay | Admitting: Pulmonary Disease

## 2017-02-15 VITALS — BP 112/60 | HR 61 | Ht 69.0 in | Wt 187.0 lb

## 2017-02-15 DIAGNOSIS — R5383 Other fatigue: Secondary | ICD-10-CM

## 2017-02-15 DIAGNOSIS — I2510 Atherosclerotic heart disease of native coronary artery without angina pectoris: Secondary | ICD-10-CM | POA: Insufficient documentation

## 2017-02-15 DIAGNOSIS — R0609 Other forms of dyspnea: Secondary | ICD-10-CM

## 2017-02-15 DIAGNOSIS — R0600 Dyspnea, unspecified: Secondary | ICD-10-CM

## 2017-02-15 DIAGNOSIS — R0602 Shortness of breath: Secondary | ICD-10-CM

## 2017-02-15 DIAGNOSIS — L0390 Cellulitis, unspecified: Secondary | ICD-10-CM

## 2017-02-15 MED ORDER — ATORVASTATIN CALCIUM 40 MG PO TABS: 40.0000 mg | ORAL_TABLET | Freq: Every day | ORAL | 3 refills | Status: DC

## 2017-02-15 NOTE — Patient Instructions (Addendum)
Medication Instructions:  Your physician has recommended you make the following change in your medication:   1. You should only be taking amlodipine 2.5 mg (1 tablet) daily.  2. DECREASE atorvastatin to 40 mg daily.   Labwork: None ordered  Testing/Procedures: Your physician has recommended that you have a pulmonary function test. Pulmonary Function Tests are a group of tests that measure how well air moves in and out of your lungs.    Any Other Special Instructions Will Be Listed Below (If Applicable).     If you need a refill on your cardiac medications before your next appointment, please call your pharmacy.

## 2017-02-15 NOTE — Telephone Encounter (Signed)
I do not see where Carleigh had called this office regarding this pt.   Dr. Irish Lack is in [place].  Carleigh please advise.   Thanks!

## 2017-02-15 NOTE — Telephone Encounter (Signed)
New message      Calling to make sure it is ok to do PFT test on pt tomorrow.  Order said 1 week but pt is scheduled for a PFT test tomorrow.  Please call

## 2017-02-15 NOTE — Progress Notes (Signed)
Cardiology Office Note   Date:  02/15/2017   ID:  Daniel Reyes, DOB Jan 15, 1946, MRN [id number]  PCP:  Hoyt Koch, MD    No chief complaint on file. DOE   [facility] Readings from Last 3 Encounters:  02/15/17 187 lb (84.8 kg)  02/10/17 187 lb 1.6 oz (84.9 kg)  01/30/17 186 lb 9.6 oz (84.6 kg)       History of Present Illness: Daniel Reyes is a 71 y.o. male  Who had been having significant DOE and decrease exercise tolerance.  THis resulted in an extensive cardiac w/u showing poor exercise tolerance on the treadmill.  Echo showed no Significant structural heart disease. Cardiac cath in 8/18 revealed nonobstructive disease.:  Mild to moderate proximal and mid LAD plaque with less than 40% narrowing  Normal ramus intermedius.  Normal small circumflex.  Dominant right coronary with luminal irregularities.  Normal left ventricular size and function. EF 60% with normal in diastolic pressure.  Normal pulmonary pressures.  No clear etiology was determined for his shortness of breath and fatigue. A cardiopulmonary exercise test was recommended but the patient declined.  Over the past few days, he thinks he is a little better with exertion.  Wife reports that he is not sleeping as much during the day.  He thinks he is taking 4 amlodipine pills daily, and one lipitor pill.  Discharge instructions stated the opposite.    Past Medical History:  Diagnosis Date  . Hyperlipemia   . Mild CAD    a. cath 02/09/17 - Mild to moderate proximal and mid LAD plaque with less than 40% narrowing  . Nocturnal hypoxia     Past Surgical History:  Procedure Laterality Date  . CHOLECYSTECTOMY OPEN  1998  . gerd surgery    . NECK SURGERY  1983  . RIGHT/LEFT HEART CATH AND CORONARY ANGIOGRAPHY N/A 02/09/2017   Procedure: RIGHT/LEFT HEART CATH AND CORONARY ANGIOGRAPHY;  Surgeon: Belva Crome, MD;  Location: [facility];  Service: Cardiovascular;  Laterality: N/A;     Current  Outpatient Prescriptions  Medication Sig Dispense Refill  . acetaminophen (TYLENOL) 325 MG tablet Take 650 mg by mouth every 6 (six) hours as needed.    Marland Kitchen amLODipine (NORVASC) 2.5 MG tablet Take 1 tablet (2.5 mg total) by mouth daily. 30 tablet 6  . aspirin EC 81 MG EC tablet Take 1 tablet (81 mg total) by mouth daily. 30 tablet 6  . atorvastatin (LIPITOR) 20 MG tablet Take 4 tablets (80 mg total) by mouth daily at 6 PM. 30 tablet 6  . sulfamethoxazole-trimethoprim (BACTRIM DS,SEPTRA DS) 800-160 MG tablet Take 1 tablet by mouth 2 (two) times daily.     No current facility-administered medications for this visit.     Allergies:   Imdur [isosorbide dinitrate]    Social History:  The patient  reports that he has never smoked. He has never used smokeless tobacco. He reports that he drinks alcohol. He reports that he does not use drugs.   Family History:  The patient's family history includes Alzheimer's disease in his mother; Asthma in his brother; Atrial fibrillation in his brother; Colon cancer in his father; Heart attack in his father; Stroke in his brother.    ROS:  Please see the history of present illness.   Otherwise, review of systems are positive for fatigue, slightly better.   All other systems are reviewed and negative.    PHYSICAL EXAM: VS:  BP 112/60  Pulse 61   Ht 5\' 9"  (1.753 m)   Wt 187 lb (84.8 kg)   SpO2 98%   BMI 27.62 kg/m  , BMI Body mass index is 27.62 kg/m. GEN: Well nourished, well developed, in no acute distress  HEENT: normal  Neck: no JVD, carotid bruits, or masses Cardiac: RRR; no murmurs, rubs, or gallops,no edema  Respiratory:  clear to auscultation bilaterally, normal work of breathing GI: soft, nontender, nondistended, + BS MS: no deformity or atrophy  Skin: warm and dry, no rash Neuro:  Strength and sensation are intact Psych: euthymic mood, full affect     Recent Labs: 01/05/2017: ALT 25 02/08/2017: TSH 1.437 02/10/2017: BUN 12; Creatinine,  Ser 1.04; Hemoglobin 11.7; Platelets 185; Potassium 4.2; Sodium 138   Lipid Panel    Component Value Date/Time   CHOL 223 (H) 02/09/2017 0051   TRIG 193 (H) 02/09/2017 0051   HDL 31 (L) 02/09/2017 0051   CHOLHDL 7.2 02/09/2017 0051   VLDL 39 02/09/2017 0051   LDLCALC 153 (H) 02/09/2017 0051     Other studies Reviewed: Additional studies/ records that were reviewed today with results demonstrating: Cath results reviewed.  Normal PA pressure.   ASSESSMENT AND PLAN:  1. CAD: Mild to moderate.  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  He has been taking 4 tabs (10 mg).  Will confirm dose.  Will decrease atorvastatin to 40 mg daily, although he may have only been taking 20 mg daily. 2. H/o MRSA cellulitis: He has had small pustules on his skin.  Unclear if there is some source of MRSA causing these eruptions.  No valvular vegetations noted on recent echo.  3. Hyperlipidemia:  LDL above target.  Atorvastatin should help.  Recheck lipids in 3 months. 4. SHOB/DOE/fatigue: Improved slightly.  Check PFTs.  COnsider CPX stress test.  He was reluctant to do this now.  He will see how he does with gradually increasing exercise.   Current medicines are reviewed at length with the patient today.  The patient concerns regarding his medicines were addressed.  The following changes have been made:  No change  Labs/ tests ordered today include:  No orders of the defined types were placed in this encounter.   Recommend 150 minutes/week of aerobic exercise Low fat, low carb, high fiber diet recommended  Disposition:   FU based on test result   Signed, Larae Grooms, MD  02/15/2017 10:30 AM    [facility], [place], [place]  [address] Phone: [phone]; Fax: [phone]

## 2017-02-15 NOTE — Telephone Encounter (Signed)
Left message to call back  

## 2017-02-16 ENCOUNTER — Ambulatory Visit (INDEPENDENT_AMBULATORY_CARE_PROVIDER_SITE_OTHER): Payer: Medicare HMO | Admitting: Internal Medicine

## 2017-02-16 ENCOUNTER — Telehealth: Payer: Self-pay | Admitting: Interventional Cardiology

## 2017-02-16 DIAGNOSIS — R0602 Shortness of breath: Secondary | ICD-10-CM

## 2017-02-16 LAB — PULMONARY FUNCTION TEST
DL/VA % pred: 62 %
DL/VA: 2.86 ml/min/mmHg/L
DLCO COR: 15.88 ml/min/mmHg
DLCO UNC: 16.14 ml/min/mmHg
DLCO cor % pred: 51 %
DLCO unc % pred: 52 %
FEF 25-75 Post: 3.08 L/sec
FEF 25-75 Pre: 2.08 L/sec
FEF2575-%Change-Post: 47 %
FEF2575-%Pred-Post: 129 %
FEF2575-%Pred-Pre: 87 %
FEV1-%Change-Post: 16 %
FEV1-%Pred-Post: 98 %
FEV1-%Pred-Pre: 84 %
FEV1-Post: 3.06 L
FEV1-Pre: 2.64 L
FEV1FVC-%Change-Post: 13 %
FEV1FVC-%Pred-Pre: 93 %
FEV6-%Change-Post: 3 %
FEV6-%PRED-POST: 96 %
FEV6-%PRED-PRE: 93 %
FEV6-POST: 3.88 L
FEV6-Pre: 3.76 L
FEV6FVC-%CHANGE-POST: 0 %
FEV6FVC-%PRED-POST: 105 %
FEV6FVC-%Pred-Pre: 105 %
FVC-%CHANGE-POST: 2 %
FVC-%PRED-POST: 91 %
FVC-%PRED-PRE: 89 %
FVC-POST: 3.9 L
FVC-PRE: 3.8 L
POST FEV6/FVC RATIO: 99 %
PRE FEV1/FVC RATIO: 69 %
Post FEV1/FVC ratio: 79 %
Pre FEV6/FVC Ratio: 99 %
RV % pred: 94 %
RV: 2.26 L
TLC % PRED: 85 %
TLC: 5.8 L

## 2017-02-16 NOTE — Telephone Encounter (Signed)
New message      Pt c/o medication issue:  1. Name of Medication:  Amlodipine 2.5mg  and atorvastatin 40mg  daily 2. How are you currently taking this medication (dosage and times per day)?   3. Are you having a reaction (difficulty breathing--STAT)?  no  4. What is your medication issue?  Calling to let the nurse know that pt was taking his medications correctly.  Pt was wrong.  The presc is correct.

## 2017-02-16 NOTE — Telephone Encounter (Signed)
Spoke with patient's wife (DPR on file). She states that she was calling to confirm that he was just taking the amlodipine 2.5 mg QD. She also confirmed that he did decrease his atorvastatin to 40 mg QD as directed yesterday at [place].

## 2017-02-16 NOTE — Telephone Encounter (Signed)
Left a note in patient's appointment slot stating that it was okay for patient to have PFT done today and that he he did not need to wait 1 week.

## 2017-02-16 NOTE — Progress Notes (Signed)
PFT done today. 

## 2017-02-20 NOTE — Telephone Encounter (Signed)
Attempted to call Dr. Hassell Done office, but was not able to get through to anyone.  Will try back later.

## 2017-02-20 NOTE — Telephone Encounter (Signed)
I have spoken with Daniel Reyes with Dr. Hassell Done office. Daniel Reyes states she contacted our office to make our office aware that pt was cleared to have PFT.  It appears that pt had PFT 02/16/17. Will close encounter, as nothing further is needed.

## 2017-02-21 ENCOUNTER — Telehealth: Payer: Self-pay | Admitting: Interventional Cardiology

## 2017-02-21 DIAGNOSIS — R0602 Shortness of breath: Secondary | ICD-10-CM

## 2017-02-21 NOTE — Telephone Encounter (Signed)
Follow Up:    Returning Brittany's call from yesterday,concerning his test results.

## 2017-02-22 NOTE — Telephone Encounter (Signed)
Jettie Booze, MD  Drue Novel I, RN        There is evidence of decreased lung function which could be causing his [facility]. WOuld refer to pulmonary.

## 2017-02-22 NOTE — Telephone Encounter (Signed)
Left message for patient to call back  

## 2017-02-22 NOTE — Telephone Encounter (Signed)
-----   Message from Jettie Booze, MD sent at 02/20/2017  4:26 PM EDT ----- OK to refer to Dr. Elsworth Soho, Dr. Chase Caller, Dr. Titus Mould, Dr. Lake Bells , or Dr. Lamonte Sakai.

## 2017-02-22 NOTE — Telephone Encounter (Signed)
Patient made aware of results. Patient verbalizes understanding. Patient states that he has seen Dr. Elsworth Soho in the past. Referral placed for patient to see Dr. Elsworth Soho. Results forwarded.

## 2017-02-27 ENCOUNTER — Telehealth: Payer: Self-pay | Admitting: Internal Medicine

## 2017-02-27 NOTE — Telephone Encounter (Signed)
Left message for patient to call back  

## 2017-02-28 ENCOUNTER — Ambulatory Visit: Payer: Medicare HMO | Admitting: Interventional Cardiology

## 2017-02-28 ENCOUNTER — Other Ambulatory Visit: Payer: Self-pay

## 2017-02-28 DIAGNOSIS — G4733 Obstructive sleep apnea (adult) (pediatric): Secondary | ICD-10-CM

## 2017-02-28 NOTE — Telephone Encounter (Signed)
Spoke with patient. He requested to see RA. Not sure why this was routed to [facility]. Pt has been scheduled with RA for this Thursday 9/20 at 130. He is aware of appt and location. Nothing else needed at time of call.

## 2017-02-28 NOTE — Telephone Encounter (Signed)
Katie please advise if we can work this pt in. Thanks!

## 2017-03-02 ENCOUNTER — Encounter: Payer: Self-pay | Admitting: Pulmonary Disease

## 2017-03-02 ENCOUNTER — Ambulatory Visit (INDEPENDENT_AMBULATORY_CARE_PROVIDER_SITE_OTHER): Payer: Medicare HMO | Admitting: Pulmonary Disease

## 2017-03-02 VITALS — BP 114/60 | HR 70 | Ht 69.0 in | Wt 186.0 lb

## 2017-03-02 DIAGNOSIS — R0609 Other forms of dyspnea: Secondary | ICD-10-CM

## 2017-03-02 DIAGNOSIS — R0602 Shortness of breath: Secondary | ICD-10-CM | POA: Diagnosis not present

## 2017-03-02 DIAGNOSIS — R0600 Dyspnea, unspecified: Secondary | ICD-10-CM

## 2017-03-02 DIAGNOSIS — G4710 Hypersomnia, unspecified: Secondary | ICD-10-CM | POA: Diagnosis not present

## 2017-03-02 DIAGNOSIS — R5382 Chronic fatigue, unspecified: Secondary | ICD-10-CM

## 2017-03-02 MED ORDER — ALBUTEROL SULFATE HFA 108 (90 BASE) MCG/ACT IN AERS: 2.0000 | INHALATION_SPRAY | Freq: Four times a day (QID) | RESPIRATORY_TRACT | 2 refills | Status: DC | PRN

## 2017-03-02 NOTE — Assessment & Plan Note (Addendum)
Plan for home sleep study. Would've preferred attended polysomnogram but this has been denied by insurance.

## 2017-03-02 NOTE — Assessment & Plan Note (Signed)
Also check a.m. cortisol. TSH was noted to be normal prior

## 2017-03-02 NOTE — Assessment & Plan Note (Signed)
No clear etiology demonstrated -differential includes pulmonary emboli and pulmonary fibrosis. Pulmonary hypertension has been ruled out by cardiac cath.  With decreased DLCO and desaturation on walking stairs and during sleep, pulmonary fibrosis needs to be ruled out as an etiology. with chest x-ray being normal this is unlikely. We'll proceed with CT angiogram and this will also enable [place] to look at lung parenchyma.

## 2017-03-02 NOTE — Patient Instructions (Signed)
Check orthostatic (Ambulatory saturation  Trial of albuterol MDI 2 puffs every 6 hours as needed for shortness of breath  Schedule home sleep study  Check 8 AM serum cortisol  CT angiogram to look for blood clots

## 2017-03-02 NOTE — Progress Notes (Signed)
   Subjective:    Patient ID: Daniel Reyes, male    DOB: Jul 30, 1945, 71 y.o.   MRN: [id number]  HPI  71 year old Never smoker For follow-up of dyspnea on exertion x2-3 yrs He reports excessive daytime fatigue for at least 2 years.   He had an extensive evaluation in 2016 including a negative Holter study. TSH has been normal as also an age-appropriate cancer screening.  He underwent cardiac evaluation, cardiac cath showed nonobstructive CAD, pulmonary pressures were normal as well as LVEDP.  Echo 01/2017 did not show any evidence of pulmonary hypertension. Exercise stress test showed a hypotensive response  Nocturnal oximetry 01/2017  >> showed 10.9 minutes of oxygen desaturation less than 88% with oxygen desaturation index of 25 over an 8.5 hours of monitoring, his average pulse was 54/m With lowest pulse documented as 47.  PFTs showed ratio 69 with FEV1 of 84% and FVC of 89% with significant broncho-dilator response, TLC was preserved at 55% and DLCO was decreased at 52% corrected for alveolar volume to 62%.  Orthostatics were measured today and were normal. He desaturated to 79% on walking up and down the stairs with heart rate increased to 11 9/m and recovered within 2 minutes. He did not desaturate when walking on level ground    Past Medical History:  Diagnosis Date  . Hyperlipemia   . Mild CAD    a. cath 02/09/17 - Mild to moderate proximal and mid LAD plaque with less than 40% narrowing  . Nocturnal hypoxia     Review of Systems neg for any significant sore throat, dysphagia, itching, sneezing, nasal congestion or excess/ purulent secretions, fever, chills, sweats, unintended wt loss, pleuritic or exertional cp, hempoptysis, orthopnea pnd or change in chronic leg swelling. Also denies presyncope, palpitations, heartburn, abdominal pain, nausea, vomiting, diarrhea or change in bowel or urinary habits, dysuria,hematuria, rash, arthralgias, visual complaints, headache, numbness  weakness or ataxia.     Objective:   Physical Exam  Gen. Pleasant, well-nourished, in no distress ENT - no thrush, no post nasal drip Neck: No JVD, no thyromegaly, no carotid bruits Lungs: no use of accessory muscles, no dullness to percussion, clear without rales or rhonchi  Cardiovascular: Rhythm regular, heart sounds  normal, no murmurs or gallops, no peripheral edema Musculoskeletal: No deformities, no cyanosis or clubbing        Assessment & Plan:

## 2017-03-03 ENCOUNTER — Ambulatory Visit (HOSPITAL_BASED_OUTPATIENT_CLINIC_OR_DEPARTMENT_OTHER)
Admission: RE | Admit: 2017-03-03 | Discharge: 2017-03-03 | Disposition: A | Discharge status: Home / Self Care | Payer: Medicare HMO | Source: Ambulatory Visit | Attending: Pulmonary Disease | Admitting: Pulmonary Disease

## 2017-03-03 ENCOUNTER — Other Ambulatory Visit: Payer: Medicare HMO

## 2017-03-03 ENCOUNTER — Encounter (HOSPITAL_BASED_OUTPATIENT_CLINIC_OR_DEPARTMENT_OTHER): Payer: Self-pay

## 2017-03-03 DIAGNOSIS — R0609 Other forms of dyspnea: Secondary | ICD-10-CM | POA: Diagnosis not present

## 2017-03-03 DIAGNOSIS — R0602 Shortness of breath: Secondary | ICD-10-CM | POA: Diagnosis not present

## 2017-03-03 IMAGING — CT CT ANGIO CHEST
2 of 8 series · 19 of 36 positions shown · IV contrast (isovue)
Comparison: None.

CLINICAL DATA: Shortness of breath, fatigue

EXAM:
CT ANGIOGRAPHY CHEST WITH CONTRAST
TECHNIQUE: Multidetector CT imaging of the chest was performed using the
standard protocol during bolus administration of intravenous
contrast. Multiplanar CT image reconstructions and MIPs were
obtained to evaluate the vascular anatomy.
CONTRAST:  100 cc Isovue 370 IV

[Series 6: pe coronal mpr · coronal · 0.58mm/px · 1 of 129 slices shown]
[im 65/129  mediastinal]
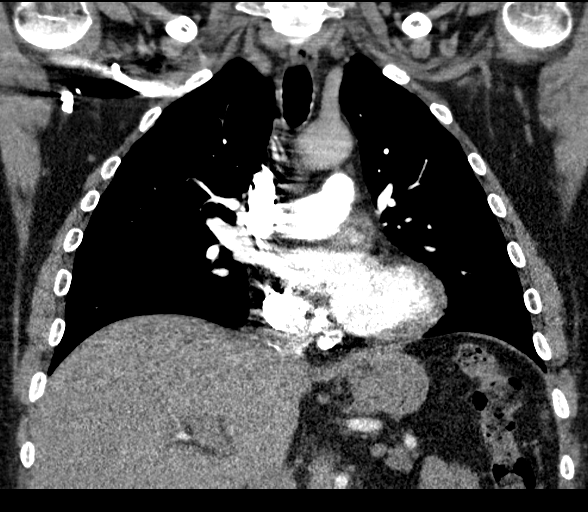

[Series 10: pe thins · axial · 0.71mm/px · z∈[-286,-37]mm · 18 of 279 slices shown]
[im 15/279  lung]
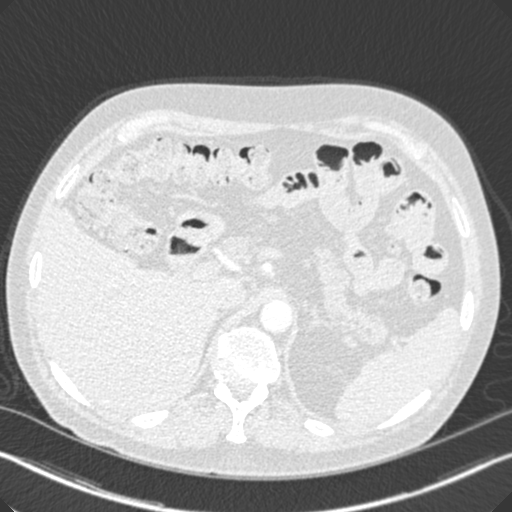
[im 30/279  mediastinal]
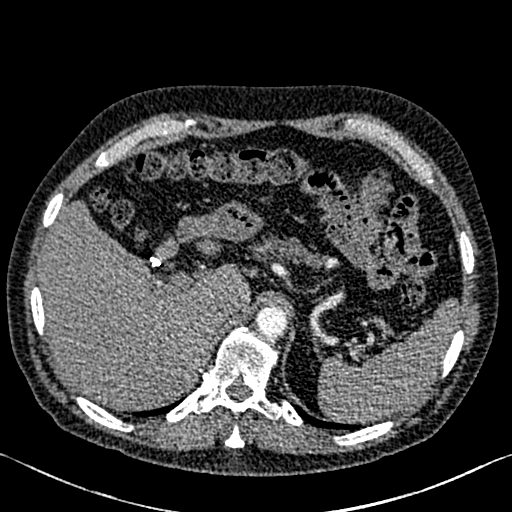
[im 44/279  lung]
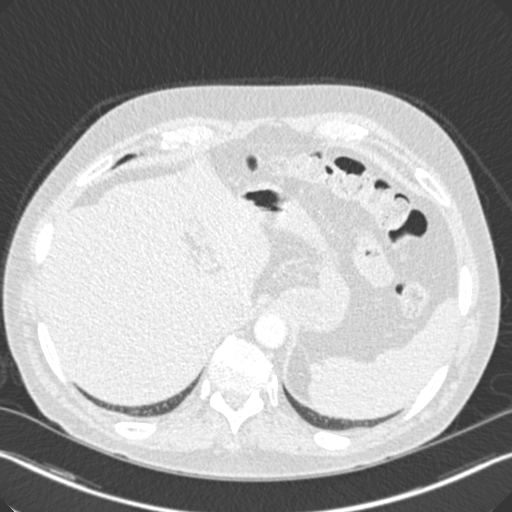
[im 59/279  mediastinal]
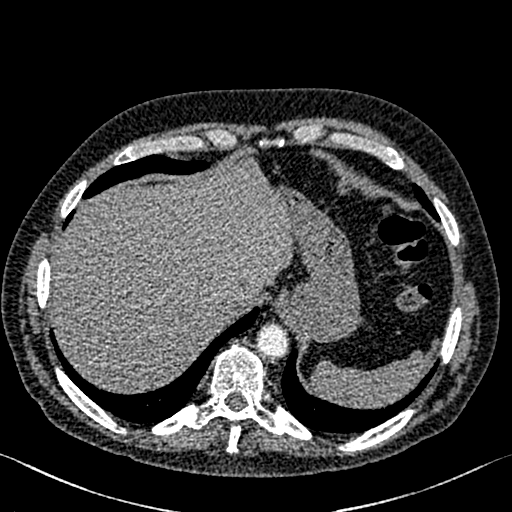
[im 74/279  lung]
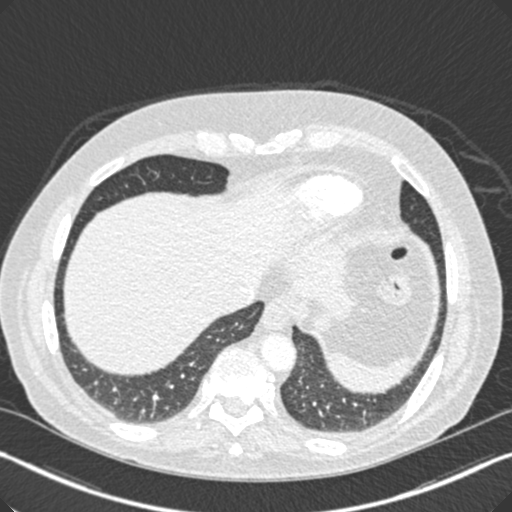
[im 88/279  mediastinal]
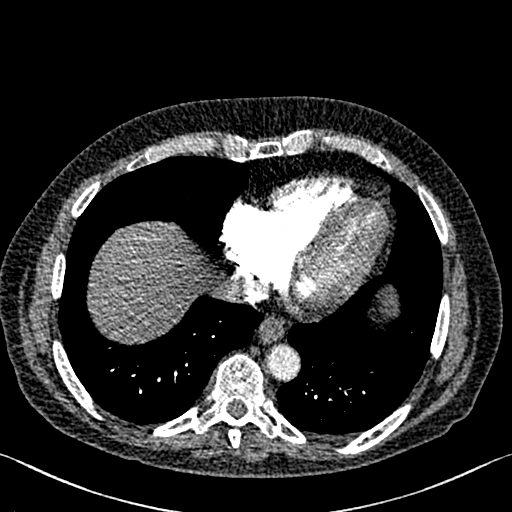
[im 103/279  lung]
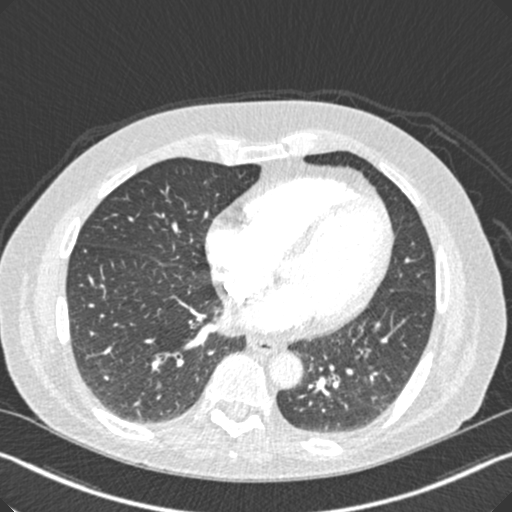
[im 118/279  mediastinal]
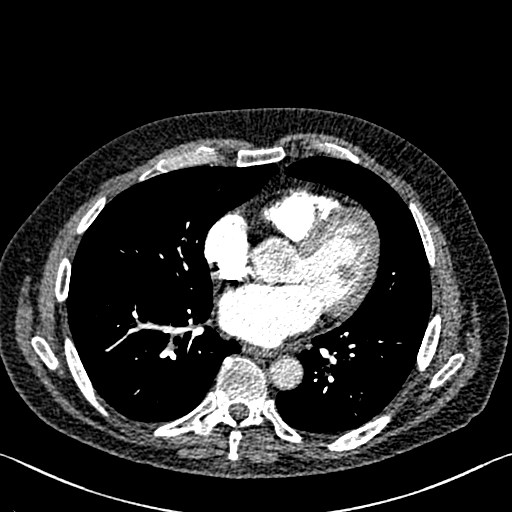
[im 132/279  lung]
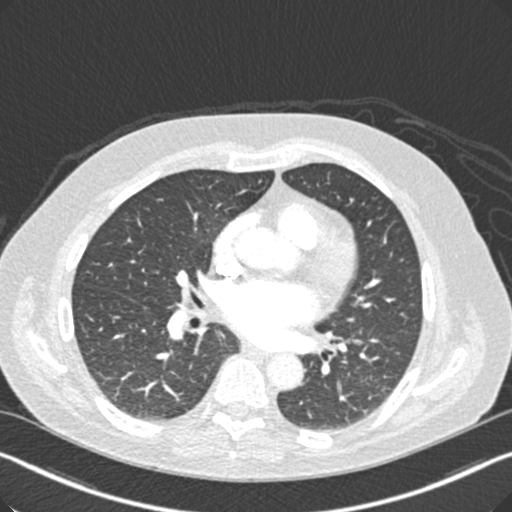
[im 147/279  mediastinal]
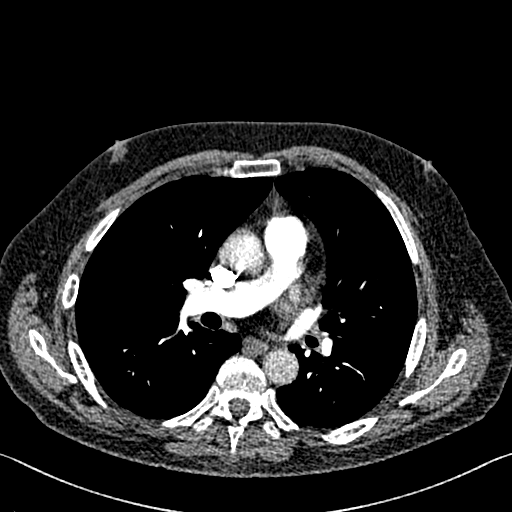
[im 161/279  lung]
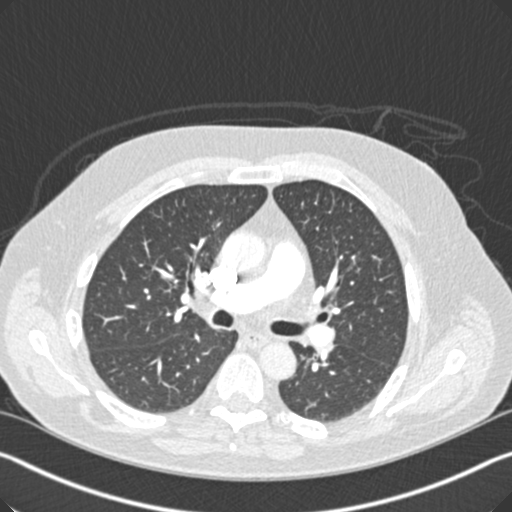
[im 176/279  mediastinal]
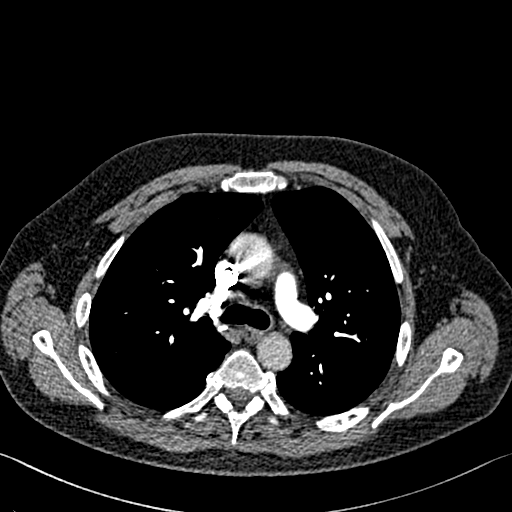
[im 191/279  lung]
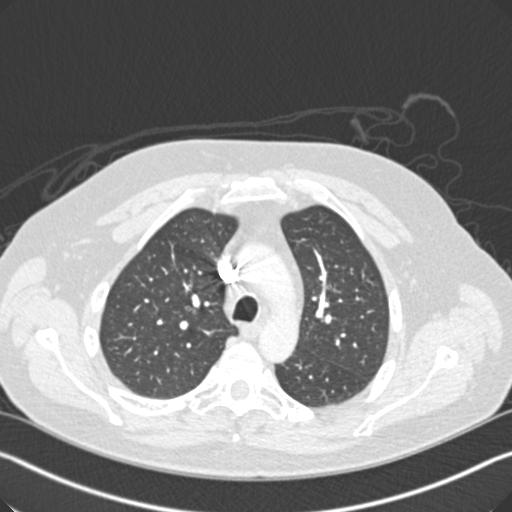
[im 205/279  mediastinal]
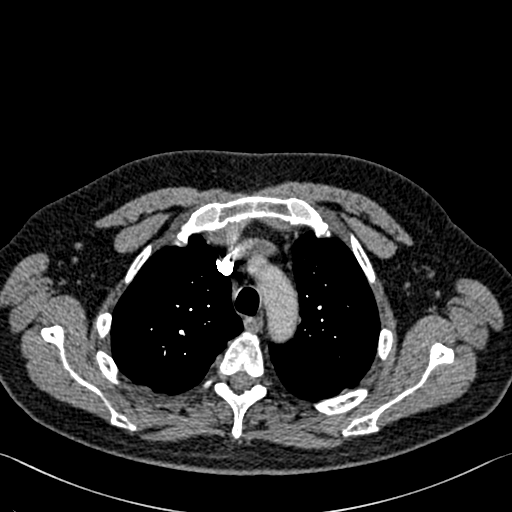
[im 220/279  lung]
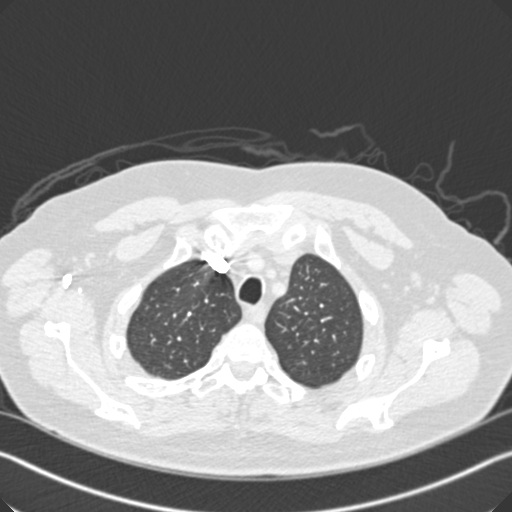
[im 235/279  mediastinal]
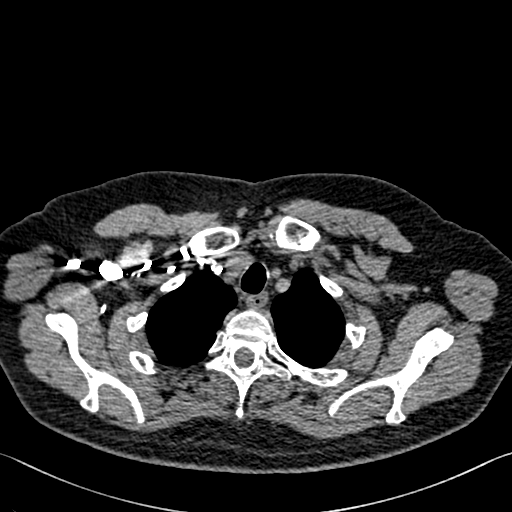
[im 249/279  lung]
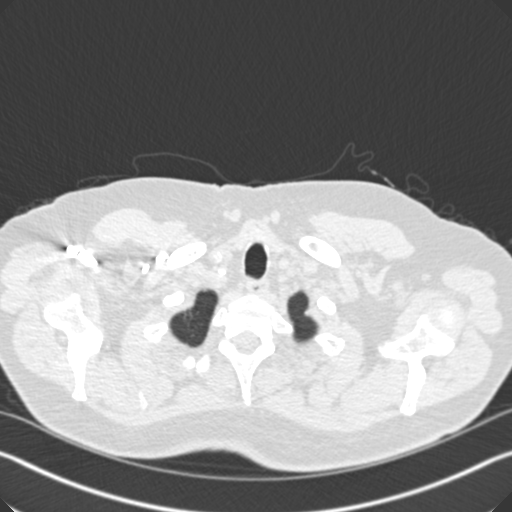
[im 264/279  mediastinal]
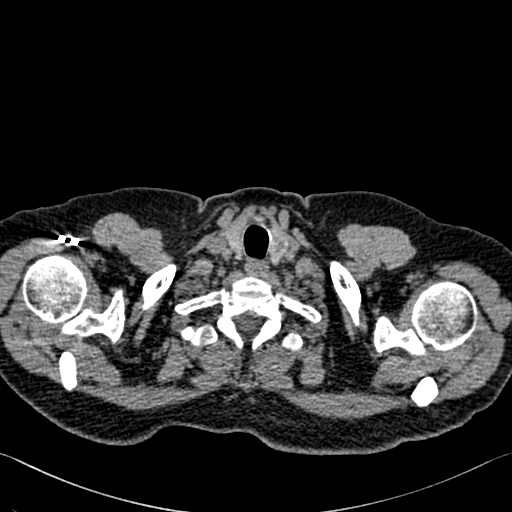

[19 of 36 positions shown; findings below may reference images not displayed]

FINDINGS: Cardiovascular: No filling defects in the pulmonary artery is to
suggest pulmonary emboli. Heart is normal size. Aorta is normal
caliber.

Mediastinum/Nodes: No mediastinal, hilar, or axillary adenopathy.

Lungs/Pleura: Lungs are clear. No focal airspace opacities or
suspicious nodules. No effusions.

Upper Abdomen: Prior cholecystectomy.  No acute findings.

Musculoskeletal: Chest wall soft tissues are unremarkable. No acute
bony abnormality.

Review of the MIP images confirms the above findings.
IMPRESSION: No evidence of pulmonary embolus.

No acute cardiopulmonary disease.

## 2017-03-03 MED ORDER — IOPAMIDOL (ISOVUE-370) INJECTION 76%
100.0000 mL | Freq: Once | INTRAVENOUS | Status: AC | PRN
  Administered 2017-03-03: 100 mL via INTRAVENOUS

## 2017-03-03 NOTE — Progress Notes (Signed)
Spoke with pt and notified of results per Dr. Alva. Pt verbalized understanding and denied any questions. 

## 2017-03-06 DIAGNOSIS — G4733 Obstructive sleep apnea (adult) (pediatric): Secondary | ICD-10-CM | POA: Diagnosis not present

## 2017-03-07 ENCOUNTER — Telehealth: Payer: Self-pay | Admitting: Family Medicine

## 2017-03-07 NOTE — Telephone Encounter (Signed)
I would be happen to have him join [place]

## 2017-03-07 NOTE — Telephone Encounter (Signed)
Pt states his pulmonologist Dr Elsworth Soho recommends him to see Dr Willette Alma as his PCP he says she would be excellent to oversee all of his specific conditions. Pt would like to ask Dr Charlett Blake if she will take him on as a new pt. Daniel Reyes.

## 2017-03-08 ENCOUNTER — Telehealth: Payer: Self-pay | Admitting: Pulmonary Disease

## 2017-03-08 DIAGNOSIS — G4733 Obstructive sleep apnea (adult) (pediatric): Secondary | ICD-10-CM

## 2017-03-08 NOTE — Telephone Encounter (Signed)
Spoke with pt, he states he would like a copy of the sleep report and RA's recommendation for use of CPAP. He agreed to CPAP but would like for our office to call him tomorrow because he has a friend who owns a DME company out of state and wants to see if he is still in business so he can possibly order from here. I will leave message in triage so we can attempt to call patient tomorrow.

## 2017-03-08 NOTE — Telephone Encounter (Signed)
Home sleep study showed mild OSA with AHI of 12/hour His desaturation was mild-only 18 minutes with saturation less than 90% Given his other symptoms which have not resolved, would suggest treatment with CPAP Please schedule CPAP titration study and based on this we can start him with CPAP therapy

## 2017-03-08 NOTE — Telephone Encounter (Signed)
Pt given appt for October 2018. Called pt lmg with appt date and time.

## 2017-03-08 NOTE — Telephone Encounter (Signed)
RA please advise of the results of the home sleep test.  Thanks

## 2017-03-09 ENCOUNTER — Other Ambulatory Visit: Payer: Self-pay | Admitting: *Deleted

## 2017-03-09 DIAGNOSIS — G4733 Obstructive sleep apnea (adult) (pediatric): Secondary | ICD-10-CM

## 2017-03-09 LAB — CORTISOL, FREE: CORTISOL, FREE DIALYSIS, LCMS: 0.132 ug/dL

## 2017-03-09 NOTE — Telephone Encounter (Signed)
ATC--unable to leave vm due to mailbox not being setup.  Will call back.  

## 2017-03-10 ENCOUNTER — Telehealth: Payer: Self-pay | Admitting: Pulmonary Disease

## 2017-03-10 DIAGNOSIS — G4733 Obstructive sleep apnea (adult) (pediatric): Secondary | ICD-10-CM

## 2017-03-10 NOTE — Telephone Encounter (Signed)
Called and spoke with pt and he stated that he will try the cpap for 3 months to see if that helps.  He would like the order sent in so he can get started on this. He would like to try the res-med auto, if you feel this would work for him.  RA please advise. Thanks

## 2017-03-10 NOTE — Telephone Encounter (Signed)
AutoCPAP 5-15 cm, nasal pillows, humidity DL in 1 month OV in 6 wks with TP/ me

## 2017-03-13 ENCOUNTER — Encounter (HOSPITAL_BASED_OUTPATIENT_CLINIC_OR_DEPARTMENT_OTHER): Payer: Medicare HMO

## 2017-03-13 NOTE — Telephone Encounter (Signed)
Called and spoke to pt. Informed him of the recs per RA. Order placed and appt made with RA on 04/28/17. Pt verbalized understanding and denied any further questions or concerns at this time.

## 2017-03-13 NOTE — Telephone Encounter (Signed)
Please see phone note from 03/10/17.

## 2017-03-15 DIAGNOSIS — G4733 Obstructive sleep apnea (adult) (pediatric): Secondary | ICD-10-CM | POA: Diagnosis not present

## 2017-04-06 ENCOUNTER — Ambulatory Visit: Payer: Medicare HMO | Admitting: Family Medicine

## 2017-04-15 DIAGNOSIS — G4733 Obstructive sleep apnea (adult) (pediatric): Secondary | ICD-10-CM | POA: Diagnosis not present

## 2017-04-17 DIAGNOSIS — G4733 Obstructive sleep apnea (adult) (pediatric): Secondary | ICD-10-CM | POA: Diagnosis not present

## 2017-04-24 ENCOUNTER — Telehealth (HOSPITAL_COMMUNITY): Payer: Self-pay

## 2017-04-24 ENCOUNTER — Encounter: Payer: Self-pay | Admitting: Pulmonary Disease

## 2017-04-24 ENCOUNTER — Ambulatory Visit: Payer: Medicare HMO | Admitting: Pulmonary Disease

## 2017-04-24 VITALS — BP 118/70 | HR 67 | Ht 69.0 in | Wt 185.6 lb

## 2017-04-24 DIAGNOSIS — R0600 Dyspnea, unspecified: Secondary | ICD-10-CM

## 2017-04-24 DIAGNOSIS — R0609 Other forms of dyspnea: Secondary | ICD-10-CM

## 2017-04-24 DIAGNOSIS — Z9989 Dependence on other enabling machines and devices: Secondary | ICD-10-CM

## 2017-04-24 DIAGNOSIS — Z23 Encounter for immunization: Secondary | ICD-10-CM

## 2017-04-24 DIAGNOSIS — G4733 Obstructive sleep apnea (adult) (pediatric): Secondary | ICD-10-CM | POA: Diagnosis not present

## 2017-04-24 MED ORDER — FLUTICASONE FUROATE-VILANTEROL 100-25 MCG/INH IN AEPB: 1.0000 | INHALATION_SPRAY | Freq: Every day | RESPIRATORY_TRACT | 0 refills | Status: DC

## 2017-04-24 NOTE — Patient Instructions (Signed)
Trial of BREO once daily -call for prescription of this works. Use albuterol 2 puffs prior to exercise.  Referral to pulmonary rehab.  Change CPAP settings to 5-10 cm Continue using this I think this is helping you.  Call me if worse prior to next visit Discuss Lipitor with your PCP

## 2017-04-24 NOTE — Telephone Encounter (Signed)
Called and spoke with patient in regards to Pulmonary Rehab - Patient is interested in program but cannot begin program until January. Will f/u at the beginning of the year.

## 2017-04-24 NOTE — Addendum Note (Signed)
Addended by: Valerie Salts on: 04/24/2017 02:02 PM   Modules accepted: Orders

## 2017-04-24 NOTE — Telephone Encounter (Signed)
Patients insurance is active and benefits verified through [facility] - $30.00 co-pay, no deductible, out of pocket amount of $4,500/$1,201.92 has been met, no co-insurance, and no pre-authorization is required. Reference #[id number]  Patient will be contacted for scheduling.

## 2017-04-24 NOTE — Assessment & Plan Note (Signed)
PFTs consistent with emphysema except that he is a Never smoker and CT chest does not demonstrate this.  We will treat as asthma for now  Trial of BREO once daily -call for prescription of this works. Use albuterol 2 puffs prior to exercise.  Referral to pulmonary rehab.

## 2017-04-24 NOTE — Assessment & Plan Note (Signed)
CPAP seems to be helping subjectively and we will continue this.  He has good compliance. We will fine tune of settings to auto CPAP 5-10 cm based on his download

## 2017-04-24 NOTE — Progress Notes (Signed)
Subjective:    Patient ID: Daniel Reyes, male    DOB: [birth year], 71 y.o.   MRN: [id number]  HPI  71 year old Never smoker For follow-up of dyspnea on exertion x2-3 yrs   Chief Complaint  Patient presents with  . Follow-up    6 week f/u for CPAP start. Patient states he can tell a difference but wife reports he is more active during the day.    He continues to complain of dyspnea on exertion.  He was able to go on a cruise.  Wife reports that he gets dyspneic after walking for 15 minutes on level ground.  He does feel that he is better compared to his last visit.  He had a negative cardiac evaluation.  TSH and cortisol levels were normal.  He was not orthostatic on last visit.  Objectively we have demonstrated one episode of desaturation on climbing stairs and PFTs showed some reversibility with decreased DLCO.  We reviewed all testing done since last visit including CT, blood work, PFTs and home sleep study today which showed mild OSA.  Based on this he was started on CPAP and wife feels that he is doing somewhat better Subjectively, he does not feel any better. CPAP download shows AHI of 7, residual without any obstructive events and a few central events and excellent compliance with average pressure of 10 cm on auto settings.  He prefers nasal pillows  Patient and his wife had lots of questions all of which were answered   Significant tests/ events reviewed  Nocturnal oximetry 01/2017  >> showed 10.9 minutes of oxygen desaturation less than 88% with oxygen desaturation index of 25 over an 8.5 hours of monitoring, his average pulse was 54/m With lowest pulse documented as 47.   Echo 01/2017 did not show any evidence of pulmonary hypertension. Exercise stress test showed a hypotensive response  PFTs showed ratio 69 with FEV1 of 84% and FVC of 89% with significant broncho-dilator response, TLC was preserved at 55% and DLCO was decreased at 52% corrected for alveolar volume to  62%.  Orthostatics  normal. He desaturated to 79% on walking up and down the stairs with heart rate increased to 11 9/m and recovered within 2 minutes. He did not desaturate when walking on level ground  02/2017 CT angio neg PE< neg fibrosis   02/2017  Home sleep study showed mild OSA with AHI of 12/hour His desaturation was mild-only 18 minutes with saturation less than 90%   Past Medical History:  Diagnosis Date  . Hyperlipemia   . Mild CAD    a. cath 02/09/17 - Mild to moderate proximal and mid LAD plaque with less than 40% narrowing  . Nocturnal hypoxia      Review of Systems neg for any significant sore throat, dysphagia, itching, sneezing, nasal congestion or excess/ purulent secretions, fever, chills, sweats, unintended wt loss, pleuritic or exertional cp, hempoptysis, orthopnea pnd or change in chronic leg swelling. Also denies presyncope, palpitations, heartburn, abdominal pain, nausea, vomiting, diarrhea or change in bowel or urinary habits, dysuria,hematuria, rash, arthralgias, visual complaints, headache, numbness weakness or ataxia.     Objective:   Physical Exam   Gen. Pleasant, well-nourished, in no distress ENT - no thrush, no post nasal drip Neck: No JVD, no thyromegaly, no carotid bruits Lungs: no use of accessory muscles, no dullness to percussion, clear without rales or rhonchi  Cardiovascular: Rhythm regular, heart sounds  normal, no murmurs or gallops, no peripheral edema Musculoskeletal: No deformities,  no cyanosis or clubbing         Assessment & Plan:

## 2017-04-28 ENCOUNTER — Ambulatory Visit: Payer: Medicare HMO | Admitting: Pulmonary Disease

## 2017-05-08 DIAGNOSIS — R69 Illness, unspecified: Secondary | ICD-10-CM | POA: Diagnosis not present

## 2017-05-10 ENCOUNTER — Telehealth: Payer: Self-pay | Admitting: Pulmonary Disease

## 2017-05-10 MED ORDER — FLUTICASONE FUROATE-VILANTEROL 100-25 MCG/INH IN AEPB: 1.0000 | INHALATION_SPRAY | Freq: Every day | RESPIRATORY_TRACT | 3 refills | Status: DC

## 2017-05-10 NOTE — Telephone Encounter (Signed)
Called and spoke to pt. Pt request Rx for Breo, as he feels this medications is effective.  Rx has been sent to preferred pharmacy. Nothing further needed.

## 2017-05-12 ENCOUNTER — Telehealth: Payer: Self-pay | Admitting: Pulmonary Disease

## 2017-05-12 NOTE — Telephone Encounter (Signed)
Spoke with pt, he states the [place] was too expensive. I advised him to get the formulary from [place] and call us back to see if there are other alternatives that are cheaper. He said he would do so since he has 3 month supply of Breo. Nothing further is needed.

## 2017-05-15 DIAGNOSIS — G4733 Obstructive sleep apnea (adult) (pediatric): Secondary | ICD-10-CM | POA: Diagnosis not present

## 2017-05-15 DIAGNOSIS — R69 Illness, unspecified: Secondary | ICD-10-CM | POA: Diagnosis not present

## 2017-05-16 DIAGNOSIS — H52203 Unspecified astigmatism, bilateral: Secondary | ICD-10-CM | POA: Diagnosis not present

## 2017-05-16 DIAGNOSIS — Z83511 Family history of glaucoma: Secondary | ICD-10-CM | POA: Diagnosis not present

## 2017-05-16 DIAGNOSIS — H2513 Age-related nuclear cataract, bilateral: Secondary | ICD-10-CM | POA: Diagnosis not present

## 2017-05-16 DIAGNOSIS — H524 Presbyopia: Secondary | ICD-10-CM | POA: Diagnosis not present

## 2017-05-16 DIAGNOSIS — H35371 Puckering of macula, right eye: Secondary | ICD-10-CM | POA: Diagnosis not present

## 2017-05-16 DIAGNOSIS — H53022 Refractive amblyopia, left eye: Secondary | ICD-10-CM | POA: Diagnosis not present

## 2017-05-16 DIAGNOSIS — H02831 Dermatochalasis of right upper eyelid: Secondary | ICD-10-CM | POA: Diagnosis not present

## 2017-05-16 DIAGNOSIS — H40023 Open angle with borderline findings, high risk, bilateral: Secondary | ICD-10-CM | POA: Diagnosis not present

## 2017-05-16 DIAGNOSIS — H02834 Dermatochalasis of left upper eyelid: Secondary | ICD-10-CM | POA: Diagnosis not present

## 2017-05-16 DIAGNOSIS — H43392 Other vitreous opacities, left eye: Secondary | ICD-10-CM | POA: Diagnosis not present

## 2017-05-17 DIAGNOSIS — G4733 Obstructive sleep apnea (adult) (pediatric): Secondary | ICD-10-CM | POA: Diagnosis not present

## 2017-05-25 ENCOUNTER — Encounter: Payer: Self-pay | Admitting: Pulmonary Disease

## 2017-05-26 ENCOUNTER — Telehealth: Payer: Self-pay | Admitting: Pulmonary Disease

## 2017-05-26 DIAGNOSIS — Z9989 Dependence on other enabling machines and devices: Secondary | ICD-10-CM

## 2017-05-26 DIAGNOSIS — G4733 Obstructive sleep apnea (adult) (pediatric): Secondary | ICD-10-CM

## 2017-05-26 NOTE — Telephone Encounter (Signed)
Spoke with pt, he states his pressure may be too high and RA stated he was going to decrease the pressure when he was seen last. He feels too much air in his mouth causing pt not to sleep at all. Download in your look at folder for review. Please advise.    Settings: auto 5-10cm

## 2017-05-29 ENCOUNTER — Ambulatory Visit (INDEPENDENT_AMBULATORY_CARE_PROVIDER_SITE_OTHER): Payer: Medicare HMO | Admitting: Family Medicine

## 2017-05-29 ENCOUNTER — Encounter: Payer: Self-pay | Admitting: Family Medicine

## 2017-05-29 DIAGNOSIS — M545 Low back pain, unspecified: Secondary | ICD-10-CM | POA: Insufficient documentation

## 2017-05-29 DIAGNOSIS — K22719 Barrett's esophagus with dysplasia, unspecified: Secondary | ICD-10-CM

## 2017-05-29 DIAGNOSIS — Z23 Encounter for immunization: Secondary | ICD-10-CM | POA: Diagnosis not present

## 2017-05-29 DIAGNOSIS — D649 Anemia, unspecified: Secondary | ICD-10-CM | POA: Diagnosis not present

## 2017-05-29 DIAGNOSIS — Z9989 Dependence on other enabling machines and devices: Secondary | ICD-10-CM | POA: Diagnosis not present

## 2017-05-29 DIAGNOSIS — Z8619 Personal history of other infectious and parasitic diseases: Secondary | ICD-10-CM

## 2017-05-29 DIAGNOSIS — E782 Mixed hyperlipidemia: Secondary | ICD-10-CM | POA: Diagnosis not present

## 2017-05-29 DIAGNOSIS — G4733 Obstructive sleep apnea (adult) (pediatric): Secondary | ICD-10-CM

## 2017-05-29 DIAGNOSIS — I2510 Atherosclerotic heart disease of native coronary artery without angina pectoris: Secondary | ICD-10-CM

## 2017-05-29 DIAGNOSIS — R739 Hyperglycemia, unspecified: Secondary | ICD-10-CM

## 2017-05-29 DIAGNOSIS — K2270 Barrett's esophagus without dysplasia: Secondary | ICD-10-CM | POA: Insufficient documentation

## 2017-05-29 MED ORDER — ATORVASTATIN CALCIUM 20 MG PO TABS: 20.0000 mg | ORAL_TABLET | Freq: Two times a day (BID) | ORAL | 3 refills | Status: DC

## 2017-05-29 MED ORDER — AMLODIPINE BESYLATE 2.5 MG PO TABS: 2.5000 mg | ORAL_TABLET | Freq: Every day | ORAL | 0 refills | Status: DC

## 2017-05-29 MED ORDER — ASPIRIN 81 MG PO TBEC: 81.0000 mg | DELAYED_RELEASE_TABLET | Freq: Every day | ORAL | 3 refills | Status: DC

## 2017-05-29 NOTE — Telephone Encounter (Signed)
He is set on auto settings 5-15 cm and needs average pressure of 10 cm with residual AHI 6/hour mild leak   -decrease to auto settings 5-12 cm

## 2017-05-29 NOTE — Assessment & Plan Note (Signed)
Encouraged moist heat and gentle stretching as tolerated. May try NSAIDs and prescription meds as directed and report if symptoms worsen or seek immediate care 

## 2017-05-29 NOTE — Assessment & Plan Note (Signed)
Notes he is essentially asymptomatic s/p his surgery

## 2017-05-29 NOTE — Patient Instructions (Addendum)
Shingrix is the new shingles shot 2 shots 2-6 months apart, at pharmacy.  Cholesterol Cholesterol is a white, waxy, fat-like substance that is needed by the human body in small amounts. The liver makes all the cholesterol we need. Cholesterol is carried from the liver by the blood through the blood vessels. Deposits of cholesterol (plaques) may build up on blood vessel (artery) walls. Plaques make the arteries narrower and stiffer. Cholesterol plaques increase the risk for heart attack and stroke. You cannot feel your cholesterol level even if it is very high. The only way to know that it is high is to have a blood test. Once you know your cholesterol levels, you should keep a record of the test results. Work with your health care provider to keep your levels in the desired range. What do the results mean?  Total cholesterol is a rough measure of all the cholesterol in your blood.  LDL (low-density lipoprotein) is the "bad" cholesterol. This is the type that causes plaque to build up on the artery walls. You want this level to be low.  HDL (high-density lipoprotein) is the "good" cholesterol because it cleans the arteries and carries the LDL away. You want this level to be high.  Triglycerides are fat that the body can either burn for energy or store. High levels are closely linked to heart disease. What are the desired levels of cholesterol?  Total cholesterol below 200.  LDL below 100 for people who are at risk, below 70 for people at very high risk.  HDL above 40 is good. A level of 60 or higher is considered to be protective against heart disease.  Triglycerides below 150. How can I lower my cholesterol? Diet Follow your diet program as told by your health care provider.  Choose fish or white meat chicken and [place], roasted or baked. Limit fatty cuts of red meat, fried foods, and processed meats, such as sausage and lunch meats.  Eat lots of fresh fruits and vegetables.  Choose  whole grains, beans, pasta, potatoes, and cereals.  Choose olive oil, corn oil, or canola oil, and use only small amounts.  Avoid butter, mayonnaise, shortening, or palm kernel oils.  Avoid foods with trans fats.  Drink skim or nonfat milk and eat low-fat or nonfat yogurt and cheeses. Avoid whole milk, cream, ice cream, egg yolks, and full-fat cheeses.  Healthier desserts include angel food cake, ginger snaps, animal crackers, hard candy, popsicles, and low-fat or nonfat frozen yogurt. Avoid pastries, cakes, pies, and cookies.  Exercise  Follow your exercise program as told by your health care provider. A regular program: ? Helps to decrease LDL and raise HDL. ? Helps with weight control.  Do things that increase your activity level, such as gardening, walking, and taking the stairs.  Ask your health care provider about ways that you can be more active in your daily life.  Medicine  Take over-the-counter and prescription medicines only as told by your health care provider. ? Medicine may be prescribed by your health care provider to help lower cholesterol and decrease the risk for heart disease. This is usually done if diet and exercise have failed to bring down cholesterol levels. ? If you have several risk factors, you may need medicine even if your levels are normal.  This information is not intended to replace advice given to you by your health care provider. Make sure you discuss any questions you have with your health care provider. Document Released: 02/22/2001 Document Revised:  12/26/2015 Document Reviewed: 11/28/2015 Elsevier Interactive Patient Education  2017 [employer].

## 2017-05-29 NOTE — Progress Notes (Signed)
Subjective:  I acted as a Education administrator for Dr. Charlett Blake. Daniel Reyes, [place]  Patient ID: Daniel Reyes, male    DOB: May 08, 1946, 71 y.o.   MRN: [id number]  No chief complaint on file.   HPI  Patient is in today to establish care as a new patient.  He reports feeling well today.  He notes earlier in the year he was having significant difficulty with chest pain or shortness of breath but with the addition of Brio and his regular CPAP use he feels much better.  He denies any acute complaints.  He has had no recent febrile illness or hospitalizations.  He continues to follow with pulmonology. Denies CP/palp/SOB/HA/congestion/fevers/GI or GU c/o. Taking meds as prescribed  Patient Care Team: Mosie Lukes, MD as PCP - General (Family Medicine)   Past Medical History:  Diagnosis Date  . History of chicken pox   . Hyperlipemia   . Mild CAD    a. cath 02/09/17 - Mild to moderate proximal and mid LAD plaque with less than 40% narrowing  . Nocturnal hypoxia     Past Surgical History:  Procedure Laterality Date  . BACK SURGERY  1980  . CHOLECYSTECTOMY OPEN  1998  . gerd surgery    . NECK SURGERY  1983  . RIGHT/LEFT HEART CATH AND CORONARY ANGIOGRAPHY N/A 02/09/2017   Procedure: RIGHT/LEFT HEART CATH AND CORONARY ANGIOGRAPHY;  Surgeon: Belva Crome, MD;  Location: [facility];  Service: Cardiovascular;  Laterality: N/A;  . TONSILLECTOMY AND ADENOIDECTOMY      Family History  Problem Relation Age of Onset  . Heart attack Father   . Colon cancer Father   . Cancer Father   . Alzheimer's disease Mother   . Stroke Brother   . Heart disease Brother        afib  . Stroke Maternal Grandmother   . Mental illness Maternal Grandfather        suicide  . Alzheimer's disease Paternal Grandmother   . Peripheral vascular disease Paternal Grandfather   . Alcohol abuse Neg Hx     Social History   Socioeconomic History  . Marital status: Married    Spouse name: Not on file  . Number of children:  3  . Years of education: Not on file  . Highest education level: Not on file  Social Needs  . Financial resource strain: Not on file  . Food insecurity - worry: Not on file  . Food insecurity - inability: Not on file  . Transportation needs - medical: Not on file  . Transportation needs - non-medical: Not on file  Occupational History  . 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 Smoking status: Never Smoker  . Smokeless tobacco: Never Used  Substance and Sexual Activity  . Alcohol use: Yes    Comment: very rarely  . Drug use: No  . Sexual activity: Not on file  Other Topics Concern  . Not on file  Social History Narrative   Lives with wife, retired Pharmacist, community, no major dietary restrictions, negative cigarettes. Rare alcohol, wears seat belt most of the time    Outpatient Medications Prior to Visit  Medication Sig Dispense Refill  . albuterol (PROVENTIL HFA;VENTOLIN HFA) 108 (90 Base) MCG/ACT inhaler Inhale 2 puffs into the lungs every 6 (six) hours as needed for wheezing or shortness of breath. 1 Inhaler 2  . fluticasone furoate-vilanterol (BREO ELLIPTA) 100-25 MCG/INH AEPB Inhale 1 puff into the lungs  daily. 3 each 3  . amLODipine (NORVASC) 2.5 MG tablet Take 1 tablet (2.5 mg total) by mouth daily. 30 tablet 6  . aspirin EC 81 MG EC tablet Take 1 tablet (81 mg total) by mouth daily. 30 tablet 6  . atorvastatin (LIPITOR) 40 MG tablet Take 1 tablet (40 mg total) by mouth daily. 90 tablet 3   No facility-administered medications prior to visit.     Allergies  Allergen Reactions  . Imdur [Isosorbide Dinitrate] Anaphylaxis and Nausea And Vomiting    Nose swelling with difficulty breathing thru nose    Review of Systems  Constitutional: Negative for fever and malaise/fatigue.  HENT: Negative for congestion.   Eyes: Negative for blurred vision.  Respiratory: Negative for cough and shortness of breath.   Cardiovascular: Negative for  chest pain, palpitations and leg swelling.  Gastrointestinal: Negative for vomiting.  Musculoskeletal: Negative for back pain.  Skin: Negative for rash.  Neurological: Negative for loss of consciousness and headaches.       Objective:    Physical Exam  Constitutional: He is oriented to person, place, and time. He appears well-developed and well-nourished. No distress.  HENT:  Head: Normocephalic and atraumatic.  Eyes: Conjunctivae are normal.  Neck: Normal range of motion. No thyromegaly present.  Cardiovascular: Normal rate and regular rhythm.  Pulmonary/Chest: Effort normal and breath sounds normal. He has no wheezes.  Abdominal: Soft. Bowel sounds are normal. There is no tenderness.  Musculoskeletal: Normal range of motion. He exhibits no edema or deformity.  Neurological: He is alert and oriented to person, place, and time.  Skin: Skin is warm and dry. He is not diaphoretic.  Psychiatric: He has a normal mood and affect.    BP 102/66 (BP Location: Left Arm, Patient Position: Sitting, Cuff Size: Normal)   Pulse (!) 58   Temp 97.8 F (36.6 C) (Oral)   Resp 18   Ht 5\' 9"  (1.753 m)   Wt 183 lb 9.6 oz (83.3 kg)   SpO2 98%   BMI 27.11 kg/m  Wt Readings from Last 3 Encounters:  05/29/17 183 lb 9.6 oz (83.3 kg)  04/24/17 185 lb 9.6 oz (84.2 kg)  03/02/17 186 lb (84.4 kg)   BP Readings from Last 3 Encounters:  05/29/17 102/66  04/24/17 118/70  03/02/17 114/60     Immunization History  Administered Date(s) Administered  . Influenza, High Dose Seasonal PF 04/24/2017  . Influenza,inj,Quad PF,6+ Mos 02/14/2013  . Influenza-Unspecified 05/11/2015  . Pneumococcal Conjugate-13 06/01/2015  . Pneumococcal Polysaccharide-23 02/14/2013  . Td 05/29/2017    Health Maintenance  Topic Date Due  . Hepatitis C Screening  10-Sep-1945  . TETANUS/TDAP  06/14/2019  . COLONOSCOPY  01/11/2025  . INFLUENZA VACCINE  Completed  . PNA vac Low Risk Adult  Completed    Lab Results    Component Value Date   WBC 4.2 02/10/2017   HGB 11.7 (L) 02/10/2017   HCT 33.4 (L) 02/10/2017   PLT 185 02/10/2017   GLUCOSE 109 (H) 02/10/2017   CHOL 223 (H) 02/09/2017   TRIG 193 (H) 02/09/2017   HDL 31 (L) 02/09/2017   LDLCALC 153 (H) 02/09/2017   ALT 25 01/05/2017   AST 28 01/05/2017   NA 138 02/10/2017   K 4.2 02/10/2017   CL 111 02/10/2017   CREATININE 1.04 02/10/2017   BUN 12 02/10/2017   CO2 21 (L) 02/10/2017   TSH 1.437 02/08/2017   PSA 0.43 01/05/2017   INR 1.05 02/08/2017  HGBA1C 5.6 02/08/2017    Lab Results  Component Value Date   TSH 1.437 02/08/2017   Lab Results  Component Value Date   WBC 4.2 02/10/2017   HGB 11.7 (L) 02/10/2017   HCT 33.4 (L) 02/10/2017   MCV 79.7 02/10/2017   PLT 185 02/10/2017   Lab Results  Component Value Date   NA 138 02/10/2017   K 4.2 02/10/2017   CO2 21 (L) 02/10/2017   GLUCOSE 109 (H) 02/10/2017   BUN 12 02/10/2017   CREATININE 1.04 02/10/2017   BILITOT 0.6 01/05/2017   ALKPHOS 42 01/05/2017   AST 28 01/05/2017   ALT 25 01/05/2017   PROT 7.2 01/05/2017   ALBUMIN 4.1 01/05/2017   CALCIUM 8.9 02/10/2017   ANIONGAP 6 02/10/2017   GFR 75.04 01/05/2017   Lab Results  Component Value Date   CHOL 223 (H) 02/09/2017   Lab Results  Component Value Date   HDL 31 (L) 02/09/2017   Lab Results  Component Value Date   LDLCALC 153 (H) 02/09/2017   Lab Results  Component Value Date   TRIG 193 (H) 02/09/2017   Lab Results  Component Value Date   CHOLHDL 7.2 02/09/2017   Lab Results  Component Value Date   HGBA1C 5.6 02/08/2017         Assessment & Plan:   Problem List Items Addressed This Visit    Hyperlipidemia    Encouraged heart healthy diet, increase exercise, avoid trans fats, consider a krill oil cap daily      Relevant Medications   amLODipine (NORVASC) 2.5 MG tablet   atorvastatin (LIPITOR) 20 MG tablet   aspirin 81 MG EC tablet   Other Relevant Orders   Lipid panel   TSH   OSA on  CPAP    Follows with Dr Elsworth Soho, using Cpap nightly, uses Breo with good results      Mild CAD    Tolerating Amlodipine       Relevant Medications   amLODipine (NORVASC) 2.5 MG tablet   atorvastatin (LIPITOR) 20 MG tablet   aspirin 81 MG EC tablet   Other Relevant Orders   Comprehensive metabolic panel   TSH   Hyperglycemia    hgba1c acceptable, minimize simple carbs. Increase exercise as tolerated. Continue current meds      Relevant Orders   Hemoglobin A1c   TSH   Anemia    Increase leafy greens, consider increased lean red meat and using cast iron cookware. Continue to monitor, report any concerns      Relevant Orders   CBC   History of chicken pox   Barretts esophagus    Notes he is essentially asymptomatic s/p his surgery      Low back pain    Encouraged moist heat and gentle stretching as tolerated. May try NSAIDs and prescription meds as directed and report if symptoms worsen or seek immediate care      Relevant Medications   aspirin 81 MG EC tablet      I have discontinued Everlene Other. Raymundo's atorvastatin. I have also changed his aspirin. Additionally, I am having him start on atorvastatin. Lastly, I am having him maintain his albuterol, fluticasone furoate-vilanterol, and amLODipine.  Meds ordered this encounter  Medications  . amLODipine (NORVASC) 2.5 MG tablet    Sig: Take 1 tablet (2.5 mg total) by mouth daily.    Dispense:  30 tablet    Refill:  0  . atorvastatin (LIPITOR) 20 MG tablet  Sig: Take 1 tablet (20 mg total) by mouth 2 (two) times daily.    Dispense:  90 tablet    Refill:  3  . aspirin 81 MG EC tablet    Sig: Take 1 tablet (81 mg total) by mouth daily.    Dispense:  90 tablet    Refill:  3    CMA served as scribe during this visit. History, Physical and Plan performed by medical provider. Documentation and orders reviewed and attested to.  Penni Homans, MD

## 2017-05-29 NOTE — Assessment & Plan Note (Signed)
hgba1c acceptable, minimize simple carbs. Increase exercise as tolerated. Continue current meds 

## 2017-05-29 NOTE — Assessment & Plan Note (Signed)
Tolerating Amlodipine

## 2017-05-29 NOTE — Assessment & Plan Note (Signed)
Increase leafy greens, consider increased lean red meat and using cast iron cookware. Continue to monitor, report any concerns 

## 2017-05-29 NOTE — Assessment & Plan Note (Addendum)
Follows with Dr Elsworth Soho, using Cpap nightly, uses Breo with good results

## 2017-05-29 NOTE — Assessment & Plan Note (Signed)
Encouraged heart healthy diet, increase exercise, avoid trans fats, consider a krill oil cap daily 

## 2017-05-30 LAB — CBC
HEMATOCRIT: 38.5 % — AB (ref 39.0–52.0)
Hemoglobin: 12.6 g/dL — ABNORMAL LOW (ref 13.0–17.0)
MCHC: 32.9 g/dL (ref 30.0–36.0)
MCV: 85.4 fl (ref 78.0–100.0)
Platelets: 208 10*3/uL (ref 150.0–400.0)
RBC: 4.5 Mil/uL (ref 4.22–5.81)
RDW: 14.3 % (ref 11.5–15.5)
WBC: 6.3 10*3/uL (ref 4.0–10.5)

## 2017-05-30 LAB — COMPREHENSIVE METABOLIC PANEL
ALK PHOS: 59 U/L (ref 39–117)
ALT: 28 U/L (ref 0–53)
AST: 23 U/L (ref 0–37)
Albumin: 4.3 g/dL (ref 3.5–5.2)
BUN: 31 mg/dL — AB (ref 6–23)
CO2: 26 meq/L (ref 19–32)
Calcium: 9 mg/dL (ref 8.4–10.5)
Chloride: 108 mEq/L (ref 96–112)
Creatinine, Ser: 1.1 mg/dL (ref 0.40–1.50)
GFR: 70.25 mL/min (ref >60.00)
GLUCOSE: 89 mg/dL (ref 70–99)
POTASSIUM: 4.4 meq/L (ref 3.5–5.1)
SODIUM: 142 meq/L (ref 135–145)
TOTAL PROTEIN: 7.3 g/dL (ref 6.0–8.3)
Total Bilirubin: 0.5 mg/dL (ref 0.2–1.2)

## 2017-05-30 LAB — LIPID PANEL
CHOL/HDL RATIO: 2
Cholesterol: 137 mg/dL (ref 0–200)
HDL: 54.9 mg/dL (ref >39.00)
LDL CALC: 61 mg/dL (ref 0–99)
NONHDL: 82.1
Triglycerides: 105 mg/dL (ref 0.0–149.0)
VLDL: 21 mg/dL (ref 0.0–40.0)

## 2017-05-30 LAB — TSH: TSH: 1.58 u[IU]/mL (ref 0.35–4.50)

## 2017-05-30 LAB — HEMOGLOBIN A1C: HEMOGLOBIN A1C: 5.7 % (ref 4.6–6.5)

## 2017-05-30 NOTE — Telephone Encounter (Signed)
Pt is calling back in regards to the pressure change for his cpap. Best number is [phone].

## 2017-05-30 NOTE — Telephone Encounter (Signed)
Spoke with pt's wife, Ivin Booty. She is aware that we have made this pressure change. Nothing further was needed.

## 2017-05-30 NOTE — Telephone Encounter (Signed)
Order placed to change pressure.  lmtcb X1 to make pt aware.

## 2017-06-13 DIAGNOSIS — Z950 Presence of cardiac pacemaker: Secondary | ICD-10-CM

## 2017-06-13 HISTORY — DX: Presence of cardiac pacemaker: Z95.0

## 2017-06-15 DIAGNOSIS — G4733 Obstructive sleep apnea (adult) (pediatric): Secondary | ICD-10-CM | POA: Diagnosis not present

## 2017-06-28 DIAGNOSIS — G4733 Obstructive sleep apnea (adult) (pediatric): Secondary | ICD-10-CM | POA: Diagnosis not present

## 2017-06-29 ENCOUNTER — Telehealth (HOSPITAL_COMMUNITY): Payer: Self-pay

## 2017-06-29 NOTE — Telephone Encounter (Signed)
Attempted to call patient in regards to Pulmonary Rehab - Lm on vm °

## 2017-07-06 ENCOUNTER — Telehealth (HOSPITAL_COMMUNITY): Payer: Self-pay

## 2017-07-06 NOTE — Telephone Encounter (Signed)
Called and spoke with patient in regards to Pulmonary Rehab - Patient would like for me to call in May to schedule as patient will be going out of town over the next few months.

## 2017-07-10 ENCOUNTER — Ambulatory Visit: Payer: Medicare HMO | Admitting: Internal Medicine

## 2017-07-16 DIAGNOSIS — G4733 Obstructive sleep apnea (adult) (pediatric): Secondary | ICD-10-CM | POA: Diagnosis not present

## 2017-08-01 ENCOUNTER — Other Ambulatory Visit: Payer: Self-pay | Admitting: Physician Assistant

## 2017-08-02 DIAGNOSIS — R69 Illness, unspecified: Secondary | ICD-10-CM | POA: Diagnosis not present

## 2017-08-08 ENCOUNTER — Telehealth: Payer: Self-pay | Admitting: Pulmonary Disease

## 2017-08-08 NOTE — Telephone Encounter (Signed)
Called pt to let him know that RA had an opening on 08/11/17 at 3:15 but pt stated to me he will be out of town that day and will return back home Sunday, 08/13/17.  I stated to pt we would reach out to Dr. Elsworth Soho to see if he will be okay for [place] to double book pt at any particular date and time and once we hear from Dr. Elsworth Soho we would call him back.  Pt expressed understanding. Will await a message back from Dr. Elsworth Soho.  Dr. Elsworth Soho, please advise if there is anywhere on your schedule we can fit pt in. Thanks!

## 2017-08-09 NOTE — Telephone Encounter (Signed)
Can see him tomorrow at 1 30 pm Cherina, pl add office for me upto 3pm

## 2017-08-09 NOTE — Telephone Encounter (Signed)
Will add patient to the list.

## 2017-08-09 NOTE — Telephone Encounter (Signed)
Daniel Reyes, please put him on the list and we will make appointment for him after he gets back

## 2017-08-09 NOTE — Telephone Encounter (Signed)
Called pt who stated he is leaving tomorrow morning, 08/10/17 around 10am to go out of town and won't be back in town until Sunday.  Dr. Elsworth Soho, please advise if pt could be double booked at [facility] or if you would rather schedule an ov for him next Monday, 08/14/17.  Thanks!

## 2017-08-10 DIAGNOSIS — G4733 Obstructive sleep apnea (adult) (pediatric): Secondary | ICD-10-CM | POA: Diagnosis not present

## 2017-08-13 DIAGNOSIS — G4733 Obstructive sleep apnea (adult) (pediatric): Secondary | ICD-10-CM | POA: Diagnosis not present

## 2017-08-15 ENCOUNTER — Encounter: Payer: Self-pay | Admitting: Pulmonary Disease

## 2017-08-15 ENCOUNTER — Ambulatory Visit: Payer: Medicare HMO | Admitting: Pulmonary Disease

## 2017-08-15 DIAGNOSIS — J4530 Mild persistent asthma, uncomplicated: Secondary | ICD-10-CM

## 2017-08-15 DIAGNOSIS — J45909 Unspecified asthma, uncomplicated: Secondary | ICD-10-CM | POA: Insufficient documentation

## 2017-08-15 DIAGNOSIS — G4733 Obstructive sleep apnea (adult) (pediatric): Secondary | ICD-10-CM | POA: Diagnosis not present

## 2017-08-15 DIAGNOSIS — Z9989 Dependence on other enabling machines and devices: Secondary | ICD-10-CM | POA: Diagnosis not present

## 2017-08-15 MED ORDER — FLUTICASONE FUROATE-VILANTEROL 100-25 MCG/INH IN AEPB: 1.0000 | INHALATION_SPRAY | Freq: Every day | RESPIRATORY_TRACT | 0 refills | Status: DC

## 2017-08-15 MED ORDER — FLUTICASONE FUROATE-VILANTEROL 100-25 MCG/INH IN AEPB: 1.0000 | INHALATION_SPRAY | Freq: Every day | RESPIRATORY_TRACT | 3 refills | Status: DC

## 2017-08-15 NOTE — Assessment & Plan Note (Addendum)
Good response to Brio, will treat as mild persistent asthma.  Consider stepdown in 6 months if he is doing well.  Answered all wife's questions

## 2017-08-15 NOTE — Assessment & Plan Note (Signed)
Average pressure CPAP 10 cm seems to be working well.  He has had good improvement in his daytime somnolence and fatigue.   compliance with goal of at least 4-6 hrs every night is the expectation. Advised against medications with sedative side effects Cautioned against driving when sleepy - understanding that sleepiness will vary on a day to day basis

## 2017-08-15 NOTE — Patient Instructions (Signed)
STAY on Breo - refills x 90 d x 3

## 2017-08-15 NOTE — Progress Notes (Signed)
   Subjective:    Patient ID: Daniel Reyes, male    DOB: February 13, 1946, 72 y.o.   MRN: [id number]  HPI  72 year oldNever smoker For follow-up of dyspnea on exertionx2-3 yrs  Work-up : He had a negative cardiac evaluation.  TSH and cortisol levels were normal.  He was not orthostatic.  Objectively he had one episode of desaturation on climbing stairs and PFTs showed some reversibility with decreased DLCO.  On his last visit, he was started on Breo he feels that his dyspnea is significantly improved with corroborates it.  No restarted pulmonary.  -He has settled down with a CPAP machine and feels that this is also helped improve his daytime fatigue. CPAP download was reviewed which shows average pressure of 10 cm with no residual events and minimal leak, compliance is good about 6.5 hours every night  Wife had several questions about -Dose of Brio -Exposure to mold cats etc. In  her sisters house   Significant tests/ events reviewed  Nocturnal oximetry8/2018 >>showed 10.9 minutes of oxygen desaturation less than 88% with oxygen desaturation index of 25 over an 8.5 hours of monitoring, his average pulse was 54/m With lowest pulse documented as 47.  Echo 01/2017 did not show any evidence of pulmonary hypertension.Exercise stress test showed a hypotensive response  PFTs showed ratio 69 with FEV1 of 84% and FVC of 89% with significant broncho-dilator response, TLC was preserved at 55% and DLCO was decreased at 52% corrected for alveolar volume to 62%.  Orthostatics  normal. He desaturated to 79% on walking up and down the stairs with heart rate increased to 11 9/m and recovered within 2 minutes. He did not desaturate when walking on level ground  02/2017 CT angio neg PE< neg fibrosis   02/2017  Home sleep study showed mild OSA with AHI of 12/hour His desaturation was mild-only 18 minutes with saturation less than 90%   Review of Systems neg for any significant sore throat,  dysphagia, itching, sneezing, nasal congestion or excess/ purulent secretions, fever, chills, sweats, unintended wt loss, pleuritic or exertional cp, hempoptysis, orthopnea pnd or change in chronic leg swelling. Also denies presyncope, palpitations, heartburn, abdominal pain, nausea, vomiting, diarrhea or change in bowel or urinary habits, dysuria,hematuria, rash, arthralgias, visual complaints, headache, numbness weakness or ataxia.     Objective:   Physical Exam  Gen. Pleasant, well-nourished, in no distress ENT - no thrush, no post nasal drip Neck: No JVD, no thyromegaly, no carotid bruits Lungs: no use of accessory muscles, no dullness to percussion, clear without rales or rhonchi  Cardiovascular: Rhythm regular, heart sounds  normal, no murmurs or gallops, no peripheral edema Musculoskeletal: No deformities, no cyanosis or clubbing        Assessment & Plan:

## 2017-09-13 DIAGNOSIS — G4733 Obstructive sleep apnea (adult) (pediatric): Secondary | ICD-10-CM | POA: Diagnosis not present

## 2017-10-13 DIAGNOSIS — G4733 Obstructive sleep apnea (adult) (pediatric): Secondary | ICD-10-CM | POA: Diagnosis not present

## 2017-11-01 ENCOUNTER — Telehealth (HOSPITAL_COMMUNITY): Payer: Self-pay | Admitting: *Deleted

## 2017-11-01 NOTE — Telephone Encounter (Signed)
Called pt and left message to please contact for scheduling Pulmonary rehab. Cherre Huger, BSN Cardiac and Training and development officer

## 2017-11-03 ENCOUNTER — Telehealth: Payer: Self-pay | Admitting: Pulmonary Disease

## 2017-11-03 MED ORDER — ALBUTEROL SULFATE HFA 108 (90 BASE) MCG/ACT IN AERS: 2.0000 | INHALATION_SPRAY | Freq: Four times a day (QID) | RESPIRATORY_TRACT | 2 refills | Status: DC | PRN

## 2017-11-03 NOTE — Telephone Encounter (Signed)
Called and spoke with patient, he states that he needs his albuterol refilled. Verified pharmacy. Refill sent. Nothing further needed.

## 2017-11-13 DIAGNOSIS — G4733 Obstructive sleep apnea (adult) (pediatric): Secondary | ICD-10-CM | POA: Diagnosis not present

## 2017-11-21 ENCOUNTER — Telehealth (HOSPITAL_COMMUNITY): Payer: Self-pay

## 2017-11-21 NOTE — Telephone Encounter (Signed)
Called patient in regards to Pulmonary Rehab - Patient stated he is interested but he is currently out of town until the first week of July. He stated he is unsure when he would like to begin and he will call back. If no phone call from patient, will follow up.

## 2017-11-30 ENCOUNTER — Encounter: Payer: Self-pay | Admitting: Family Medicine

## 2017-11-30 ENCOUNTER — Ambulatory Visit (INDEPENDENT_AMBULATORY_CARE_PROVIDER_SITE_OTHER): Payer: Medicare HMO | Admitting: Family Medicine

## 2017-11-30 VITALS — BP 112/68 | HR 51 | Temp 98.0°F | Ht 69.0 in | Wt 200.2 lb

## 2017-11-30 DIAGNOSIS — Z9989 Dependence on other enabling machines and devices: Secondary | ICD-10-CM | POA: Diagnosis not present

## 2017-11-30 DIAGNOSIS — R351 Nocturia: Secondary | ICD-10-CM | POA: Diagnosis not present

## 2017-11-30 DIAGNOSIS — R252 Cramp and spasm: Secondary | ICD-10-CM

## 2017-11-30 DIAGNOSIS — R739 Hyperglycemia, unspecified: Secondary | ICD-10-CM

## 2017-11-30 DIAGNOSIS — R0600 Dyspnea, unspecified: Secondary | ICD-10-CM

## 2017-11-30 DIAGNOSIS — R0609 Other forms of dyspnea: Secondary | ICD-10-CM

## 2017-11-30 DIAGNOSIS — I2510 Atherosclerotic heart disease of native coronary artery without angina pectoris: Secondary | ICD-10-CM | POA: Diagnosis not present

## 2017-11-30 DIAGNOSIS — G4733 Obstructive sleep apnea (adult) (pediatric): Secondary | ICD-10-CM | POA: Diagnosis not present

## 2017-11-30 DIAGNOSIS — E782 Mixed hyperlipidemia: Secondary | ICD-10-CM

## 2017-11-30 DIAGNOSIS — L299 Pruritus, unspecified: Secondary | ICD-10-CM | POA: Diagnosis not present

## 2017-11-30 DIAGNOSIS — Z0000 Encounter for general adult medical examination without abnormal findings: Secondary | ICD-10-CM

## 2017-11-30 LAB — TSH: TSH: 1.31 u[IU]/mL (ref 0.35–4.50)

## 2017-11-30 LAB — COMPREHENSIVE METABOLIC PANEL
ALBUMIN: 4.1 g/dL (ref 3.5–5.2)
ALT: 26 U/L (ref 0–53)
AST: 17 U/L (ref 0–37)
Alkaline Phosphatase: 52 U/L (ref 39–117)
BUN: 24 mg/dL — AB (ref 6–23)
CHLORIDE: 106 meq/L (ref 96–112)
CO2: 28 mEq/L (ref 19–32)
Calcium: 9.1 mg/dL (ref 8.4–10.5)
Creatinine, Ser: 1.03 mg/dL (ref 0.40–1.50)
GFR: 75.68 mL/min (ref >60.00)
Glucose, Bld: 90 mg/dL (ref 70–99)
Potassium: 4 mEq/L (ref 3.5–5.1)
SODIUM: 142 meq/L (ref 135–145)
Total Bilirubin: 0.7 mg/dL (ref 0.2–1.2)
Total Protein: 6.7 g/dL (ref 6.0–8.3)

## 2017-11-30 LAB — CBC
HCT: 41.4 % (ref 39.0–52.0)
HEMOGLOBIN: 13.9 g/dL (ref 13.0–17.0)
MCHC: 33.5 g/dL (ref 30.0–36.0)
MCV: 86.7 fl (ref 78.0–100.0)
PLATELETS: 181 10*3/uL (ref 150.0–400.0)
RBC: 4.78 Mil/uL (ref 4.22–5.81)
RDW: 14.6 % (ref 11.5–15.5)
WBC: 8.1 10*3/uL (ref 4.0–10.5)

## 2017-11-30 LAB — HEMOGLOBIN A1C: HEMOGLOBIN A1C: 6.1 % (ref 4.6–6.5)

## 2017-11-30 LAB — LIPID PANEL
CHOLESTEROL: 169 mg/dL (ref 0–200)
HDL: 58.9 mg/dL (ref >39.00)
LDL Cholesterol: 85 mg/dL (ref 0–99)
NonHDL: 110.58
Total CHOL/HDL Ratio: 3
Triglycerides: 128 mg/dL (ref 0.0–149.0)
VLDL: 25.6 mg/dL (ref 0.0–40.0)

## 2017-11-30 LAB — PSA: PSA: 0.27 ng/mL (ref 0.10–4.00)

## 2017-11-30 LAB — MAGNESIUM: Magnesium: 2.2 mg/dL (ref 1.5–2.5)

## 2017-11-30 MED ORDER — ALBUTEROL SULFATE HFA 108 (90 BASE) MCG/ACT IN AERS: 2.0000 | INHALATION_SPRAY | Freq: Four times a day (QID) | RESPIRATORY_TRACT | 2 refills | Status: DC | PRN

## 2017-11-30 MED ORDER — FLUTICASONE FUROATE-VILANTEROL 100-25 MCG/INH IN AEPB: 1.0000 | INHALATION_SPRAY | Freq: Every day | RESPIRATORY_TRACT | 3 refills | Status: DC

## 2017-11-30 NOTE — Assessment & Plan Note (Signed)
Using CPAP 

## 2017-11-30 NOTE — Assessment & Plan Note (Signed)
Comes after being over heated. Try Zyrtec and Zantac twice daily, hydrate and cool shower. If worsens can use steroids as he has had to in past with good results.

## 2017-11-30 NOTE — Patient Instructions (Addendum)
HYLAND'S leg cramp med as needed and hydrate with 64 oz clear fluids daily Bring [place] a copy of your advanced directives Shingrix 2 shots over 2-6 months at [place] 65 Years and Older, Male Preventive care refers to lifestyle choices and visits with your health care provider that can promote health and wellness. What does preventive care include?  A yearly physical exam. This is also called an annual well check.  Dental exams once or twice a year.  Routine eye exams. Ask your health care provider how often you should have your eyes checked.  Personal lifestyle choices, including: ? Daily care of your teeth and gums. ? Regular physical activity. ? Eating a healthy diet. ? Avoiding tobacco and drug use. ? Limiting alcohol use. ? Practicing safe sex. ? Taking low doses of aspirin every day. ? Taking vitamin and mineral supplements as recommended by your health care provider. What happens during an annual well check? The services and screenings done by your health care provider during your annual well check will depend on your age, overall health, lifestyle risk factors, and family history of disease. Counseling Your health care provider may ask you questions about your:  Alcohol use.  Tobacco use.  Drug use.  Emotional well-being.  Home and relationship well-being.  Sexual activity.  Eating habits.  History of falls.  Memory and ability to understand (cognition).  Work and work Statistician.  Screening You may have the following tests or measurements:  Height, weight, and BMI.  Blood pressure.  Lipid and cholesterol levels. These may be checked every 5 years, or more frequently if you are over 38 years old.  Skin check.  Lung cancer screening. You may have this screening every year starting at age 71 if you have a 30-pack-year history of smoking and currently smoke or have quit within the past 71 years.  Fecal occult blood test (FOBT) of the  stool. You may have this test every year starting at age 71.  Flexible sigmoidoscopy or colonoscopy. You may have a sigmoidoscopy every 5 years or a colonoscopy every 10 years starting at age 71.  Prostate cancer screening. Recommendations will vary depending on your family history and other risks.  Hepatitis C blood test.  Hepatitis B blood test.  Sexually transmitted disease (STD) testing.  Diabetes screening. This is done by checking your blood sugar (glucose) after you have not eaten for a while (fasting). You may have this done every 1-3 years.  Abdominal aortic aneurysm (AAA) screening. You may need this if you are a current or former smoker.  Osteoporosis. You may be screened starting at age 71 if you are at high risk.  Talk with your health care provider about your test results, treatment options, and if necessary, the need for more tests. Vaccines Your health care provider may recommend certain vaccines, such as:  Influenza vaccine. This is recommended every year.  Tetanus, diphtheria, and acellular pertussis (Tdap, Td) vaccine. You may need a Td booster every 10 years.  Varicella vaccine. You may need this if you have not been vaccinated.  Zoster vaccine. You may need this after age 71.  Measles, mumps, and rubella (MMR) vaccine. You may need at least one dose of MMR if you were born in 1957 or later. You may also need a second dose.  Pneumococcal 13-valent conjugate (PCV13) vaccine. One dose is recommended after age 44.  Pneumococcal polysaccharide (PPSV23) vaccine. One dose is recommended after age 71.  Meningococcal vaccine. You  may need this if you have certain conditions.  Hepatitis A vaccine. You may need this if you have certain conditions or if you travel or work in places where you may be exposed to hepatitis A.  Hepatitis B vaccine. You may need this if you have certain conditions or if you travel or work in places where you may be exposed to hepatitis  B.  Haemophilus influenzae type b (Hib) vaccine. You may need this if you have certain risk factors.  Talk to your health care provider about which screenings and vaccines you need and how often you need them. This information is not intended to replace advice given to you by your health care provider. Make sure you discuss any questions you have with your health care provider. Document Released: 06/26/2015 Document Revised: 02/17/2016 Document Reviewed: 03/31/2015 Elsevier Interactive Patient Education  [employer].

## 2017-11-30 NOTE — Assessment & Plan Note (Signed)
Encouraged heart healthy diet, increase exercise, avoid trans fats, consider a krill oil cap daily 

## 2017-11-30 NOTE — Assessment & Plan Note (Signed)
hgba1c acceptable, minimize simple carbs. Increase exercise as tolerated.  

## 2017-11-30 NOTE — Assessment & Plan Note (Addendum)
Increase hydration and Hyland's leg cramps, magnesium and potassium wnl

## 2017-11-30 NOTE — Assessment & Plan Note (Signed)
Patient encouraged to maintain heart healthy diet, regular exercise, adequate sleep. Consider daily probiotics. Take medications as prescribed 

## 2017-11-30 NOTE — Progress Notes (Signed)
Pre visit review using our clinic review tool, if applicable. No additional management support is needed unless otherwise documented below in the visit note. 

## 2017-11-30 NOTE — Assessment & Plan Note (Signed)
Has been seen by cardiology. Will stop the Amlodipine and recheck pulse and blood pressure in 6 weeks or so

## 2017-11-30 NOTE — Assessment & Plan Note (Signed)
Doing well on Breo refill completed

## 2017-12-02 DIAGNOSIS — R351 Nocturia: Secondary | ICD-10-CM | POA: Insufficient documentation

## 2017-12-02 NOTE — Progress Notes (Signed)
Subjective:    Patient ID: Daniel Reyes, male    DOB: [birth year], 72 y.o.   MRN: [id number]  Chief Complaint  Patient presents with  . Annual Exam    HPI Patient is in today for annual preventative exam and chronic medical concerns including hyperlipidemia, mild CAD, hyperglycemia and SOB. He notes his breathing symptoms of SOB are much better on Breo. No recent febrile illness or hospitalizations. Is staying very active and maintains a heart healthy diet. Manages his activities of daily living well. Denies CP/palp/HA/congestion/fevers/GI or GU c/o. Taking meds as prescribed  Past Medical History:  Diagnosis Date  . History of chicken pox   . Hyperlipemia   . Mild CAD    a. cath 02/09/17 - Mild to moderate proximal and mid LAD plaque with less than 40% narrowing  . Nocturnal hypoxia     Past Surgical History:  Procedure Laterality Date  . BACK SURGERY  1980  . CHOLECYSTECTOMY OPEN  1998  . gerd surgery    . NECK SURGERY  1983  . RIGHT/LEFT HEART CATH AND CORONARY ANGIOGRAPHY N/A 02/09/2017   Procedure: RIGHT/LEFT HEART CATH AND CORONARY ANGIOGRAPHY;  Surgeon: Belva Crome, MD;  Location: [facility];  Service: Cardiovascular;  Laterality: N/A;  . TONSILLECTOMY AND ADENOIDECTOMY      Family History  Problem Relation Age of Onset  . Heart attack Father   . Colon cancer Father   . Cancer Father   . Alzheimer's disease Mother   . Stroke Brother   . Heart disease Brother        afib  . Stroke Maternal Grandmother   . Mental illness Maternal Grandfather        suicide  . Alzheimer's disease Paternal Grandmother   . Peripheral vascular disease Paternal Grandfather   . Alcohol abuse Neg Hx     Social History   Socioeconomic History  . Marital status: Married    Spouse name: Not on file  . Number of children: 3  . Years of education: Not on file  . Highest education level: Not on file  Occupational History  . Occupation: retired-owner of [employer]  . Financial resource strain: Not on file  . Food insecurity:    Worry: Not on file    Inability: Not on file  . Transportation needs:    Medical: Not on file    Non-medical: Not on file  Tobacco Use  . Smoking status: Never Smoker  . Smokeless tobacco: Never Used  Substance and Sexual Activity  . Alcohol use: Yes    Comment: very rarely  . Drug use: No  . Sexual activity: Not on file  Lifestyle  . Physical activity:    Days per week: Not on file    Minutes per session: Not on file  . Stress: Not on file  Relationships  . Social connections:    Talks on phone: Not on file    Gets together: Not on file    Attends religious service: Not on file    Active member of club or organization: Not on file    Attends meetings of clubs or organizations: Not on file    Relationship status: Not on file  . Intimate partner violence:    Fear of current or ex partner: Not on file    Emotionally abused: Not on file    Physically abused: Not on file    Forced sexual activity: Not on  file  Other Topics Concern  . Not on file  Social History Narrative   Lives with wife, retired Pharmacist, community, no major dietary restrictions, negative cigarettes. Rare alcohol, wears seat belt most of the time    Outpatient Medications Prior to Visit  Medication Sig Dispense Refill  . amLODipine (NORVASC) 2.5 MG tablet TAKE 1 TABLET BY MOUTH EVERY DAY 30 tablet 6  . aspirin 81 MG EC tablet Take 1 tablet (81 mg total) by mouth daily. 90 tablet 3  . atorvastatin (LIPITOR) 20 MG tablet Take 1 tablet (20 mg total) by mouth 2 (two) times daily. 90 tablet 3  . albuterol (PROVENTIL HFA;VENTOLIN HFA) 108 (90 Base) MCG/ACT inhaler Inhale 2 puffs into the lungs every 6 (six) hours as needed for wheezing or shortness of breath. 1 Inhaler 2  . fluticasone furoate-vilanterol (BREO ELLIPTA) 100-25 MCG/INH AEPB Inhale 1 puff into the lungs daily. 3 each 3  . fluticasone furoate-vilanterol (BREO  ELLIPTA) 100-25 MCG/INH AEPB Inhale 1 puff into the lungs daily. 1 each 0   No facility-administered medications prior to visit.     Allergies  Allergen Reactions  . Imdur [Isosorbide Dinitrate] Anaphylaxis and Nausea And Vomiting    Nose swelling with difficulty breathing thru nose    Review of Systems  Constitutional: Negative for chills, fever and malaise/fatigue.  HENT: Negative for congestion and hearing loss.   Eyes: Negative for discharge.  Respiratory: Negative for cough, sputum production and shortness of breath.   Cardiovascular: Negative for chest pain, palpitations and leg swelling.  Gastrointestinal: Negative for abdominal pain, blood in stool, constipation, diarrhea, heartburn, nausea and vomiting.  Genitourinary: Negative for dysuria, frequency, hematuria and urgency.  Musculoskeletal: Positive for myalgias. Negative for back pain and falls.  Skin: Negative for rash.  Neurological: Negative for dizziness, sensory change, loss of consciousness, weakness and headaches.  Endo/Heme/Allergies: Negative for environmental allergies. Does not bruise/bleed easily.  Psychiatric/Behavioral: Negative for depression and suicidal ideas. The patient is not nervous/anxious and does not have insomnia.        Objective:    Physical Exam  Constitutional: He is oriented to person, place, and time. He appears well-developed and well-nourished. No distress.  HENT:  Head: Normocephalic and atraumatic.  Eyes: Conjunctivae are normal.  Neck: Neck supple. No thyromegaly present.  Cardiovascular: Normal rate, regular rhythm and normal heart sounds.  No murmur heard. Pulmonary/Chest: Effort normal and breath sounds normal. No respiratory distress. He has no wheezes.  Abdominal: Soft. Bowel sounds are normal. He exhibits no mass. There is no tenderness.  Musculoskeletal: He exhibits no edema.  Lymphadenopathy:    He has no cervical adenopathy.  Neurological: He is alert and oriented to  person, place, and time.  Skin: Skin is warm and dry.  Psychiatric: He has a normal mood and affect. His behavior is normal.    BP 112/68 (BP Location: Left Arm, Patient Position: Sitting, Cuff Size: Normal)   Pulse (!) 51   Temp 98 F (36.7 C) (Oral)   Ht 5\' 9"  (1.753 m)   Wt 200 lb 4 oz (90.8 kg)   SpO2 97%   BMI 29.57 kg/m  Wt Readings from Last 3 Encounters:  11/30/17 200 lb 4 oz (90.8 kg)  08/15/17 192 lb 9.6 oz (87.4 kg)  05/29/17 183 lb 9.6 oz (83.3 kg)     Lab Results  Component Value Date   WBC 8.1 11/30/2017   HGB 13.9 11/30/2017   HCT 41.4 11/30/2017   PLT 181.0  11/30/2017   GLUCOSE 90 11/30/2017   CHOL 169 11/30/2017   TRIG 128.0 11/30/2017   HDL 58.90 11/30/2017   LDLCALC 85 11/30/2017   ALT 26 11/30/2017   AST 17 11/30/2017   NA 142 11/30/2017   K 4.0 11/30/2017   CL 106 11/30/2017   CREATININE 1.03 11/30/2017   BUN 24 (H) 11/30/2017   CO2 28 11/30/2017   TSH 1.31 11/30/2017   PSA 0.27 11/30/2017   INR 1.05 02/08/2017   HGBA1C 6.1 11/30/2017    Lab Results  Component Value Date   TSH 1.31 11/30/2017   Lab Results  Component Value Date   WBC 8.1 11/30/2017   HGB 13.9 11/30/2017   HCT 41.4 11/30/2017   MCV 86.7 11/30/2017   PLT 181.0 11/30/2017   Lab Results  Component Value Date   NA 142 11/30/2017   K 4.0 11/30/2017   CO2 28 11/30/2017   GLUCOSE 90 11/30/2017   BUN 24 (H) 11/30/2017   CREATININE 1.03 11/30/2017   BILITOT 0.7 11/30/2017   ALKPHOS 52 11/30/2017   AST 17 11/30/2017   ALT 26 11/30/2017   PROT 6.7 11/30/2017   ALBUMIN 4.1 11/30/2017   CALCIUM 9.1 11/30/2017   ANIONGAP 6 02/10/2017   GFR 75.68 11/30/2017   Lab Results  Component Value Date   CHOL 169 11/30/2017   Lab Results  Component Value Date   HDL 58.90 11/30/2017   Lab Results  Component Value Date   LDLCALC 85 11/30/2017   Lab Results  Component Value Date   TRIG 128.0 11/30/2017   Lab Results  Component Value Date   CHOLHDL 3 11/30/2017    Lab Results  Component Value Date   HGBA1C 6.1 11/30/2017       Assessment & Plan:   Problem List Items Addressed This Visit    Hyperlipidemia    Encouraged heart healthy diet, increase exercise, avoid trans fats, consider a krill oil cap daily      Relevant Orders   Lipid panel (Completed)   Routine general medical examination at a health care facility - Primary    Patient encouraged to maintain heart healthy diet, regular exercise, adequate sleep. Consider daily probiotics. Take medications as prescribed      Relevant Orders   CBC (Completed)   Comprehensive metabolic panel (Completed)   TSH (Completed)   OSA on CPAP    Using CPAP      DOE (dyspnea on exertion)    Doing well on Breo refill completed      Mild CAD    Has been seen by cardiology. Will stop the Amlodipine and recheck pulse and blood pressure in 6 weeks or so      Relevant Orders   Magnesium (Completed)   Hyperglycemia    hgba1c acceptable, minimize simple carbs. Increase exercise as tolerated.      Relevant Orders   Hemoglobin A1c (Completed)   TSH (Completed)   Muscle cramps    Increase hydration and Hyland's leg cramps, magnesium and potassium wnl      Pruritus    Comes after being over heated. Try Zyrtec and Zantac twice daily, hydrate and cool shower. If worsens can use steroids as he has had to in past with good results.       Nocturia   Relevant Orders   PSA (Completed)      I am having Everlene Other. Mcphee maintain his atorvastatin, aspirin, amLODipine, albuterol, and fluticasone furoate-vilanterol.  Meds ordered this encounter  Medications  . albuterol (PROVENTIL HFA;VENTOLIN HFA) 108 (90 Base) MCG/ACT inhaler    Sig: Inhale 2 puffs into the lungs every 6 (six) hours as needed for wheezing or shortness of breath.    Dispense:  1 Inhaler    Refill:  2  . fluticasone furoate-vilanterol (BREO ELLIPTA) 100-25 MCG/INH AEPB    Sig: Inhale 1 puff into the lungs daily.    Dispense:  3  each    Refill:  3    Order Specific Question:   Lot Number?    Answer:   [id number]    Order Specific Question:   Expiration Date?    Answer:   05/12/2018    Order Specific Question:   Quantity    Answer:   1     Penni Homans, MD

## 2017-12-13 DIAGNOSIS — G4733 Obstructive sleep apnea (adult) (pediatric): Secondary | ICD-10-CM | POA: Diagnosis not present

## 2017-12-18 ENCOUNTER — Encounter: Payer: Self-pay | Admitting: Pulmonary Disease

## 2017-12-18 ENCOUNTER — Ambulatory Visit: Payer: Medicare HMO | Admitting: Pulmonary Disease

## 2017-12-18 DIAGNOSIS — G4733 Obstructive sleep apnea (adult) (pediatric): Secondary | ICD-10-CM | POA: Diagnosis not present

## 2017-12-18 DIAGNOSIS — R0609 Other forms of dyspnea: Secondary | ICD-10-CM | POA: Diagnosis not present

## 2017-12-18 DIAGNOSIS — Z9989 Dependence on other enabling machines and devices: Secondary | ICD-10-CM | POA: Diagnosis not present

## 2017-12-18 DIAGNOSIS — R0600 Dyspnea, unspecified: Secondary | ICD-10-CM

## 2017-12-18 DIAGNOSIS — J4530 Mild persistent asthma, uncomplicated: Secondary | ICD-10-CM | POA: Diagnosis not present

## 2017-12-18 MED ORDER — FLUTICASONE FUROATE-VILANTEROL 100-25 MCG/INH IN AEPB: 1.0000 | INHALATION_SPRAY | Freq: Every day | RESPIRATORY_TRACT | 0 refills | Status: DC

## 2017-12-18 NOTE — Assessment & Plan Note (Signed)
Ct CPAP 10 cm 

## 2017-12-18 NOTE — Patient Instructions (Signed)
Call me to report symptoms in 3 months, if doing well we can decrease  to every other day.  Please enroll in pulmonary rehab in November/December

## 2017-12-18 NOTE — Assessment & Plan Note (Signed)
Call me to report symptoms in 3 months, if doing well we can decrease  to every other day with goal to taper to off  Please enroll in pulmonary rehab in November/December

## 2017-12-18 NOTE — Assessment & Plan Note (Addendum)
Wonder if he has some chronotropic incompetence to account for his dyspnea.- refer to EP Does not appear to be true asthma

## 2017-12-18 NOTE — Progress Notes (Signed)
   Subjective:    Patient ID: Daniel Reyes, male    DOB: 12-17-1945, 72 y.o.   MRN: [id number]  HPI  72 year oldNever smoker for follow-up of dyspnea on exertionx2-3 yrs  Work-up : He had a negative cardiac evaluation.TSH and cortisol levels were normal. He was not orthostatic. Objectively he had one episode of desaturation on climbing stairs and PFTs showed some reversibility with decreased DLCO.  He had subjective improvement with Breo and has been maintained on this.  About 2 weeks ago he felt that his symptoms of tiredness was returning and he had some pain between the shoulder blades, this is not occurred in the last few days  He continues to feel worse when he goes into his wife's sisters house and he wonders if this is something to do with allergies.  He was unable to enroll in pulmonary rehab since he is not in town some days of the week  On walking heart rate 24 54-60 2:05 laps oxygen level stayed good  Significant tests/ events reviewed  Nocturnal oximetry8/2018 >>showed 10.9 minutes of oxygen desaturation less than 88% with oxygen desaturation index of 25 over an 8.5 hours of monitoring, his average pulse was 54/m With lowest pulse documented as 47.  Echo 01/2017 did not show any evidence of pulmonary hypertension.Exercise stress test showed a hypotensive response  PFTs showed ratio 69 with FEV1 of 84% and FVC of 89% with significant broncho-dilator response, TLC was preserved at 55% and DLCO was decreased at 52% corrected for alveolar volume to 62%.  Orthostatics normal. He desaturated to 79% on walking up and down the stairs with heart rate increased to 11 9/m and recovered within 2 minutes. He did not desaturate when walking on level ground  02/2017 CT angio neg PE< neg fibrosis   9/2018Home sleep study showed mild OSA with AHI of 12/hour His desaturation was mild-only 18 minutes with saturation less than 90%  Review of Systems Patient denies  significant dyspnea,cough, hemoptysis,  chest pain, palpitations, pedal edema, orthopnea, paroxysmal nocturnal dyspnea, lightheadedness, nausea, vomiting, abdominal or  leg pains      Objective:   Physical Exam  Gen. Pleasant, well-nourished, in no distress ENT - no thrush, no post nasal drip Neck: No JVD, no thyromegaly, no carotid bruits Lungs: no use of accessory muscles, no dullness to percussion, clear without rales or rhonchi  Cardiovascular: Rhythm regular, heart sounds  normal, no murmurs or gallops, no peripheral edema Musculoskeletal: No deformities, no cyanosis or clubbing        Assessment & Plan:

## 2017-12-21 ENCOUNTER — Telehealth (HOSPITAL_COMMUNITY): Payer: Self-pay

## 2017-12-21 NOTE — Telephone Encounter (Signed)
Called to follow up with patient in regards to Pulmonary Rehab - Patient stated due to traveling a lot over the next several months, the time frame he would like to participate would be in December. I explained to patient that he will need to contact his Pulmonologist at that time to place a new order as this referral was placed back in November of 2018. Patient verbalized understanding. Closed referral.

## 2017-12-29 ENCOUNTER — Ambulatory Visit: Payer: Medicare HMO

## 2018-01-03 ENCOUNTER — Ambulatory Visit (INDEPENDENT_AMBULATORY_CARE_PROVIDER_SITE_OTHER): Payer: Medicare HMO | Admitting: Family Medicine

## 2018-01-03 ENCOUNTER — Telehealth: Payer: Self-pay

## 2018-01-03 VITALS — BP 135/85 | HR 57

## 2018-01-03 DIAGNOSIS — I2510 Atherosclerotic heart disease of native coronary artery without angina pectoris: Secondary | ICD-10-CM | POA: Diagnosis not present

## 2018-01-03 DIAGNOSIS — Z8679 Personal history of other diseases of the circulatory system: Secondary | ICD-10-CM

## 2018-01-03 NOTE — Progress Notes (Addendum)
Pre visit review using our clinic tool,if applicable. No additional management support is needed unless otherwise documented below in the visit note.   Pt here for Blood pressure check per order from Dr. Charlett Blake dated 11/30/17.  Pt currently takes no BP medications at this time. Amlodipine 2.5 mg discontinued 11/30/17  Pt reports compliance with all other  medications.  BP today =  135/85 P = 57  Pt advised per Dr. Lorelei Pont DOD, no changes on today. Check BP periodically at home, if BP runs 140/90 or higher call office for follow up appointment. Follow up with provider in 3 months. Patient agreed.   I have reviewed and agree with above- J Copland MD

## 2018-01-03 NOTE — Telephone Encounter (Signed)
Patient would like to know if he needs to discontinue his Aspirin since he is 72 years old and per information he saw on TV is was advised at his age he need not take it. Please advise.

## 2018-01-04 NOTE — Telephone Encounter (Signed)
He should continue because he has some known coronary artery disease already. We continue it for secondary prevention just not primary prevention unless he has any recent bleeding

## 2018-01-04 NOTE — Telephone Encounter (Signed)
Patient notified, agreed. 

## 2018-01-13 DIAGNOSIS — G4733 Obstructive sleep apnea (adult) (pediatric): Secondary | ICD-10-CM | POA: Diagnosis not present

## 2018-01-18 ENCOUNTER — Telehealth: Payer: Self-pay | Admitting: Pulmonary Disease

## 2018-01-18 DIAGNOSIS — I4589 Other specified conduction disorders: Secondary | ICD-10-CM

## 2018-01-18 NOTE — Telephone Encounter (Signed)
Called and spoke with regarding referral for cardiology for pace maker as discussed in last ov. Do not see a referral at this time.  RA please advise

## 2018-01-18 NOTE — Telephone Encounter (Signed)
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

## 2018-01-18 NOTE — Telephone Encounter (Signed)
Spoke with pt, aware of recs.  Pt wants to know the name of the specific doctor that RA is referring him to.  I explained that we place referrals with a diagnosis and the cardiology office would put him with the doctor to best fit his DX, but pt was not satisfied with this.  Wants to know the name of the doctor prior to referral.  RA please advise- pt states that a specific doctor was named in his conversation with you.  Thanks.

## 2018-01-19 DIAGNOSIS — L2082 Flexural eczema: Secondary | ICD-10-CM | POA: Diagnosis not present

## 2018-01-19 NOTE — Telephone Encounter (Signed)
Would be Dr. Caryl Comes, Dr. Lovena Le or Dr. Rayann Heman

## 2018-01-19 NOTE — Telephone Encounter (Signed)
Spoke with pt and advised him of RA recommendations. Pt understood and referral placed. Nothing further is needed.

## 2018-02-09 ENCOUNTER — Institutional Professional Consult (permissible substitution): Payer: Medicare HMO | Admitting: Internal Medicine

## 2018-02-13 DIAGNOSIS — G4733 Obstructive sleep apnea (adult) (pediatric): Secondary | ICD-10-CM | POA: Diagnosis not present

## 2018-02-26 DIAGNOSIS — D1801 Hemangioma of skin and subcutaneous tissue: Secondary | ICD-10-CM | POA: Diagnosis not present

## 2018-02-26 DIAGNOSIS — L821 Other seborrheic keratosis: Secondary | ICD-10-CM | POA: Diagnosis not present

## 2018-02-26 DIAGNOSIS — D485 Neoplasm of uncertain behavior of skin: Secondary | ICD-10-CM | POA: Diagnosis not present

## 2018-02-26 DIAGNOSIS — R69 Illness, unspecified: Secondary | ICD-10-CM | POA: Diagnosis not present

## 2018-03-15 DIAGNOSIS — G4733 Obstructive sleep apnea (adult) (pediatric): Secondary | ICD-10-CM | POA: Diagnosis not present

## 2018-03-16 DIAGNOSIS — H0100B Unspecified blepharitis left eye, upper and lower eyelids: Secondary | ICD-10-CM | POA: Diagnosis not present

## 2018-03-16 DIAGNOSIS — H0100A Unspecified blepharitis right eye, upper and lower eyelids: Secondary | ICD-10-CM | POA: Diagnosis not present

## 2018-03-16 DIAGNOSIS — H0288B Meibomian gland dysfunction left eye, upper and lower eyelids: Secondary | ICD-10-CM | POA: Diagnosis not present

## 2018-03-16 DIAGNOSIS — H0288A Meibomian gland dysfunction right eye, upper and lower eyelids: Secondary | ICD-10-CM | POA: Diagnosis not present

## 2018-03-16 DIAGNOSIS — H02052 Trichiasis without entropian right lower eyelid: Secondary | ICD-10-CM | POA: Diagnosis not present

## 2018-03-23 DIAGNOSIS — G4733 Obstructive sleep apnea (adult) (pediatric): Secondary | ICD-10-CM | POA: Diagnosis not present

## 2018-04-17 ENCOUNTER — Encounter: Payer: Self-pay | Admitting: *Deleted

## 2018-04-17 ENCOUNTER — Encounter

## 2018-04-17 ENCOUNTER — Ambulatory Visit: Payer: Medicare HMO | Admitting: Internal Medicine

## 2018-04-17 ENCOUNTER — Encounter: Payer: Self-pay | Admitting: Internal Medicine

## 2018-04-17 VITALS — BP 124/60 | HR 57 | Ht 69.0 in | Wt 199.0 lb

## 2018-04-17 DIAGNOSIS — R0602 Shortness of breath: Secondary | ICD-10-CM

## 2018-04-17 DIAGNOSIS — Z01812 Encounter for preprocedural laboratory examination: Secondary | ICD-10-CM

## 2018-04-17 NOTE — Patient Instructions (Addendum)
Your physician has recommended that you have a pacemaker inserted. A pacemaker is a small device that is placed under the skin of your chest or abdomen to help control abnormal heart rhythms. This device uses electrical pulses to prompt the heart to beat at a normal rate. Pacemakers are used to treat heart rhythms that are too slow. Wire (leads) are attached to the pacemaker that goes into the chambers of you heart. This is done in the hospital and usually requires and overnight stay. Please see the instruction sheet given to you today for more information.  May 15, 2018  Your physician recommends that you schedule a follow-up appointment in: 10-14 days after procedure for wound check with device clinic.  Your physician recommends that you return for lab work within 2 weeks prior to procedure: BMET, CBC.

## 2018-04-17 NOTE — Progress Notes (Signed)
HPI Daniel Reyes is referred today by Dr. Elsworth Soho for evaluation of sinus node dysfunction. He has a h/o chronotropic insufficiency. While walking in the pulmonary office his HR increased to 60/min. He feels fatigued. He has some lung disease but this does not appear to be severe. He has never smoked. He has dyspnea with exertion. His thyroids are ok. He was noted to have some decrease in his DLCO. When he walks he does not desaturate. While sleeping his HR's go into the 40's. According to Dr. Bari Mantis note, at times his HR does increase with exercise and he dropped his oxygen sats.   Allergies  Allergen Reactions  . Imdur [Isosorbide Dinitrate] Anaphylaxis and Nausea And Vomiting    Nose swelling with difficulty breathing thru nose     Current Outpatient Medications  Medication Sig Dispense Refill  . albuterol (PROVENTIL HFA;VENTOLIN HFA) 108 (90 Base) MCG/ACT inhaler Inhale 2 puffs into the lungs every 6 (six) hours as needed for wheezing or shortness of breath. 1 Inhaler 2  . aspirin 81 MG EC tablet Take 1 tablet (81 mg total) by mouth daily. 90 tablet 3  . atorvastatin (LIPITOR) 20 MG tablet Take 1 tablet (20 mg total) by mouth 2 (two) times daily. 90 tablet 3  . fluticasone furoate-vilanterol (BREO ELLIPTA) 100-25 MCG/INH AEPB Inhale 1 puff into the lungs daily. 2 each 0   No current facility-administered medications for this visit.      Past Medical History:  Diagnosis Date  . History of chicken pox   . Hyperlipemia   . Mild CAD    a. cath 02/09/17 - Mild to moderate proximal and mid LAD plaque with less than 40% narrowing  . Nocturnal hypoxia     ROS:   All systems reviewed and negative except as noted in the HPI.   Past Surgical History:  Procedure Laterality Date  . BACK SURGERY  1980  . CHOLECYSTECTOMY OPEN  1998  . gerd surgery    . NECK SURGERY  1983  . RIGHT/LEFT HEART CATH AND CORONARY ANGIOGRAPHY N/A 02/09/2017   Procedure: RIGHT/LEFT HEART CATH AND CORONARY  ANGIOGRAPHY;  Surgeon: Belva Crome, MD;  Location: [facility];  Service: Cardiovascular;  Laterality: N/A;  . TONSILLECTOMY AND ADENOIDECTOMY       Family History  Problem Relation Age of Onset  . Heart attack Father   . Colon cancer Father   . Cancer Father   . Alzheimer's disease Mother   . Stroke Brother   . Heart disease Brother        afib  . Stroke Maternal Grandmother   . Mental illness Maternal Grandfather        suicide  . Alzheimer's disease Paternal Grandmother   . Peripheral vascular disease Paternal Grandfather   . Alcohol abuse Neg Hx      Social History   Socioeconomic History  . Marital status: Married    Spouse name: Not on file  . Number of children: 3  . Years of education: Not on file  . Highest education level: Not on file  Occupational History  . Occupation: retired-owner of [employer]  . Financial resource strain: Not on file  . Food insecurity:    Worry: Not on file    Inability: Not on file  . Transportation needs:    Medical: Not on file    Non-medical: Not on file  Tobacco Use  . Smoking status: Never  Smoker  . Smokeless tobacco: Never Used  Substance and Sexual Activity  . Alcohol use: Yes    Comment: very rarely  . Drug use: No  . Sexual activity: Not on file  Lifestyle  . Physical activity:    Days per week: Not on file    Minutes per session: Not on file  . Stress: Not on file  Relationships  . Social connections:    Talks on phone: Not on file    Gets together: Not on file    Attends religious service: Not on file    Active member of club or organization: Not on file    Attends meetings of clubs or organizations: Not on file    Relationship status: Not on file  . Intimate partner violence:    Fear of current or ex partner: Not on file    Emotionally abused: Not on file    Physically abused: Not on file    Forced sexual activity: Not on file  Other Topics Concern  . 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 Rare alcohol, wears seat belt most of the time     BP 124/60   Pulse (!) 57   Ht 5\' 9"  (1.753 m)   Wt 199 lb (90.3 kg)   BMI 29.39 kg/m   Physical Exam:  Well appearing NAD HEENT: Unremarkable Neck:  No JVD, no thyromegally Lymphatics:  No adenopathy Back:  No CVA tenderness Lungs:  Clear with no wheezes HEART:  Regular rate rhythm, no murmurs, no rubs, no clicks Abd:  soft, positive bowel sounds, no organomegally, no rebound, no guarding Ext:  2 plus pulses, no edema, no cyanosis, no clubbing Skin:  No rashes no nodules Neuro:  CN II through XII intact, motor grossly intact  EKG - sinus brady with RBBB   Assess/Plan: 1. 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 I suspect he may be having intermittent and rate related bundle branch block. With his inability to raise his heart rate above 60/min at times, in the setting of conduction system disease, I have recommended he undergo insertion of a PPM with minute ventilation. I have reviewed the indications/risks/benefits/goals/expectations of PPM insertion with the patient and he wishes to proceed. 2. RBBB/first degree AV block - unclear whether #1 is related to this or sinus node dysfunction.  3. OSA - he will continue his CPAP.  4. Dyspnea - I discussed very carefully with the patient and his wife that I think his dyspnea is multifactorial. I do think that a component is related to bradycardia based on the observation that he cannot always increase his HR.   Mikle Bosworth.D.

## 2018-04-17 NOTE — H&P (View-Only) (Signed)
HPI Daniel Reyes is referred today by Dr. Elsworth Soho for evaluation of sinus node dysfunction. He has a h/o chronotropic insufficiency. While walking in the pulmonary office his HR increased to 60/min. He feels fatigued. He has some lung disease but this does not appear to be severe. He has never smoked. He has dyspnea with exertion. His thyroids are ok. He was noted to have some decrease in his DLCO. When he walks he does not desaturate. While sleeping his HR's go into the 40's. According to Dr. Bari Mantis note, at times his HR does increase with exercise and he dropped his oxygen sats.   Allergies  Allergen Reactions  . Imdur [Isosorbide Dinitrate] Anaphylaxis and Nausea And Vomiting    Nose swelling with difficulty breathing thru nose     Current Outpatient Medications  Medication Sig Dispense Refill  . albuterol (PROVENTIL HFA;VENTOLIN HFA) 108 (90 Base) MCG/ACT inhaler Inhale 2 puffs into the lungs every 6 (six) hours as needed for wheezing or shortness of breath. 1 Inhaler 2  . aspirin 81 MG EC tablet Take 1 tablet (81 mg total) by mouth daily. 90 tablet 3  . atorvastatin (LIPITOR) 20 MG tablet Take 1 tablet (20 mg total) by mouth 2 (two) times daily. 90 tablet 3  . fluticasone furoate-vilanterol (BREO ELLIPTA) 100-25 MCG/INH AEPB Inhale 1 puff into the lungs daily. 2 each 0   No current facility-administered medications for this visit.      Past Medical History:  Diagnosis Date  . History of chicken pox   . Hyperlipemia   . Mild CAD    a. cath 02/09/17 - Mild to moderate proximal and mid LAD plaque with less than 40% narrowing  . Nocturnal hypoxia     ROS:   All systems reviewed and negative except as noted in the HPI.   Past Surgical History:  Procedure Laterality Date  . BACK SURGERY  1980  . CHOLECYSTECTOMY OPEN  1998  . gerd surgery    . NECK SURGERY  1983  . RIGHT/LEFT HEART CATH AND CORONARY ANGIOGRAPHY N/A 02/09/2017   Procedure: RIGHT/LEFT HEART CATH AND CORONARY  ANGIOGRAPHY;  Surgeon: Belva Crome, MD;  Location: [facility];  Service: Cardiovascular;  Laterality: N/A;  . TONSILLECTOMY AND ADENOIDECTOMY       Family History  Problem Relation Age of Onset  . Heart attack Father   . Colon cancer Father   . Cancer Father   . Alzheimer's disease Mother   . Stroke Brother   . Heart disease Brother        afib  . Stroke Maternal Grandmother   . Mental illness Maternal Grandfather        suicide  . Alzheimer's disease Paternal Grandmother   . Peripheral vascular disease Paternal Grandfather   . Alcohol abuse Neg Hx      Social History   Socioeconomic History  . Marital status: Married    Spouse name: Not on file  . Number of children: 3  . Years of education: Not on file  . Highest education level: Not on file  Occupational History  . Occupation: retired-owner of [employer]  . Financial resource strain: Not on file  . Food insecurity:    Worry: Not on file    Inability: Not on file  . Transportation needs:    Medical: Not on file    Non-medical: Not on file  Tobacco Use  . Smoking status: Never  Smoker  . Smokeless tobacco: Never Used  Substance and Sexual Activity  . Alcohol use: Yes    Comment: very rarely  . Drug use: No  . Sexual activity: Not on file  Lifestyle  . Physical activity:    Days per week: Not on file    Minutes per session: Not on file  . Stress: Not on file  Relationships  . Social connections:    Talks on phone: Not on file    Gets together: Not on file    Attends religious service: Not on file    Active member of club or organization: Not on file    Attends meetings of clubs or organizations: Not on file    Relationship status: Not on file  . Intimate partner violence:    Fear of current or ex partner: Not on file    Emotionally abused: Not on file    Physically abused: Not on file    Forced sexual activity: Not on file  Other Topics Concern  . 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 Rare alcohol, wears seat belt most of the time     BP 124/60   Pulse (!) 57   Ht 5\' 9"  (1.753 m)   Wt 199 lb (90.3 kg)   BMI 29.39 kg/m   Physical Exam:  Well appearing NAD HEENT: Unremarkable Neck:  No JVD, no thyromegally Lymphatics:  No adenopathy Back:  No CVA tenderness Lungs:  Clear with no wheezes HEART:  Regular rate rhythm, no murmurs, no rubs, no clicks Abd:  soft, positive bowel sounds, no organomegally, no rebound, no guarding Ext:  2 plus pulses, no edema, no cyanosis, no clubbing Skin:  No rashes no nodules Neuro:  CN II through XII intact, motor grossly intact  EKG - sinus brady with RBBB   Assess/Plan: 1. 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 I suspect he may be having intermittent and rate related bundle branch block. With his inability to raise his heart rate above 60/min at times, in the setting of conduction system disease, I have recommended he undergo insertion of a PPM with minute ventilation. I have reviewed the indications/risks/benefits/goals/expectations of PPM insertion with the patient and he wishes to proceed. 2. RBBB/first degree AV block - unclear whether #1 is related to this or sinus node dysfunction.  3. OSA - he will continue his CPAP.  4. Dyspnea - I discussed very carefully with the patient and his wife that I think his dyspnea is multifactorial. I do think that a component is related to bradycardia based on the observation that he cannot always increase his HR.   Mikle Bosworth.D.

## 2018-05-03 ENCOUNTER — Telehealth: Payer: Self-pay

## 2018-05-07 ENCOUNTER — Other Ambulatory Visit: Payer: Medicare HMO | Admitting: *Deleted

## 2018-05-07 DIAGNOSIS — R0602 Shortness of breath: Secondary | ICD-10-CM

## 2018-05-07 DIAGNOSIS — Z01812 Encounter for preprocedural laboratory examination: Secondary | ICD-10-CM | POA: Diagnosis not present

## 2018-05-07 LAB — CBC
HEMOGLOBIN: 14.2 g/dL (ref 13.0–17.7)
Hematocrit: 43.2 % (ref 37.5–51.0)
MCH: 28.1 pg (ref 26.6–33.0)
MCHC: 32.9 g/dL (ref 31.5–35.7)
MCV: 86 fL (ref 79–97)
Platelets: 228 10*3/uL (ref 150–450)
RBC: 5.05 x10E6/uL (ref 4.14–5.80)
RDW: 13.4 % (ref 12.3–15.4)
WBC: 6.9 10*3/uL (ref 3.4–10.8)

## 2018-05-07 LAB — BASIC METABOLIC PANEL
BUN/Creatinine Ratio: 18 (ref 10–24)
BUN: 19 mg/dL (ref 8–27)
CO2: 21 mmol/L (ref 20–29)
CREATININE: 1.03 mg/dL (ref 0.76–1.27)
Calcium: 9.5 mg/dL (ref 8.6–10.2)
Chloride: 105 mmol/L (ref 96–106)
GFR calc Af Amer: 84 mL/min/{1.73_m2} (ref >59)
GFR calc non Af Amer: 73 mL/min/{1.73_m2} (ref >59)
GLUCOSE: 94 mg/dL (ref 65–99)
Potassium: 4.1 mmol/L (ref 3.5–5.2)
SODIUM: 142 mmol/L (ref 134–144)

## 2018-05-07 NOTE — Telephone Encounter (Signed)
Confirmed arrival time change for procedure.  No further action needed.

## 2018-05-15 ENCOUNTER — Ambulatory Visit (HOSPITAL_COMMUNITY)
Admission: RE | Admit: 2018-05-15 | Discharge: 2018-05-16 | Disposition: A | Discharge status: Home / Self Care | Payer: Medicare HMO | Source: Ambulatory Visit | Attending: Internal Medicine | Admitting: Internal Medicine

## 2018-05-15 ENCOUNTER — Other Ambulatory Visit: Payer: Self-pay

## 2018-05-15 ENCOUNTER — Encounter (HOSPITAL_COMMUNITY)
Admission: RE | Disposition: A | Discharge status: Home / Self Care | Payer: Self-pay | Source: Ambulatory Visit | Attending: Internal Medicine

## 2018-05-15 ENCOUNTER — Encounter (HOSPITAL_COMMUNITY): Payer: Self-pay | Admitting: General Practice

## 2018-05-15 DIAGNOSIS — I495 Sick sinus syndrome: Secondary | ICD-10-CM | POA: Diagnosis not present

## 2018-05-15 DIAGNOSIS — Z7951 Long term (current) use of inhaled steroids: Secondary | ICD-10-CM | POA: Diagnosis not present

## 2018-05-15 DIAGNOSIS — G4733 Obstructive sleep apnea (adult) (pediatric): Secondary | ICD-10-CM | POA: Insufficient documentation

## 2018-05-15 DIAGNOSIS — Z7982 Long term (current) use of aspirin: Secondary | ICD-10-CM | POA: Insufficient documentation

## 2018-05-15 DIAGNOSIS — Z9049 Acquired absence of other specified parts of digestive tract: Secondary | ICD-10-CM | POA: Diagnosis not present

## 2018-05-15 DIAGNOSIS — R69 Illness, unspecified: Secondary | ICD-10-CM | POA: Diagnosis not present

## 2018-05-15 DIAGNOSIS — K219 Gastro-esophageal reflux disease without esophagitis: Secondary | ICD-10-CM | POA: Diagnosis not present

## 2018-05-15 DIAGNOSIS — Z888 Allergy status to other drugs, medicaments and biological substances status: Secondary | ICD-10-CM | POA: Diagnosis not present

## 2018-05-15 DIAGNOSIS — E785 Hyperlipidemia, unspecified: Secondary | ICD-10-CM | POA: Diagnosis not present

## 2018-05-15 DIAGNOSIS — Z79899 Other long term (current) drug therapy: Secondary | ICD-10-CM | POA: Insufficient documentation

## 2018-05-15 DIAGNOSIS — I4510 Unspecified right bundle-branch block: Secondary | ICD-10-CM | POA: Insufficient documentation

## 2018-05-15 DIAGNOSIS — Z950 Presence of cardiac pacemaker: Secondary | ICD-10-CM

## 2018-05-15 DIAGNOSIS — Z823 Family history of stroke: Secondary | ICD-10-CM | POA: Diagnosis not present

## 2018-05-15 DIAGNOSIS — Z955 Presence of coronary angioplasty implant and graft: Secondary | ICD-10-CM | POA: Insufficient documentation

## 2018-05-15 DIAGNOSIS — Z9889 Other specified postprocedural states: Secondary | ICD-10-CM | POA: Insufficient documentation

## 2018-05-15 DIAGNOSIS — F17210 Nicotine dependence, cigarettes, uncomplicated: Secondary | ICD-10-CM | POA: Insufficient documentation

## 2018-05-15 DIAGNOSIS — Z8249 Family history of ischemic heart disease and other diseases of the circulatory system: Secondary | ICD-10-CM | POA: Diagnosis not present

## 2018-05-15 DIAGNOSIS — R0609 Other forms of dyspnea: Secondary | ICD-10-CM | POA: Insufficient documentation

## 2018-05-15 DIAGNOSIS — I2510 Atherosclerotic heart disease of native coronary artery without angina pectoris: Secondary | ICD-10-CM | POA: Diagnosis not present

## 2018-05-15 HISTORY — PX: PACEMAKER IMPLANT: EP1218

## 2018-05-15 LAB — SURGICAL PCR SCREEN
MRSA, PCR: NEGATIVE
Staphylococcus aureus: NEGATIVE

## 2018-05-15 SURGERY — PACEMAKER IMPLANT

## 2018-05-15 MED ORDER — SODIUM CHLORIDE 0.9 % IV SOLN
80.0000 mg | INTRAVENOUS | Status: AC
  Administered 2018-05-15: 80 mg

## 2018-05-15 MED ORDER — SODIUM CHLORIDE 0.9 % IV SOLN
INTRAVENOUS | Status: AC
  Filled 2018-05-15: qty 2

## 2018-05-15 MED ORDER — CEFAZOLIN SODIUM-DEXTROSE 1-4 GM/50ML-% IV SOLN
1.0000 g | Freq: Four times a day (QID) | INTRAVENOUS | Status: DC
  Administered 2018-05-15 – 2018-05-16 (×2): 1 g via INTRAVENOUS
  Filled 2018-05-15 (×3): qty 50

## 2018-05-15 MED ORDER — ALBUTEROL SULFATE (2.5 MG/3ML) 0.083% IN NEBU: 2.5000 mg | INHALATION_SOLUTION | Freq: Four times a day (QID) | RESPIRATORY_TRACT | Status: DC | PRN

## 2018-05-15 MED ORDER — CHLORHEXIDINE GLUCONATE 4 % EX LIQD
60.0000 mL | Freq: Once | CUTANEOUS | Status: DC
  Filled 2018-05-15: qty 60

## 2018-05-15 MED ORDER — MIDAZOLAM HCL 5 MG/5ML IJ SOLN
INTRAMUSCULAR | Status: AC
  Filled 2018-05-15: qty 5

## 2018-05-15 MED ORDER — CEFAZOLIN SODIUM-DEXTROSE 2-4 GM/100ML-% IV SOLN
INTRAVENOUS | Status: AC
  Filled 2018-05-15: qty 100

## 2018-05-15 MED ORDER — HEPARIN (PORCINE) IN NACL 1000-0.9 UT/500ML-% IV SOLN
INTRAVENOUS | Status: DC | PRN
  Administered 2018-05-15: 500 mL

## 2018-05-15 MED ORDER — MIDAZOLAM HCL 5 MG/5ML IJ SOLN
INTRAMUSCULAR | Status: DC | PRN
  Administered 2018-05-15: 1 mg via INTRAVENOUS
  Administered 2018-05-15: 2 mg via INTRAVENOUS
  Administered 2018-05-15 (×2): 1 mg via INTRAVENOUS

## 2018-05-15 MED ORDER — FENTANYL CITRATE (PF) 100 MCG/2ML IJ SOLN
INTRAMUSCULAR | Status: AC
  Filled 2018-05-15: qty 2

## 2018-05-15 MED ORDER — CEFAZOLIN SODIUM-DEXTROSE 2-4 GM/100ML-% IV SOLN
2.0000 g | INTRAVENOUS | Status: AC
  Administered 2018-05-15: 2 g via INTRAVENOUS
  Filled 2018-05-15: qty 100

## 2018-05-15 MED ORDER — FLUTICASONE FUROATE-VILANTEROL 100-25 MCG/INH IN AEPB
1.0000 | INHALATION_SPRAY | Freq: Every day | RESPIRATORY_TRACT | Status: DC
  Filled 2018-05-15: qty 28

## 2018-05-15 MED ORDER — ADULT MULTIVITAMIN W/MINERALS CH
1.0000 | ORAL_TABLET | Freq: Every day | ORAL | Status: DC
  Administered 2018-05-16: 1 via ORAL
  Filled 2018-05-15: qty 1

## 2018-05-15 MED ORDER — ONDANSETRON HCL 4 MG/2ML IJ SOLN: 4.0000 mg | Freq: Four times a day (QID) | INTRAMUSCULAR | Status: DC | PRN

## 2018-05-15 MED ORDER — ACETAMINOPHEN 325 MG PO TABS
325.0000 mg | ORAL_TABLET | ORAL | Status: DC | PRN
  Administered 2018-05-15 – 2018-05-16 (×2): 650 mg via ORAL
  Filled 2018-05-15 (×2): qty 2

## 2018-05-15 MED ORDER — SODIUM CHLORIDE 0.9 % IV SOLN
INTRAVENOUS | Status: DC
  Administered 2018-05-15: 10:00:00 via INTRAVENOUS

## 2018-05-15 MED ORDER — MUPIROCIN 2 % EX OINT
1.0000 "application " | TOPICAL_OINTMENT | Freq: Once | CUTANEOUS | Status: AC
  Administered 2018-05-15: 1 via TOPICAL
  Filled 2018-05-15: qty 22

## 2018-05-15 MED ORDER — MUPIROCIN 2 % EX OINT
TOPICAL_OINTMENT | CUTANEOUS | Status: AC
  Filled 2018-05-15: qty 22

## 2018-05-15 MED ORDER — LIDOCAINE HCL (PF) 1 % IJ SOLN
INTRAMUSCULAR | Status: DC | PRN
  Administered 2018-05-15: 45 mL

## 2018-05-15 MED ORDER — FENTANYL CITRATE (PF) 100 MCG/2ML IJ SOLN
INTRAMUSCULAR | Status: DC | PRN
  Administered 2018-05-15 (×3): 25 ug via INTRAVENOUS

## 2018-05-15 SURGICAL SUPPLY — 9 items
CABLE SURGICAL S-101-97-12 (CABLE) ×2 IMPLANT
INGEVITY MRI 7740-45CM (Lead) ×2 IMPLANT
INGEVITY MRI 7741-52CM (Lead) ×2 IMPLANT
LEAD PACING INGEVITY MRI 45CM (Lead) ×1 IMPLANT
LEAD PACING INGEVITY MRI 52CM (Lead) ×1 IMPLANT
PACEMAKER ACCOLADE DR-EL (Pacemaker) ×2 IMPLANT
PAD PRO RADIOLUCENT 2001M-C (PAD) ×2 IMPLANT
SHEATH CLASSIC 7F (SHEATH) ×4 IMPLANT
TRAY PACEMAKER INSERTION (PACKS) ×2 IMPLANT

## 2018-05-15 NOTE — Interval H&P Note (Signed)
History and Physical Interval Note:  05/15/2018 12:53 PM  Daniel Reyes  has presented today for surgery, with the diagnosis of hb  The various methods of treatment have been discussed with the patient and family. After consideration of risks, benefits and other options for treatment, the patient has consented to  Procedure(s): PACEMAKER IMPLANT - daul chamber (N/A) as a surgical intervention .  The patient's history has been reviewed, patient examined, no change in status, stable for surgery.  I have reviewed the patient's chart and labs.  Questions were answered to the patient's satisfaction.     Daniel Reyes

## 2018-05-15 NOTE — Progress Notes (Signed)
Received post pacemaker placement.Surgical site dressing CDI, no hematoma or bruising noted.

## 2018-05-16 ENCOUNTER — Encounter (HOSPITAL_COMMUNITY): Payer: Self-pay | Admitting: Internal Medicine

## 2018-05-16 ENCOUNTER — Ambulatory Visit (HOSPITAL_COMMUNITY): Payer: Medicare HMO

## 2018-05-16 DIAGNOSIS — R69 Illness, unspecified: Secondary | ICD-10-CM | POA: Diagnosis not present

## 2018-05-16 DIAGNOSIS — I495 Sick sinus syndrome: Secondary | ICD-10-CM | POA: Diagnosis not present

## 2018-05-16 DIAGNOSIS — I2510 Atherosclerotic heart disease of native coronary artery without angina pectoris: Secondary | ICD-10-CM | POA: Diagnosis not present

## 2018-05-16 DIAGNOSIS — Z950 Presence of cardiac pacemaker: Secondary | ICD-10-CM | POA: Diagnosis not present

## 2018-05-16 DIAGNOSIS — Z7982 Long term (current) use of aspirin: Secondary | ICD-10-CM | POA: Diagnosis not present

## 2018-05-16 DIAGNOSIS — R0609 Other forms of dyspnea: Secondary | ICD-10-CM | POA: Diagnosis not present

## 2018-05-16 DIAGNOSIS — E785 Hyperlipidemia, unspecified: Secondary | ICD-10-CM | POA: Diagnosis not present

## 2018-05-16 DIAGNOSIS — I4510 Unspecified right bundle-branch block: Secondary | ICD-10-CM | POA: Diagnosis not present

## 2018-05-16 DIAGNOSIS — G4733 Obstructive sleep apnea (adult) (pediatric): Secondary | ICD-10-CM | POA: Diagnosis not present

## 2018-05-16 DIAGNOSIS — K219 Gastro-esophageal reflux disease without esophagitis: Secondary | ICD-10-CM | POA: Diagnosis not present

## 2018-05-16 DIAGNOSIS — Z79899 Other long term (current) drug therapy: Secondary | ICD-10-CM | POA: Diagnosis not present

## 2018-05-16 IMAGING — DX DG CHEST 2V
2 series · 2 of 2 positions shown · non-contrast
Comparison: [DATE] chest radiographs and [DATE] chest CTA

CLINICAL DATA: Pacemaker placement.

EXAM:
CHEST - 2 VIEW

[w chest pa]
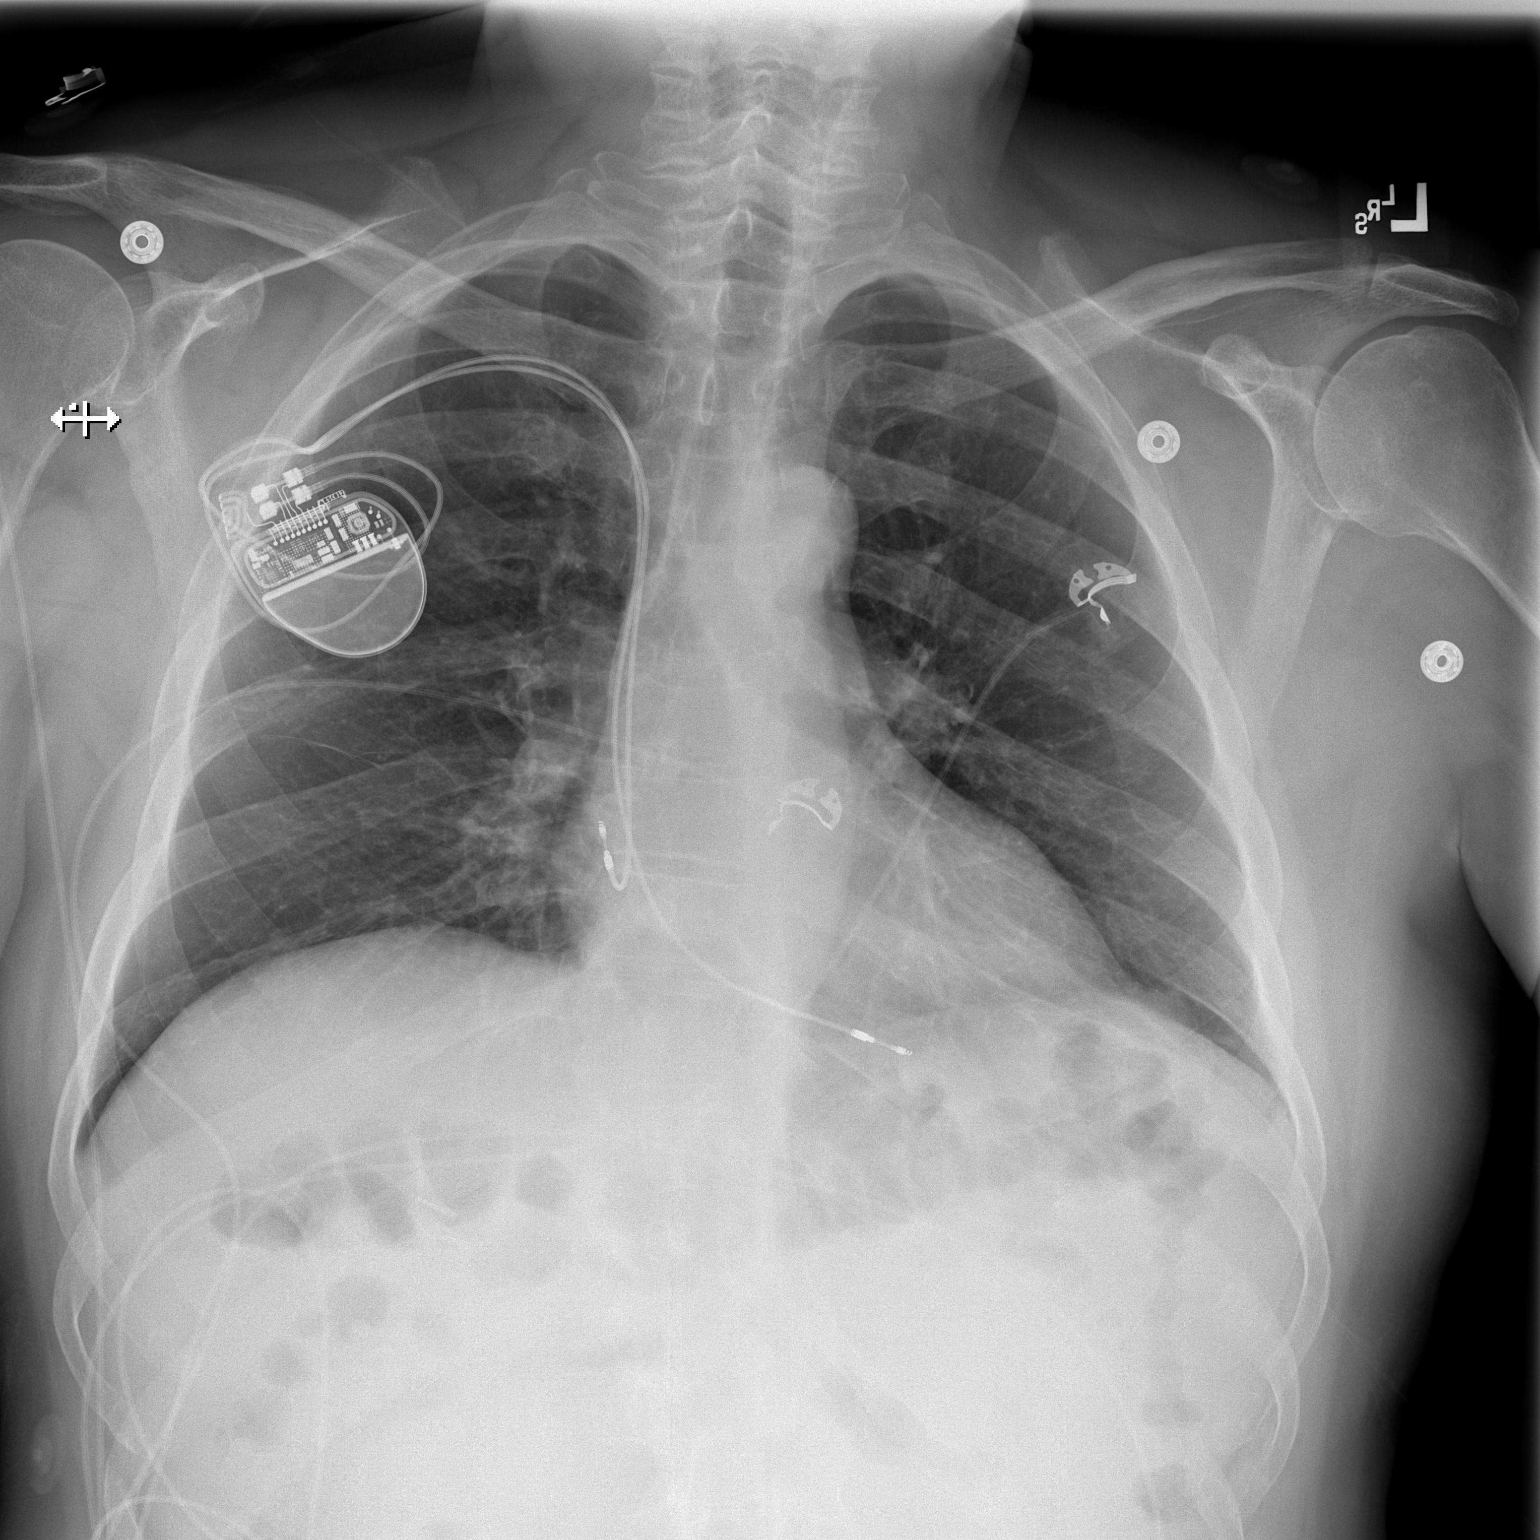

[w chest lat]
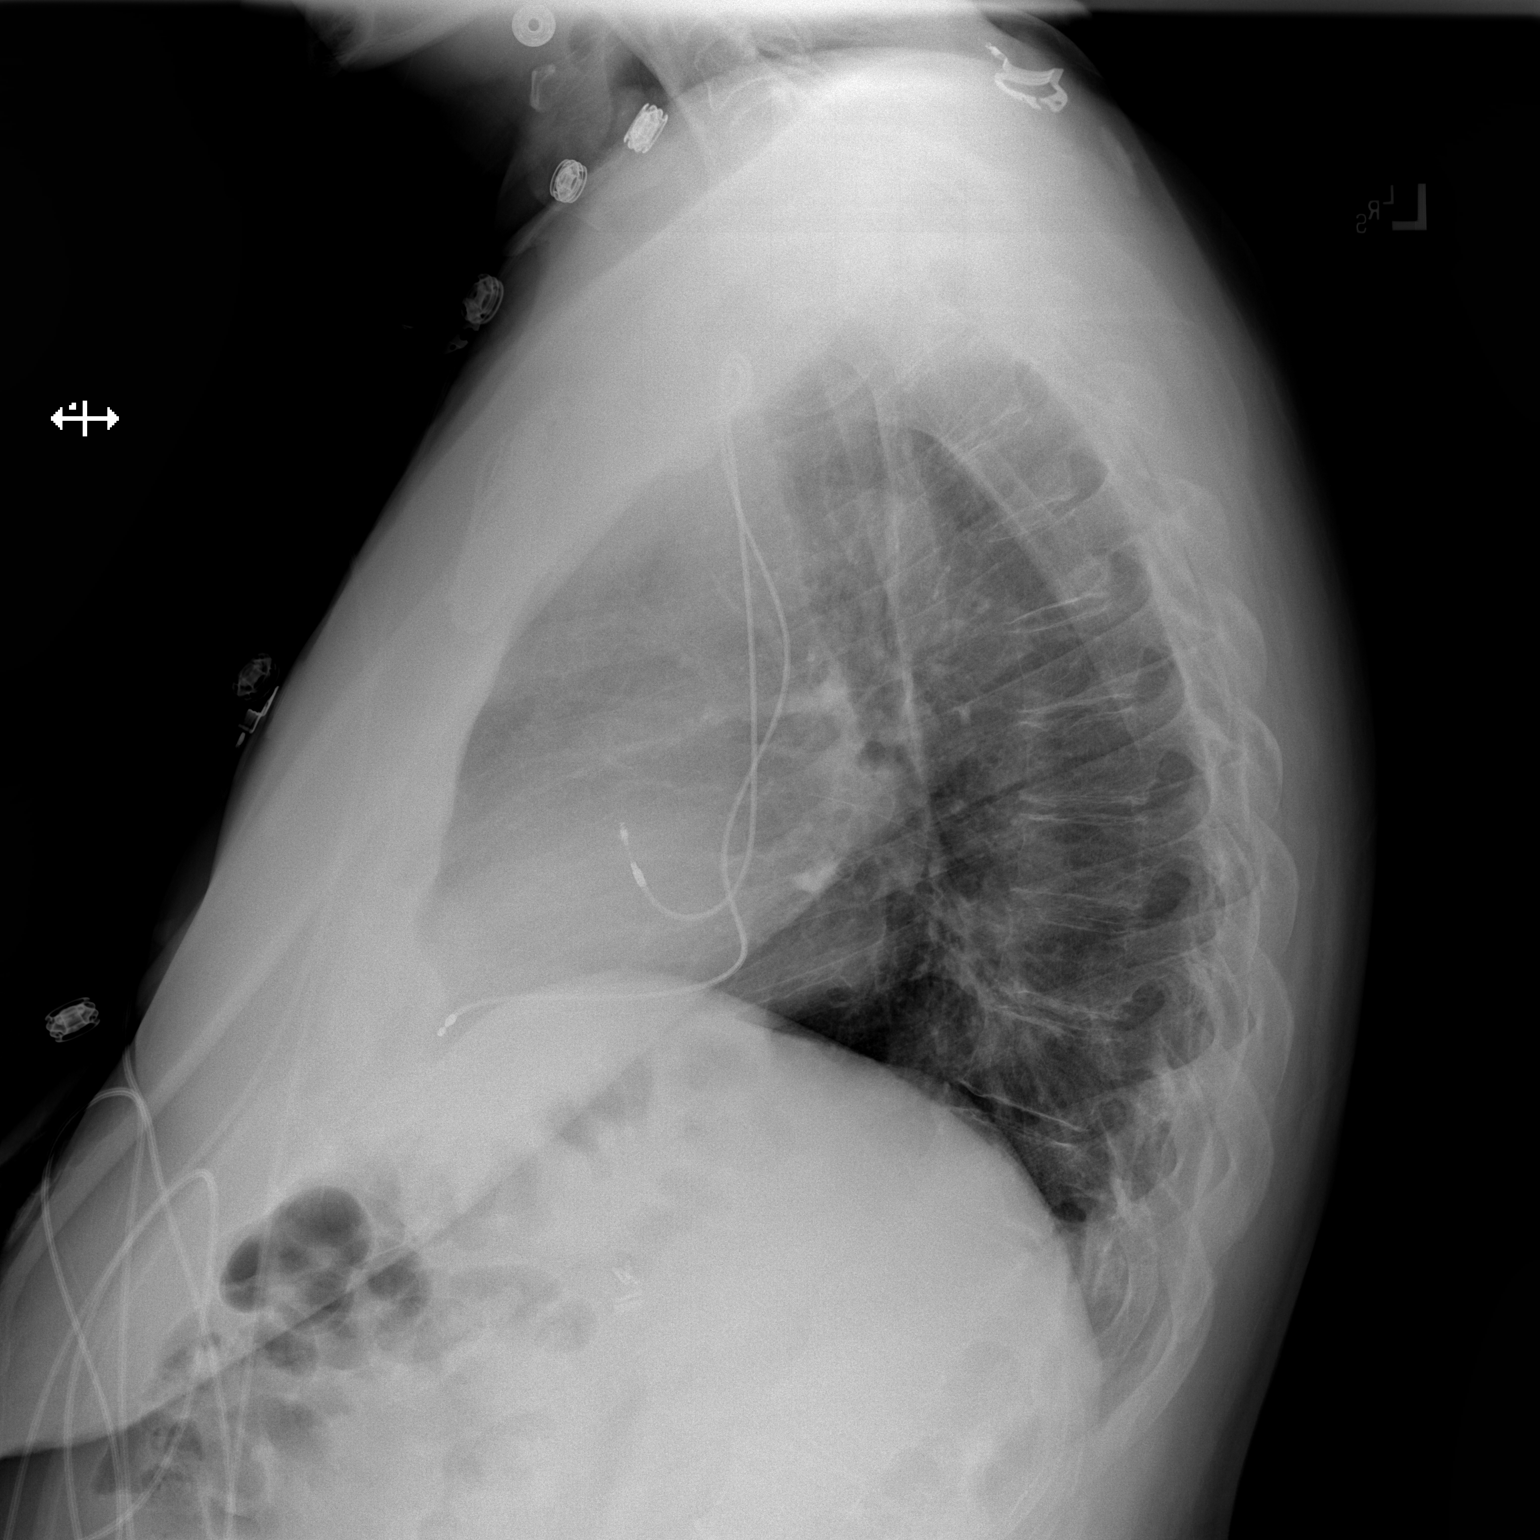

[2 of 2 positions shown; findings below may reference images not displayed]

FINDINGS: A right subclavian approach pacemaker has been placed with leads
terminating over the right atrium and right ventricle. The
cardiomediastinal silhouette is unchanged with normal heart size.
Lung volumes are mildly lower than on the prior radiographs. No
airspace consolidation, edema, pleural effusion, pneumothorax is
identified. Right upper quadrant abdominal surgical clips are noted.
No acute osseous abnormality is identified.
IMPRESSION: Interval pacemaker placement without evidence of pneumothorax or
active cardiopulmonary disease.

## 2018-05-16 NOTE — Discharge Summary (Addendum)
ELECTROPHYSIOLOGY PROCEDURE DISCHARGE SUMMARY    Patient ID: Daniel Reyes,  MRN: [id number], DOB/AGE: [birth year] 72 y.o.  Admit date: 05/15/2018 Discharge date: 05/16/2018  Primary Care Physician: Mosie Lukes, MD Electrophysiologist: Lovena Le  Primary Discharge Diagnosis:  Symptomatic sinus node dysfunction status post pacemaker implantation this admission  Secondary Discharge Diagnosis:  1.  OSA 2.  RBBB  Allergies  Allergen Reactions  . Imdur [Isosorbide Dinitrate] Anaphylaxis and Nausea And Vomiting    Nose swelling with difficulty breathing thru nose     Procedures This Admission:  1.  Implantation of a BSX dual chamber PPM on 05/15/18 by Dr Lovena Le.  See op note for full details.  There were no immediate post procedure complications. 2.  CXR on 05/16/18 demonstrated no pneumothorax status post device implantation.   Brief HPI: Daniel Reyes is a 72 y.o. male was referred to electrophysiology in the outpatient setting for consideration of PPM implantation.  Past medical history includes chronotropic incompetence, RBBB, OSA, shortness of breath.  The patient has had symptomatic bradycardia without reversible causes identified.  Risks, benefits, and alternatives to PPM implantation were reviewed with the patient who wished to proceed.   Hospital Course:  The patient was admitted and underwent implantation of a BSX dual chamber PPM with details as outlined above.  He  was monitored on telemetry overnight which demonstrated AP/VS, AP/VP.  Right chest was without hematoma or ecchymosis.  The device was interrogated and found to be functioning normally.  CXR was obtained and demonstrated no pneumothorax status post device implantation.  Wound care, arm mobility, and restrictions were reviewed with the patient.  The patient was examined and considered stable for discharge to home.    Physical Exam: Vitals:   05/15/18 1802 05/15/18 2017 05/16/18 0045 05/16/18 0333  BP: (!)  173/93 116/68 132/73 (!) 147/88  Pulse: 61 64 60 60  Resp:  20  16  Temp:  98.2 F (36.8 C) 97.7 F (36.5 C) 97.7 F (36.5 C)  TempSrc:  Oral Oral Oral  SpO2: 98% 96% 98% 97%  Weight:    88.3 kg  Height:        GEN- The patient is elderly appearing, alert and oriented x 3 today.   HEENT: normocephalic, atraumatic; sclera clear, conjunctiva pink; hearing intact; oropharynx clear; neck supple  Lungs- Clear to ausculation bilaterally, normal work of breathing.  No wheezes, rales, rhonchi Heart- Regular rate and rhythm  GI- soft, non-tender, non-distended, bowel sounds present  Extremities- no clubbing, cyanosis, or edema MS- no significant deformity or atrophy Skin- warm and dry, no rash or lesion, right chest without hematoma/ecchymosis Psych- euthymic mood, full affect Neuro- strength and sensation are intact   Labs:   Lab Results  Component Value Date   WBC 6.9 05/07/2018   HGB 14.2 05/07/2018   HCT 43.2 05/07/2018   MCV 86 05/07/2018   PLT 228 05/07/2018   No results for input(s): NA, K, CL, CO2, BUN, CREATININE, CALCIUM, PROT, BILITOT, ALKPHOS, ALT, AST, GLUCOSE in the last 168 hours.  Invalid input(s): LABALBU  Discharge Medications:  Allergies as of 05/16/2018      Reactions   Imdur [isosorbide Dinitrate] Anaphylaxis, Nausea And Vomiting   Nose swelling with difficulty breathing thru nose      Medication List    TAKE these medications   albuterol 108 (90 Base) MCG/ACT inhaler Commonly known as:  PROVENTIL HFA;VENTOLIN HFA Inhale 2 puffs into the lungs every 6 (six) hours as  needed for wheezing or shortness of breath.   aspirin 81 MG EC tablet Take 1 tablet (81 mg total) by mouth daily.   atorvastatin 20 MG tablet Commonly known as:  LIPITOR Take 1 tablet (20 mg total) by mouth 2 (two) times daily. What changed:  when to take this   fluticasone furoate-vilanterol 100-25 MCG/INH Aepb Commonly known as:  BREO ELLIPTA Inhale 1 puff into the lungs daily.     multivitamin with minerals Tabs tablet Take 1 tablet by mouth daily.       Disposition:  Discharge Instructions    Diet - low sodium heart healthy   Complete by:  As directed    Increase activity slowly   Complete by:  As directed      Follow-up Information    [facility] Follow up on 05/28/2018.   Specialty:  Cardiology Why:  at [facility] information: [address] [place]       Evans Lance, MD Follow up on 08/29/2018.   Specialty:  Cardiology Why:  at 9:45AM Contact information: [address] [address] [phone]           Duration of Discharge Encounter: Greater than 30 minutes including physician time.  Signed, Chanetta Marshall, NP 05/16/2018 7:08 AM   EP Attending  Patient seen and examined. PPM insertion under my supervision demonstrates normal device function. CXR looks good. He will followup as per usual. His incision looks good.  Mikle Bosworth.D.

## 2018-05-16 NOTE — Progress Notes (Signed)
Orthopedic Tech Progress Note Patient Details:  Daniel Reyes [birth year] [id number]  Patient ID: [name], male   DOB: 09-08-1945, 72 y.o.   MRN: [id number] Pt already has sling in room  Karolee Stamps 05/16/2018, 9:50 AM

## 2018-05-16 NOTE — Progress Notes (Signed)
Discharge instructions given. Pt verbalized understanding and all questions were answered.  

## 2018-05-16 NOTE — Discharge Instructions (Signed)
° ° °  Supplemental Discharge Instructions for  °Pacemaker/Defibrillator Patients ° °Activity °No heavy lifting or vigorous activity with your left/right arm for 6 to 8 weeks.  Do not raise your left/right arm above your head for one week.  Gradually raise your affected arm as drawn below. ° °        ° °__         05/20/18                 05/21/18                  05/22/18                 05/23/18 ° °NO DRIVING for 1 week    ; you may begin driving on   05/23/18  . ° °WOUND CARE °- Keep the wound area clean and dry.  Do not get this area wet for one week. No showers for one week; you may shower on  05/23/18   . °- The tape/steri-strips on your wound will fall off; do not pull them off.  No bandage is needed on the site.  DO  NOT apply any creams, oils, or ointments to the wound area. °- If you notice any drainage or discharge from the wound, any swelling or bruising at the site, or you develop a fever > 101? F after you are discharged home, call the office at once. ° °Special Instructions °- You are still able to use cellular telephones; use the ear opposite the side where you have your pacemaker/defibrillator.  Avoid carrying your cellular phone near your device. °- When traveling through airports, show security personnel your identification card to avoid being screened in the metal detectors.  Ask the security personnel to use the hand wand. °- Avoid arc welding equipment, MRI testing (magnetic resonance imaging), TENS units (transcutaneous nerve stimulators).  Call the office for questions about other devices. °- Avoid electrical appliances that are in poor condition or are not properly grounded. °- Microwave ovens are safe to be near or to operate. °  °

## 2018-05-28 ENCOUNTER — Ambulatory Visit (INDEPENDENT_AMBULATORY_CARE_PROVIDER_SITE_OTHER): Payer: Medicare HMO | Admitting: *Deleted

## 2018-05-28 DIAGNOSIS — I495 Sick sinus syndrome: Secondary | ICD-10-CM | POA: Diagnosis not present

## 2018-05-30 LAB — CUP PACEART INCLINIC DEVICE CHECK
Date Time Interrogation Session: 20191216050000
Implantable Lead Implant Date: 20191203
Implantable Lead Implant Date: 20191203
Implantable Lead Location: 753859
Implantable Lead Location: 753860
Implantable Lead Model: 7741
Implantable Lead Serial Number: 711800
Implantable Pulse Generator Implant Date: 20191203
Lead Channel Impedance Value: 636 Ohm
Lead Channel Impedance Value: 718 Ohm
Lead Channel Pacing Threshold Amplitude: 0.6 V
Lead Channel Pacing Threshold Amplitude: 0.9 V
Lead Channel Pacing Threshold Pulse Width: 0.4 ms
Lead Channel Pacing Threshold Pulse Width: 0.4 ms
Lead Channel Sensing Intrinsic Amplitude: 2.3 mV
Lead Channel Sensing Intrinsic Amplitude: 6 mV
Lead Channel Setting Pacing Amplitude: 3.5 V
Lead Channel Setting Pacing Pulse Width: 0.4 ms
Lead Channel Setting Sensing Sensitivity: 2.5 mV
MDC IDC LEAD SERIAL: 1095486
MDC IDC SET LEADCHNL RA PACING AMPLITUDE: 3.5 V
Pulse Gen Serial Number: 838922

## 2018-05-30 NOTE — Progress Notes (Signed)
Wound check appointment. Steri-strips removed. Wound without redness or edema. Incision edges approximated, wound well healed. Normal device function. Thresholds, sensing, and impedances consistent with implant measurements. Device programmed at 3.5V for extra safety margin until 3 month visit. Histogram distribution appropriate for patient and level of activity. No mode switches or high ventricular rates noted. Patient educated about wound care, arm mobility, lifting restrictions. ROV in 3 months with GT. 

## 2018-05-31 NOTE — Progress Notes (Addendum)
Subjective:   Daniel Reyes is a 72 y.o. male who presents for Medicare Annual/Subsequent preventive examination.  Pt enjoys fly fishing.  Review of Systems: No ROS.  Medicare Wellness Visit. Additional risk factors are reflected in the social history. Cardiac Risk Factors include: advanced age (>46men, >3 women);male gender;dyslipidemia Sleep patterns: wakes to urinate but sleeps well overall  Home Safety/Smoke Alarms: Feels safe in home. Smoke alarms in place.  Lives with wife in 2 story home.  Eye- pt states he will schedule after Christmas. Wearing glasses.   Male:   CCS-  Father had colon cancer. Reports 3 yrs ago. Pt reports he does evey 5 yrs in [place]. Records requested.   PSA-  Lab Results  Component Value Date   PSA 0.27 11/30/2017   PSA 0.43 01/05/2017       Objective:    Vitals: BP 126/68 (BP Location: Left Arm, Patient Position: Sitting, Cuff Size: Normal) Comment: all vitals done by Magdalene Molly CMA  Pulse 64   Ht 5\' 9"  (1.753 m)   Wt 200 lb (90.7 kg)   SpO2 98%   BMI 29.53 kg/m   Body mass index is 29.53 kg/m.  Advanced Directives 06/01/2018 05/15/2018 02/09/2017 02/08/2017  Does Patient Have a Medical Advance Directive? Yes Yes No No  Type of Paramedic of [place];Living will [place];Living will - -  Does patient want to make changes to medical advance directive? No - Patient declined No - Patient declined - -  Copy of [place] in Chart? No - copy requested No - copy requested - -  Would patient like information on creating a medical advance directive? - - No - Patient declined -    Tobacco Social History   Tobacco Use  Smoking Status Never Smoker  Smokeless Tobacco Never Used     Counseling given: Not Answered   Clinical Intake:     Pain : No/denies pain                 Past Medical History:  Diagnosis Date  . History of chicken pox   . Hyperlipemia   . Mild  CAD    a. cath 02/09/17 - Mild to moderate proximal and mid LAD plaque with less than 40% narrowing  . Nocturnal hypoxia    Past Surgical History:  Procedure Laterality Date  . BACK SURGERY  1980  . CHOLECYSTECTOMY OPEN  1998  . gerd surgery    . NECK SURGERY  1983  . PACEMAKER IMPLANT N/A 05/15/2018   Procedure: PACEMAKER IMPLANT - daul chamber;  Surgeon: Evans Lance, MD;  Location: [facility];  Service: Cardiovascular;  Laterality: N/A;  . RIGHT/LEFT HEART CATH AND CORONARY ANGIOGRAPHY N/A 02/09/2017   Procedure: RIGHT/LEFT HEART CATH AND CORONARY ANGIOGRAPHY;  Surgeon: Belva Crome, MD;  Location: [facility];  Service: Cardiovascular;  Laterality: N/A;  . TONSILLECTOMY AND ADENOIDECTOMY     Family History  Problem Relation Age of Onset  . Heart attack Father   . Colon cancer Father   . Cancer Father   . Alzheimer's disease Mother   . Stroke Brother   . Heart disease Brother        afib  . Stroke Maternal Grandmother   . Mental illness Maternal Grandfather        suicide  . Alzheimer's disease Paternal Grandmother   . Peripheral vascular disease Paternal Grandfather   . Alcohol abuse  Neg Hx    Social History   Socioeconomic History  . Marital status: Married    Spouse name: Not on file  . Number of children: 3  . Years of education: Not on file  . Highest education level: Not on file  Occupational History  . Occupation: retired-owner of [employer]  . Financial resource strain: Not on file  . Food insecurity:    Worry: Not on file    Inability: Not on file  . Transportation needs:    Medical: Not on file    Non-medical: Not on file  Tobacco Use  . Smoking status: Never Smoker  . Smokeless tobacco: Never Used  Substance and Sexual Activity  . Alcohol use: Yes    Comment: very rarely  . Drug use: No  . Sexual activity: Not Currently  Lifestyle  . Physical activity:    Days per week: Not on file    Minutes  per session: Not on file  . Stress: Not on file  Relationships  . Social connections:    Talks on phone: Not on file    Gets together: Not on file    Attends religious service: Not on file    Active member of club or organization: Not on file    Attends meetings of clubs or organizations: Not on file    Relationship status: Not on file  Other Topics Concern  . Not on file  Social History Narrative   Lives with wife, retired Pharmacist, community, no major dietary restrictions, negative cigarettes. Rare alcohol, wears seat belt most of the time    Outpatient Encounter Medications as of 06/01/2018  Medication Sig  . albuterol (PROVENTIL HFA;VENTOLIN HFA) 108 (90 Base) MCG/ACT inhaler Inhale 2 puffs into the lungs every 6 (six) hours as needed for wheezing or shortness of breath.  Marland Kitchen aspirin 81 MG EC tablet Take 1 tablet (81 mg total) by mouth daily.  Marland Kitchen atorvastatin (LIPITOR) 20 MG tablet Take 1 tablet (20 mg total) by mouth 2 (two) times daily. (Patient taking differently: Take 20 mg by mouth daily. )  . fluticasone furoate-vilanterol (BREO ELLIPTA) 100-25 MCG/INH AEPB Inhale 1 puff into the lungs daily.  . Multiple Vitamin (MULTIVITAMIN WITH MINERALS) TABS tablet Take 1 tablet by mouth daily.   No facility-administered encounter medications on file as of 06/01/2018.     Activities of Daily Living In your present state of health, do you have any difficulty performing the following activities: 06/01/2018 05/15/2018  Hearing? N N  Vision? N N  Difficulty concentrating or making decisions? N N  Comment pt states he is very active in [employer] -  Walking or climbing stairs? N N  Dressing or bathing? N N  Doing errands, shopping? N N  Preparing Food and eating ? N -  Using the Toilet? N -  In the past six months, have you accidently leaked urine? N -  Do you have problems with loss of bowel control? N -  Managing your Medications? N -  Managing your Finances? N -  Housekeeping or  managing your Housekeeping? N -  Some recent data might be hidden    Patient Care Team: Mosie Lukes, MD as PCP - General (Family Medicine)   Assessment:   This is a routine wellness examination for [place]. Physical assessment deferred to PCP.  Exercise Activities and Dietary recommendations Current Exercise Habits: The patient does not participate in regular exercise at present, Exercise limited by:  None identified Diet (meal preparation, eat out, water intake, caffeinated beverages, dairy products, fruits and vegetables): in general, a "healthy" diet  , well balanced  Pt reports he needs to drink more water.   Goals    . Downsize  home.    . Increase physical activity     Begin silver sneakers        Fall Risk Fall Risk  06/01/2018 05/29/2017 06/01/2015  Falls in the past year? 0 No No   Depression Screen PHQ 2/9 Scores 06/01/2018 05/29/2017 06/01/2015  PHQ - 2 Score 0 0 0    Cognitive Function Ad8 score reviewed for issues:  Issues making decisions:no  Less interest in hobbies / activities:no  Repeats questions, stories (family complaining):no  Trouble using ordinary gadgets (microwave, computer, phone):no  Forgets the month or year: no  Mismanaging finances: no  Remembering appts:no  Daily problems with thinking and/or memory:no Ad8 score is=0        Immunization History  Administered Date(s) Administered  . Influenza, High Dose Seasonal PF 04/24/2017, 02/26/2018  . Influenza,inj,Quad PF,6+ Mos 02/14/2013  . Influenza-Unspecified 05/11/2015  . Pneumococcal Conjugate-13 06/01/2015  . Pneumococcal Polysaccharide-23 02/14/2013  . Td 05/29/2017  . Zoster Recombinat (Shingrix) 02/22/2018    Screening Tests Health Maintenance  Topic Date Due  . Hepatitis C Screening  1946/06/06  . INFLUENZA VACCINE  01/11/2018  . COLONOSCOPY  01/11/2025  . TETANUS/TDAP  05/30/2027  . PNA vac Low Risk Adult  Completed      Plan:    Please schedule your next  medicare wellness visit with me in 1 yr.  Continue to eat heart healthy diet (full of fruits, vegetables, whole grains, lean protein, water--limit salt, fat, and sugar intake) and increase physical activity as tolerated.  Continue doing brain stimulating activities (puzzles, reading, adult coloring books, staying active) to keep memory sharp.     I have personally reviewed and noted the following in the patient's chart:   . Medical and social history . Use of alcohol, tobacco or illicit drugs  . Current medications and supplements . Functional ability and status . Nutritional status . Physical activity . Advanced directives . List of other physicians . Hospitalizations, surgeries, and ER visits in previous 12 months . Vitals . Screenings to include cognitive, depression, and falls . Referrals and appointments  In addition, I have reviewed and discussed with patient certain preventive protocols, quality metrics, and best practice recommendations. A written personalized care plan for preventive services as well as general preventive health recommendations were provided to patient.     Shela Nevin, [place]  06/01/2018  Medical screening examination/treatment was performed by qualified clinical staff member and as supervising physician I was immediately available for consultation/collaboration. I have reviewed documentation and agree with assessment and plan.  Penni Homans, MD

## 2018-06-01 ENCOUNTER — Encounter: Payer: Self-pay | Admitting: *Deleted

## 2018-06-01 ENCOUNTER — Ambulatory Visit (INDEPENDENT_AMBULATORY_CARE_PROVIDER_SITE_OTHER): Payer: Medicare HMO | Admitting: *Deleted

## 2018-06-01 ENCOUNTER — Ambulatory Visit (INDEPENDENT_AMBULATORY_CARE_PROVIDER_SITE_OTHER): Payer: Medicare HMO | Admitting: Family Medicine

## 2018-06-01 VITALS — BP 126/68 | HR 64 | Temp 97.6°F | Resp 18 | Wt 200.4 lb

## 2018-06-01 VITALS — BP 126/68 | HR 64 | Ht 69.0 in | Wt 200.0 lb

## 2018-06-01 DIAGNOSIS — Z0000 Encounter for general adult medical examination without abnormal findings: Secondary | ICD-10-CM | POA: Diagnosis not present

## 2018-06-01 DIAGNOSIS — D649 Anemia, unspecified: Secondary | ICD-10-CM

## 2018-06-01 DIAGNOSIS — R739 Hyperglycemia, unspecified: Secondary | ICD-10-CM | POA: Diagnosis not present

## 2018-06-01 DIAGNOSIS — I2510 Atherosclerotic heart disease of native coronary artery without angina pectoris: Secondary | ICD-10-CM

## 2018-06-01 DIAGNOSIS — I495 Sick sinus syndrome: Secondary | ICD-10-CM | POA: Diagnosis not present

## 2018-06-01 DIAGNOSIS — E782 Mixed hyperlipidemia: Secondary | ICD-10-CM

## 2018-06-01 DIAGNOSIS — R351 Nocturia: Secondary | ICD-10-CM

## 2018-06-01 NOTE — Patient Instructions (Signed)
Satiety Diet  DASH Eating Plan DASH stands for "Dietary Approaches to Stop Hypertension." The DASH eating plan is a healthy eating plan that has been shown to reduce high blood pressure (hypertension). It may also reduce your risk for type 2 diabetes, heart disease, and stroke. The DASH eating plan may also help with weight loss. What are tips for following this plan?  General guidelines  Avoid eating more than 2,300 mg (milligrams) of salt (sodium) a day. If you have hypertension, you may need to reduce your sodium intake to 1,500 mg a day.  Limit alcohol intake to no more than 1 drink a day for nonpregnant women and 2 drinks a day for men. One drink equals 12 oz of beer, 5 oz of wine, or 1 oz of hard liquor.  Work with your health care provider to maintain a healthy body weight or to lose weight. Ask what an ideal weight is for you.  Get at least 30 minutes of exercise that causes your heart to beat faster (aerobic exercise) most days of the week. Activities may include walking, swimming, or biking.  Work with your health care provider or diet and nutrition specialist (dietitian) to adjust your eating plan to your individual calorie needs. Reading food labels   Check food labels for the amount of sodium per serving. Choose foods with less than 5 percent of the Daily Value of sodium. Generally, foods with less than 300 mg of sodium per serving fit into this eating plan.  To find whole grains, look for the word "whole" as the first word in the ingredient list. Shopping  Buy products labeled as "low-sodium" or "no salt added."  Buy fresh foods. Avoid canned foods and premade or frozen meals. Cooking  Avoid adding salt when cooking. Use salt-free seasonings or herbs instead of table salt or sea salt. Check with your health care provider or pharmacist before using salt substitutes.  Do not fry foods. Cook foods using healthy methods such as baking, boiling, grilling, and broiling  instead.  Cook with heart-healthy oils, such as olive, canola, soybean, or sunflower oil. Meal planning  Eat a balanced diet that includes: ? 5 or more servings of fruits and vegetables each day. At each meal, try to fill half of your plate with fruits and vegetables. ? Up to 6-8 servings of whole grains each day. ? Less than 6 oz of lean meat, poultry, or fish each day. A 3-oz serving of meat is about the same size as a deck of cards. One egg equals 1 oz. ? 2 servings of low-fat dairy each day. ? A serving of nuts, seeds, or beans 5 times each week. ? Heart-healthy fats. Healthy fats called Omega-3 fatty acids are found in foods such as flaxseeds and coldwater fish, like sardines, salmon, and mackerel.  Limit how much you eat of the following: ? Canned or prepackaged foods. ? Food that is high in trans fat, such as fried foods. ? Food that is high in saturated fat, such as fatty meat. ? Sweets, desserts, sugary drinks, and other foods with added sugar. ? Full-fat dairy products.  Do not salt foods before eating.  Try to eat at least 2 vegetarian meals each week.  Eat more home-cooked food and less restaurant, buffet, and fast food.  When eating at a restaurant, ask that your food be prepared with less salt or no salt, if possible. What foods are recommended? The items listed may not be a complete list. Talk with  your dietitian about what dietary choices are best for you. Grains Whole-grain or whole-wheat bread. Whole-grain or whole-wheat pasta. Brown rice. [place]. Bulgur. Whole-grain and low-sodium cereals. Pita bread. Low-fat, low-sodium crackers. Whole-wheat flour tortillas. Vegetables Fresh or frozen vegetables (raw, steamed, roasted, or grilled). Low-sodium or reduced-sodium tomato and vegetable juice. Low-sodium or reduced-sodium tomato sauce and tomato paste. Low-sodium or reduced-sodium canned vegetables. Fruits All fresh, dried, or frozen fruit. Canned fruit in  natural juice (without added sugar). Meat and other protein foods Skinless chicken or [place]. Ground chicken or [place]. Pork with fat trimmed off. Fish and seafood. Egg whites. Dried beans, peas, or lentils. Unsalted nuts, nut butters, and seeds. Unsalted canned beans. Lean cuts of beef with fat trimmed off. Low-sodium, lean deli meat. Dairy Low-fat (1%) or fat-free (skim) milk. Fat-free, low-fat, or reduced-fat cheeses. Nonfat, low-sodium ricotta or cottage cheese. Low-fat or nonfat yogurt. Low-fat, low-sodium cheese. Fats and oils Soft margarine without trans fats. Vegetable oil. Low-fat, reduced-fat, or light mayonnaise and salad dressings (reduced-sodium). Canola, safflower, olive, soybean, and sunflower oils. Avocado. Seasoning and other foods Herbs. Spices. Seasoning mixes without salt. Unsalted popcorn and pretzels. Fat-free sweets. What foods are not recommended? The items listed may not be a complete list. Talk with your dietitian about what dietary choices are best for you. Grains Baked goods made with fat, such as croissants, muffins, or some breads. Dry pasta or rice meal packs. Vegetables Creamed or fried vegetables. Vegetables in a cheese sauce. Regular canned vegetables (not low-sodium or reduced-sodium). Regular canned tomato sauce and paste (not low-sodium or reduced-sodium). Regular tomato and vegetable juice (not low-sodium or reduced-sodium). Angie Fava. Olives. Fruits Canned fruit in a light or heavy syrup. Fried fruit. Fruit in cream or butter sauce. Meat and other protein foods Fatty cuts of meat. Ribs. Fried meat. Berniece Salines. Sausage. Bologna and other processed lunch meats. Salami. Fatback. Hotdogs. Bratwurst. Salted nuts and seeds. Canned beans with added salt. Canned or smoked fish. Whole eggs or egg yolks. Chicken or [place] with skin. Dairy Whole or 2% milk, cream, and half-and-half. Whole or full-fat cream cheese. Whole-fat or sweetened yogurt. Full-fat cheese. Nondairy  creamers. Whipped toppings. Processed cheese and cheese spreads. Fats and oils Butter. Stick margarine. Lard. Shortening. Ghee. Bacon fat. Tropical oils, such as coconut, palm kernel, or palm oil. Seasoning and other foods Salted popcorn and pretzels. Onion salt, garlic salt, seasoned salt, table salt, and sea salt. Worcestershire sauce. Tartar sauce. Barbecue sauce. Teriyaki sauce. Soy sauce, including reduced-sodium. Steak sauce. Canned and packaged gravies. Fish sauce. Oyster sauce. Cocktail sauce. Horseradish that you find on the shelf. Ketchup. Mustard. Meat flavorings and tenderizers. Bouillon cubes. Hot sauce and Tabasco sauce. Premade or packaged marinades. Premade or packaged taco seasonings. Relishes. Regular salad dressings. Where to find more information:  National Heart, Lung, and [place]: https://wilson-eaton.com/  American Heart Association: www.heart.org Summary  The DASH eating plan is a healthy eating plan that has been shown to reduce high blood pressure (hypertension). It may also reduce your risk for type 2 diabetes, heart disease, and stroke.  With the DASH eating plan, you should limit salt (sodium) intake to 2,300 mg a day. If you have hypertension, you may need to reduce your sodium intake to 1,500 mg a day.  When on the DASH eating plan, aim to eat more fresh fruits and vegetables, whole grains, lean proteins, low-fat dairy, and heart-healthy fats.  Work with your health care provider or diet and nutrition specialist (dietitian) to adjust your eating  plan to your individual calorie needs. This information is not intended to replace advice given to you by your health care provider. Make sure you discuss any questions you have with your health care provider. Document Released: 05/19/2011 Document Revised: 05/23/2016 Document Reviewed: 05/23/2016 Elsevier Interactive Patient Education  2019 [employer].

## 2018-06-01 NOTE — Patient Instructions (Signed)
Please schedule your next medicare wellness visit with me in 1 yr.  Continue to eat heart healthy diet (full of fruits, vegetables, whole grains, lean protein, water--limit salt, fat, and sugar intake) and increase physical activity as tolerated.  Continue doing brain stimulating activities (puzzles, reading, adult coloring books, staying active) to keep memory sharp.    Daniel Reyes , Thank you for taking time to come for your Medicare Wellness Visit. I appreciate your ongoing commitment to your health goals. Please review the following plan we discussed and let me know if I can assist you in the future.   These are the goals we discussed: Goals    . Downsize  home.    . Increase physical activity     Begin silver sneakers        This is a list of the screening recommended for you and due dates:  Health Maintenance  Topic Date Due  .  Hepatitis C: One time screening is recommended by Center for Disease Control  (CDC) for  adults born from [birth year] through 1964.   09-Dec-1945  . Flu Shot  01/11/2018  . Colon Cancer Screening  01/11/2025  . Tetanus Vaccine  05/30/2027  . Pneumonia vaccines  Completed     Health Maintenance, Male A healthy lifestyle and preventive care is important for your health and wellness. Ask your health care provider about what schedule of regular examinations is right for you. What should I know about weight and diet? Eat a Healthy Diet  Eat plenty of vegetables, fruits, whole grains, low-fat dairy products, and lean protein.  Do not eat a lot of foods high in solid fats, added sugars, or salt.  Maintain a Healthy Weight Regular exercise can help you achieve or maintain a healthy weight. You should:  Do at least 150 minutes of exercise each week. The exercise should increase your heart rate and make you sweat (moderate-intensity exercise).  Do strength-training exercises at least twice a week. Watch Your Levels of Cholesterol and Blood Lipids  Have your  blood tested for lipids and cholesterol every 5 years starting at 71 years of age. If you are at high risk for heart disease, you should start having your blood tested when you are 71 years old. You may need to have your cholesterol levels checked more often if: ? Your lipid or cholesterol levels are high. ? You are older than 71 years of age. ? You are at high risk for heart disease. What should I know about cancer screening? Many types of cancers can be detected early and may often be prevented. Lung Cancer  You should be screened every year for lung cancer if: ? You are a current smoker who has smoked for at least 30 years. ? You are a former smoker who has quit within the past 15 years.  Talk to your health care provider about your screening options, when you should start screening, and how often you should be screened. Colorectal Cancer  Routine colorectal cancer screening usually begins at 71 years of age and should be repeated every 5-10 years until you are 71 years old. You may need to be screened more often if early forms of precancerous polyps or small growths are found. Your health care provider may recommend screening at an earlier age if you have risk factors for colon cancer.  Your health care provider may recommend using home test kits to check for hidden blood in the stool.  A small camera at the  end of a tube can be used to examine your colon (sigmoidoscopy or colonoscopy). This checks for the earliest forms of colorectal cancer. Prostate and Testicular Cancer  Depending on your age and overall health, your health care provider may do certain tests to screen for prostate and testicular cancer.  Talk to your health care provider about any symptoms or concerns you have about testicular or prostate cancer. Skin Cancer  Check your skin from head to toe regularly.  Tell your health care provider about any new moles or changes in moles, especially if: ? There is a change in a  mole's size, shape, or color. ? You have a mole that is larger than a pencil eraser.  Always use sunscreen. Apply sunscreen liberally and repeat throughout the day.  Protect yourself by wearing long sleeves, pants, a wide-brimmed hat, and sunglasses when outside. What should I know about heart disease, diabetes, and high blood pressure?  If you are 42-52 years of age, have your blood pressure checked every 3-5 years. If you are 8 years of age or older, have your blood pressure checked every year. You should have your blood pressure measured twice-once when you are at a hospital or clinic, and once when you are not at a hospital or clinic. Record the average of the two measurements. To check your blood pressure when you are not at a hospital or clinic, you can use: ? An automated blood pressure machine at a pharmacy. ? A home blood pressure monitor.  Talk to your health care provider about your target blood pressure.  If you are between 62-56 years old, ask your health care provider if you should take aspirin to prevent heart disease.  Have regular diabetes screenings by checking your fasting blood sugar level. ? If you are at a normal weight and have a low risk for diabetes, have this test once every three years after the age of 10. ? If you are overweight and have a high risk for diabetes, consider being tested at a younger age or more often.  A one-time screening for abdominal aortic aneurysm (AAA) by ultrasound is recommended for men aged 43-75 years who are current or former smokers. What should I know about preventing infection? Hepatitis B If you have a higher risk for hepatitis B, you should be screened for this virus. Talk with your health care provider to find out if you are at risk for hepatitis B infection. Hepatitis C Blood testing is recommended for:  Everyone born from [birth year] through 1965.  Anyone with known risk factors for hepatitis C. Sexually Transmitted Diseases  (STDs)  You should be screened each year for STDs including gonorrhea and chlamydia if: ? You are sexually active and are younger than 71 years of age. ? You are older than 72 years of age and your health care provider tells you that you are at risk for this type of infection. ? Your sexual activity has changed since you were last screened and you are at an increased risk for chlamydia or gonorrhea. Ask your health care provider if you are at risk.  Talk with your health care provider about whether you are at high risk of being infected with HIV. Your health care provider may recommend a prescription medicine to help prevent HIV infection. What else can I do?  Schedule regular health, dental, and eye exams.  Stay current with your vaccines (immunizations).  Do not use any tobacco products, such as cigarettes, chewing tobacco, and e-cigarettes.  If you need help quitting, ask your health care provider.  Limit alcohol intake to no more than 2 drinks per day. One drink equals 12 ounces of beer, 5 ounces of wine, or 1 ounces of hard liquor.  Do not use street drugs.  Do not share needles.  Ask your health care provider for help if you need support or information about quitting drugs.  Tell your health care provider if you often feel depressed.  Tell your health care provider if you have ever been abused or do not feel safe at home. This information is not intended to replace advice given to you by your health care provider. Make sure you discuss any questions you have with your health care provider. Document Released: 11/26/2007 Document Revised: 01/27/2016 Document Reviewed: 03/03/2015 Elsevier Interactive Patient Education  2019 [employer].

## 2018-06-04 NOTE — Assessment & Plan Note (Signed)
hgba1c acceptable, minimize simple carbs. Increase exercise as tolerated.  

## 2018-06-04 NOTE — Progress Notes (Signed)
Subjective:    Patient ID: Daniel Reyes, male    DOB: 09/26/1945, 72 y.o.   MRN: [id number]  No chief complaint on file.   HPI Patient is in today for follow up. He had a pacemaker placed in his right chest wall two and half weeks ago and is healing well. No pain or trouble. No recent febrile illness or hospitalizations. Denies CP/palp/SOB/HA/congestion/fevers/GI or GU c/o. Taking meds as prescribed  Past Medical History:  Diagnosis Date  . History of chicken pox   . Hyperlipemia   . Mild CAD    a. cath 02/09/17 - Mild to moderate proximal and mid LAD plaque with less than 40% narrowing  . Nocturnal hypoxia     Past Surgical History:  Procedure Laterality Date  . BACK SURGERY  1980  . CHOLECYSTECTOMY OPEN  1998  . gerd surgery    . NECK SURGERY  1983  . PACEMAKER IMPLANT N/A 05/15/2018   Procedure: PACEMAKER IMPLANT - daul chamber;  Surgeon: Evans Lance, MD;  Location: [facility];  Service: Cardiovascular;  Laterality: N/A;  . RIGHT/LEFT HEART CATH AND CORONARY ANGIOGRAPHY N/A 02/09/2017   Procedure: RIGHT/LEFT HEART CATH AND CORONARY ANGIOGRAPHY;  Surgeon: Belva Crome, MD;  Location: [facility];  Service: Cardiovascular;  Laterality: N/A;  . TONSILLECTOMY AND ADENOIDECTOMY      Family History  Problem Relation Age of Onset  . Heart attack Father   . Colon cancer Father   . Cancer Father   . Alzheimer's disease Mother   . Stroke Brother   . Heart disease Brother        afib  . Stroke Maternal Grandmother   . Mental illness Maternal Grandfather        suicide  . Alzheimer's disease Paternal Grandmother   . Peripheral vascular disease Paternal Grandfather   . Alcohol abuse Neg Hx     Social History   Socioeconomic History  . Marital status: Married    Spouse name: Not on file  . Number of children: 3  . Years of education: Not on file  . Highest education level: Not on file  Occupational History  . Occupation: retired-owner of [employer]  . Financial resource strain: Not on file  . Food insecurity:    Worry: Not on file    Inability: Not on file  . Transportation needs:    Medical: Not on file    Non-medical: Not on file  Tobacco Use  . Smoking status: Never Smoker  . Smokeless tobacco: Never Used  Substance and Sexual Activity  . Alcohol use: Yes    Comment: very rarely  . Drug use: No  . Sexual activity: Not Currently  Lifestyle  . Physical activity:    Days per week: Not on file    Minutes per session: Not on file  . Stress: Not on file  Relationships  . Social connections:    Talks on phone: Not on file    Gets together: Not on file    Attends religious service: Not on file    Active member of club or organization: Not on file    Attends meetings of clubs or organizations: Not on file    Relationship status: Not on file  . Intimate partner violence:    Fear of current or ex partner: Not on file    Emotionally abused: Not on file    Physically abused: Not on file  Forced sexual activity: Not on file  Other Topics Concern  . Not on file  Social History Narrative   Lives with wife, retired Pharmacist, community, no major dietary restrictions, negative cigarettes. Rare alcohol, wears seat belt most of the time    Outpatient Medications Prior to Visit  Medication Sig Dispense Refill  . albuterol (PROVENTIL HFA;VENTOLIN HFA) 108 (90 Base) MCG/ACT inhaler Inhale 2 puffs into the lungs every 6 (six) hours as needed for wheezing or shortness of breath. 1 Inhaler 2  . atorvastatin (LIPITOR) 20 MG tablet Take 1 tablet (20 mg total) by mouth 2 (two) times daily. (Patient taking differently: Take 20 mg by mouth daily. ) 90 tablet 3  . fluticasone furoate-vilanterol (BREO ELLIPTA) 100-25 MCG/INH AEPB Inhale 1 puff into the lungs daily. 2 each 0  . Multiple Vitamin (MULTIVITAMIN WITH MINERALS) TABS tablet Take 1 tablet by mouth daily.    Marland Kitchen aspirin 81 MG EC tablet Take 1 tablet (81  mg total) by mouth daily. 90 tablet 3   No facility-administered medications prior to visit.     Allergies  Allergen Reactions  . Imdur [Isosorbide Dinitrate] Anaphylaxis and Nausea And Vomiting    Nose swelling with difficulty breathing thru nose    Review of Systems  Constitutional: Negative for fever and malaise/fatigue.  HENT: Negative for congestion.   Eyes: Negative for blurred vision.  Respiratory: Negative for shortness of breath.   Cardiovascular: Negative for chest pain, palpitations and leg swelling.  Gastrointestinal: Negative for abdominal pain, blood in stool and nausea.  Genitourinary: Negative for dysuria and frequency.  Musculoskeletal: Negative for falls.  Skin: Negative for rash.  Neurological: Negative for dizziness, loss of consciousness and headaches.  Endo/Heme/Allergies: Negative for environmental allergies.  Psychiatric/Behavioral: Negative for depression. The patient is not nervous/anxious.        Objective:    Physical Exam Vitals signs and nursing note reviewed.  Constitutional:      General: He is not in acute distress.    Appearance: He is well-developed.  HENT:     Head: Normocephalic and atraumatic.     Nose: Nose normal.  Eyes:     General:        Right eye: No discharge.        Left eye: No discharge.  Neck:     Musculoskeletal: Normal range of motion and neck supple.  Cardiovascular:     Rate and Rhythm: Normal rate and regular rhythm.     Heart sounds: No murmur.     Comments: Pacer in place in Right Upper CW. No erythema or fluctuance Pulmonary:     Effort: Pulmonary effort is normal.     Breath sounds: Normal breath sounds.  Abdominal:     General: Bowel sounds are normal.     Palpations: Abdomen is soft.     Tenderness: There is no abdominal tenderness.  Skin:    General: Skin is warm and dry.  Neurological:     Mental Status: He is alert and oriented to person, place, and time.     BP 126/68 (BP Location: Left Arm,  Patient Position: Sitting, Cuff Size: Normal)   Pulse 64   Temp 97.6 F (36.4 C) (Oral)   Resp 18   Wt 200 lb 6.4 oz (90.9 kg)   SpO2 98%   BMI 29.59 kg/m  Wt Readings from Last 3 Encounters:  06/01/18 200 lb (90.7 kg)  06/01/18 200 lb 6.4 oz (90.9 kg)  05/16/18 194 lb 11.2  oz (88.3 kg)     Lab Results  Component Value Date   WBC 6.9 05/07/2018   HGB 14.2 05/07/2018   HCT 43.2 05/07/2018   PLT 228 05/07/2018   GLUCOSE 94 05/07/2018   CHOL 169 11/30/2017   TRIG 128.0 11/30/2017   HDL 58.90 11/30/2017   LDLCALC 85 11/30/2017   ALT 26 11/30/2017   AST 17 11/30/2017   NA 142 05/07/2018   K 4.1 05/07/2018   CL 105 05/07/2018   CREATININE 1.03 05/07/2018   BUN 19 05/07/2018   CO2 21 05/07/2018   TSH 1.31 11/30/2017   PSA 0.27 11/30/2017   INR 1.05 02/08/2017   HGBA1C 6.1 11/30/2017    Lab Results  Component Value Date   TSH 1.31 11/30/2017   Lab Results  Component Value Date   WBC 6.9 05/07/2018   HGB 14.2 05/07/2018   HCT 43.2 05/07/2018   MCV 86 05/07/2018   PLT 228 05/07/2018   Lab Results  Component Value Date   NA 142 05/07/2018   K 4.1 05/07/2018   CO2 21 05/07/2018   GLUCOSE 94 05/07/2018   BUN 19 05/07/2018   CREATININE 1.03 05/07/2018   BILITOT 0.7 11/30/2017   ALKPHOS 52 11/30/2017   AST 17 11/30/2017   ALT 26 11/30/2017   PROT 6.7 11/30/2017   ALBUMIN 4.1 11/30/2017   CALCIUM 9.5 05/07/2018   ANIONGAP 6 02/10/2017   GFR 75.68 11/30/2017   Lab Results  Component Value Date   CHOL 169 11/30/2017   Lab Results  Component Value Date   HDL 58.90 11/30/2017   Lab Results  Component Value Date   LDLCALC 85 11/30/2017   Lab Results  Component Value Date   TRIG 128.0 11/30/2017   Lab Results  Component Value Date   CHOLHDL 3 11/30/2017   Lab Results  Component Value Date   HGBA1C 6.1 11/30/2017       Assessment & Plan:   Problem List Items Addressed This Visit    Hyperlipidemia - Primary    Tolerating statin, encouraged  heart healthy diet, avoid trans fats, minimize simple carbs and saturated fats. Increase exercise as tolerated      Relevant Orders   Lipid panel   Mild CAD    Follows with cardiology and is doing well on current meds.       Relevant Orders   TSH   Hyperglycemia    hgba1c acceptable, minimize simple carbs. Increase exercise as tolerated.       Relevant Orders   Hemoglobin A1c   Comprehensive metabolic panel   TSH   Anemia   Relevant Orders   CBC   Nocturia   Relevant Orders   PSA   Sinus node dysfunction (HCC)    Had his pacer placed a couple of weeks ago and has tolerated the placement well.          I am having Daniel Reyes. Theisen maintain his atorvastatin, aspirin, albuterol, fluticasone furoate-vilanterol, and multivitamin with minerals.  No orders of the defined types were placed in this encounter.    Penni Homans, MD

## 2018-06-04 NOTE — Assessment & Plan Note (Signed)
Had his pacer placed a couple of weeks ago and has tolerated the placement well.

## 2018-06-04 NOTE — Assessment & Plan Note (Signed)
Follows with cardiology and is doing well on current meds.

## 2018-06-04 NOTE — Assessment & Plan Note (Signed)
Tolerating statin, encouraged heart healthy diet, avoid trans fats, minimize simple carbs and saturated fats. Increase exercise as tolerated 

## 2018-06-12 DIAGNOSIS — G4733 Obstructive sleep apnea (adult) (pediatric): Secondary | ICD-10-CM | POA: Diagnosis not present

## 2018-06-29 ENCOUNTER — Other Ambulatory Visit: Payer: Self-pay | Admitting: Internal Medicine

## 2018-07-25 ENCOUNTER — Telehealth: Payer: Self-pay | Admitting: Pulmonary Disease

## 2018-07-25 NOTE — Telephone Encounter (Signed)
Unable to leave voice mail not set up will call back.

## 2018-07-26 NOTE — Telephone Encounter (Signed)
Called and spoke with patient he stated that he the Memory Dance has gotten expensive and he would like something else that is affordable. Advised patient that he would need to get his insurance formulary so that RA can see what is most beneficial to him off of that. Patient stated that he would bring this by. Will await formulary.

## 2018-07-30 NOTE — Telephone Encounter (Signed)
ATC Patient to see if he had contacted his insurance for formulary. No answer and unable to leave VM, because it is not set up ant this time.

## 2018-07-31 NOTE — Telephone Encounter (Signed)
Called and spoke with Daniel Reyes himself, asked patient if he was able to get his formulary from his insurance. Patient stated that when he checked with his insurance company there were not any non generic inhalers are his formulary and everything that they would cover would be around the same price as the Breo. Patient stated that he will purchase this months inhaler of breo and the discuss with RA at his appointment on 3/9 if he can come off of it completely. Will route this over to RA as an FYI since patient does have an upcoming appointment.

## 2018-08-01 ENCOUNTER — Ambulatory Visit: Payer: Self-pay

## 2018-08-01 ENCOUNTER — Telehealth: Payer: Self-pay | Admitting: Family Medicine

## 2018-08-01 NOTE — Telephone Encounter (Signed)
Noted  

## 2018-08-01 NOTE — Telephone Encounter (Signed)
Copied from [place] [id number]. Topic: Quick Communication - Rx Refill/Question >> Aug 01, 2018  9:59 AM Reyne Dumas L wrote: Medication: Prednisone  Pt states that the is on vacation in [place] and he has a reaction to the sun that causes severe itching.  Pt would like prednisone called in so that he can get that under control.  Pt can be reached at [phone]  Has the patient contacted their pharmacy? No - this will be a new script (Agent: If no, request that the patient contact the pharmacy for the refill.) (Agent: If yes, when and what did the pharmacy advise?)  Preferred Pharmacy (with phone number or street name): [facility] [place], [address] [place] [phone] (Phone) [phone] (Fax)  Agent: Please be advised that RX refills may take up to 3 business days. We ask that you follow-up with your pharmacy.

## 2018-08-01 NOTE — Telephone Encounter (Signed)
Pt. Returned call to discuss symptoms; see Triage note of 08/01/18.

## 2018-08-01 NOTE — Telephone Encounter (Signed)
Pt. called from the [place] to request Rx for Prednisone from Dr. Charlett Blake.  Reported he has had sensitivity to the sun for years, even with using sunscreen.  Reported he was in the sun all day, yesterday, and was wearing sunscreen with "45" level of protection.  He woke up today with severe itching of his neck and hands, which were areas exposed to the sun.  Was wearing a long-sleeved shirt.  Denied any visible rash or redness.  Denied any break in skin integrity.  Reported that Prednisone has helped in the past.  Denied any fever/ chills, or any other symptoms.  Advised that Dr. Charlett Blake is not in office the rest of the week.  Gave care advice for Home Care treatment.   Suggested he go to a Walk In clinic if he is seeking Prednisone, or any other prescription.  Pt. Verb. Understanding of recommendations.            Reason for Disposition . Mild localized itching  Answer Assessment - Initial Assessment Questions 1. DESCRIPTION: "Describe the itching you are having." "Where is it located?"     Hands and neck  2. SEVERITY: "How bad is it?"    - MILD - doesn't interfere with normal activities   - MODERATE - SEVERE: interferes with work, school, sleep, or other activities      Severe  3. SCRATCHING: "Are there any scratch marks? Bleeding?"     Denied any scratch marks or break in skin integrity 4. ONSET: "When did the itching begin?"      This morning 5. CAUSE: "What do you think is causing the itching?"      Sun exposure  6. OTHER SYMPTOMS: "Do you have any other symptoms?"      Denied any other symptoms 7. PREGNANCY: "Is there any chance you are pregnant?" "When was your last menstrual period?"     N/a  Protocols used: ITCHING - LOCALIZED-A-AH

## 2018-08-01 NOTE — Telephone Encounter (Signed)
Will review during office visit

## 2018-08-01 NOTE — Telephone Encounter (Signed)
Attempted to call pt. To discuss symptoms; left vm to return call to office to speak to Triage nurse.

## 2018-08-20 ENCOUNTER — Ambulatory Visit: Payer: Medicare HMO | Admitting: Pulmonary Disease

## 2018-08-22 ENCOUNTER — Ambulatory Visit: Payer: Medicare HMO | Admitting: Pulmonary Disease

## 2018-08-22 ENCOUNTER — Other Ambulatory Visit: Payer: Self-pay

## 2018-08-22 ENCOUNTER — Encounter: Payer: Self-pay | Admitting: Pulmonary Disease

## 2018-08-22 DIAGNOSIS — J4520 Mild intermittent asthma, uncomplicated: Secondary | ICD-10-CM | POA: Diagnosis not present

## 2018-08-22 DIAGNOSIS — G4733 Obstructive sleep apnea (adult) (pediatric): Secondary | ICD-10-CM | POA: Diagnosis not present

## 2018-08-22 DIAGNOSIS — Z9989 Dependence on other enabling machines and devices: Secondary | ICD-10-CM | POA: Diagnosis not present

## 2018-08-22 NOTE — Assessment & Plan Note (Signed)
OK to decrease breo to every other day x 1 month , then stop He will call back if he has increased use of albuterol Not convinced this is true asthma

## 2018-08-22 NOTE — Patient Instructions (Signed)
OK to decrease breo to every other day x 1 month , then stop Use CPAP if increased sleepiness, fatigue

## 2018-08-22 NOTE — Progress Notes (Signed)
   Subjective:    Patient ID: Daniel Reyes, male    DOB: 06-Sep-1945, 73 y.o.   MRN: [id number]  HPI  73 yo never smoker for follow-up of dyspnea on exertionx2-3 yrs & mild OSA  Work-up : He had a negative cardiac evaluation.TSH and cortisol levels were normal. He was not orthostatic. Objectivelyhe hadone episode of desaturation on climbing stairs and PFTs showed some reversibility with decreased DLCO.    Chief Complaint  Patient presents with  . Follow-up    6 month follow up for asthma and OSA. States he is not using his CPAP machine. Wants to discuss coming off of the [facility].    He had subjective improvement with Breo  Has only needed albuterol once every 2 to 3 months.  Feels good overall.  He is going fishing today. We discussed social distancing with coronavirus. His compliance with CPAP is dropped off.  He has not used in last 2 months. Download prior to that shows good control of events with average pressure of 10 cm on auto settings  He had pacemaker placed for sinus node dysfunction and likely improvement in dyspnea was related to this  Significant tests/ events reviewed  Nocturnal oximetry8/2018 >>showed 10.9 minutes of oxygen desaturation less than 88% with oxygen desaturation index of 25 over an 8.5 hours of monitoring, his average pulse was 54/m With lowest pulse documented as 47.  Echo 01/2017 did not show any evidence of pulmonary hypertension.Exercise stress test showed a hypotensive response  PFTs 02/2017 ratio 69 with FEV1 of 84% and FVC of 89% with significant broncho-dilator response, TLC was preserved at 55% and DLCO was decreased at 52% corrected for alveolar volume to 62%.  Orthostatics normal. He desaturated to 79% on walking up and down the stairs with heart rate increased to 11 9/m and recovered within 2 minutes. He did not desaturate when walking on level ground  02/2017 CT angio neg PE< neg fibrosis   9/2018Home sleep study showed  mild OSA with AHI of 12/hour His desaturation was mild-only 18 minutes with saturation less than 90%  Review of Systems neg for any significant sore throat, dysphagia, itching, sneezing, nasal congestion or excess/ purulent secretions, fever, chills, sweats, unintended wt loss, pleuritic or exertional cp, hempoptysis, orthopnea pnd or change in chronic leg swelling. Also denies presyncope, palpitations, heartburn, abdominal pain, nausea, vomiting, diarrhea or change in bowel or urinary habits, dysuria,hematuria, rash, arthralgias, visual complaints, headache, numbness weakness or ataxia.     Objective:   Physical Exam   Gen. Pleasant, well-nourished, in no distress ENT - no thrush, no pallor/icterus,no post nasal drip Neck: No JVD, no thyromegaly, no carotid bruits Lungs: no use of accessory muscles, no dullness to percussion, clear without rales or rhonchi  Cardiovascular: Rhythm regular, heart sounds  normal, no murmurs or gallops, no peripheral edema Musculoskeletal: No deformities, no cyanosis or clubbing         Assessment & Plan:

## 2018-08-22 NOTE — Assessment & Plan Note (Signed)
His compliance is dropped off but then CPAP did not provide subjective improvement in sleepiness/fatigue or quality of life  The cardiovascular consequence of mild OSA are debatable. Treatment is only recommended if he is symptomatic with excessive sleepiness

## 2018-08-27 ENCOUNTER — Ambulatory Visit (INDEPENDENT_AMBULATORY_CARE_PROVIDER_SITE_OTHER): Payer: Medicare HMO | Admitting: *Deleted

## 2018-08-27 ENCOUNTER — Telehealth: Payer: Self-pay

## 2018-08-27 DIAGNOSIS — I495 Sick sinus syndrome: Secondary | ICD-10-CM | POA: Diagnosis not present

## 2018-08-27 NOTE — Telephone Encounter (Signed)
Attempted to troubleshoot monitor. Will plan to move monitor to a different location in the house and try again. Advised patient I will call him back in about 30 min. He is agreeable to this plan.

## 2018-08-27 NOTE — Telephone Encounter (Signed)
Call placed to Pt.  Advised at this time-routine follow up appointments are being cancelled to reduce possible exposure for our patients.  Per Pt-feels has had some dizziness since PPM placement.  Better than before PPM.  Advised will have Pt scheduled for remote check.  Follow up timing to be based off remote check.  Advised appointment would be cancelled and Pt would be contacted to reschedule.  Advised to call office if any needs.  Scheduled for remote check.  Continue to monitor.

## 2018-08-27 NOTE — Telephone Encounter (Signed)
Returned call to Pt's wife.  Advised per remote check Pt's device is working appropriately.  Advised that Pt c/o of dizziness and light headedness is probably d/t changes in blood pressure with position changes.  Pt is not on any blood pressure medications.  Advised will discuss with Dr. Lovena Le and return Pt call on Wednesday.  Wife thankful for follow up.  Will return call.

## 2018-08-27 NOTE — Telephone Encounter (Signed)
Call returned to family.  Advised to go to box and push heart.  Pt will send remote.

## 2018-08-27 NOTE — Telephone Encounter (Signed)
Transmission received and reviewed. Normal device function. Lead trends stable. 71% Ap, 3% Vp. Presenting rhythm shows Ap/Vs and As/Vs at ~60bpm. No atrial or ventricular high rate episodes noted. Histograms appropriate.

## 2018-08-28 ENCOUNTER — Other Ambulatory Visit: Payer: Self-pay

## 2018-08-29 ENCOUNTER — Encounter: Payer: Medicare HMO | Admitting: Internal Medicine

## 2018-08-29 ENCOUNTER — Telehealth: Payer: Self-pay

## 2018-08-29 NOTE — Telephone Encounter (Signed)
Called pt to tell him to increase salt and fluid intake to help with BP and dizziness. Pt said he has been doing that for the past few days. Instructed pt he would receive a call from the [place] to schedule his next f/u appt. Pt understood and was appreciative.

## 2018-08-30 NOTE — Telephone Encounter (Signed)
Call returned to Pt/wife.  Advised to increase salt and fluids.  Will follow up as soon as possible.

## 2018-09-01 LAB — CUP PACEART REMOTE DEVICE CHECK
Battery Remaining Longevity: 156 mo
Battery Remaining Percentage: 100 %
Brady Statistic RA Percent Paced: 71 %
Brady Statistic RV Percent Paced: 3 %
Date Time Interrogation Session: 20200316200100
Implantable Lead Implant Date: 20191203
Implantable Lead Location: 753859
Implantable Lead Location: 753860
Implantable Lead Model: 7740
Implantable Lead Model: 7741
Implantable Lead Serial Number: 1095486
Implantable Pulse Generator Implant Date: 20191203
Lead Channel Impedance Value: 668 Ohm
Lead Channel Impedance Value: 746 Ohm
Lead Channel Pacing Threshold Amplitude: 0.4 V
Lead Channel Pacing Threshold Amplitude: 1 V
Lead Channel Pacing Threshold Pulse Width: 0.4 ms
Lead Channel Setting Pacing Amplitude: 3.5 V
Lead Channel Setting Pacing Amplitude: 3.5 V
Lead Channel Setting Pacing Pulse Width: 0.4 ms
MDC IDC LEAD IMPLANT DT: 20191203
MDC IDC LEAD SERIAL: 711800
MDC IDC MSMT LEADCHNL RA PACING THRESHOLD PULSEWIDTH: 0.4 ms
MDC IDC SET LEADCHNL RV SENSING SENSITIVITY: 2.5 mV
Pulse Gen Serial Number: 838922

## 2018-09-04 ENCOUNTER — Telehealth: Payer: Self-pay | Admitting: Pulmonary Disease

## 2018-09-04 MED ORDER — NYSTATIN 100000 UNIT/ML MT SUSP: OROMUCOSAL | 0 refills | Status: DC

## 2018-09-04 NOTE — Telephone Encounter (Signed)
Nystatin 5 mL twice daily swish and swallow for 7 days

## 2018-09-04 NOTE — Telephone Encounter (Signed)
Spoke to pt and advised of DR Alva's recommendations.  RX sent to pharmacy.  Pt verbalized understanding.  Nothing further needed at this time.

## 2018-09-04 NOTE — Progress Notes (Signed)
Remote pacemaker transmission.   

## 2018-09-04 NOTE — Telephone Encounter (Signed)
Call made to patient, he states since using his [employer] he has developed white patches on his tongue and irritation in his mouth. He is requesting something for thrush.   RA please advise. Thanks.

## 2018-09-25 ENCOUNTER — Telehealth: Payer: Self-pay | Admitting: Family Medicine

## 2018-09-25 NOTE — Telephone Encounter (Signed)
Patient scheduled doxy w/pcp

## 2018-09-25 NOTE — Telephone Encounter (Signed)
Patient called and left a message in regards to receiving a call back. He is wanting something called in for the constipation issues he is having and has been having for the past week. Call back number is [phone] (mobile)

## 2018-09-28 ENCOUNTER — Ambulatory Visit (INDEPENDENT_AMBULATORY_CARE_PROVIDER_SITE_OTHER): Payer: Medicare HMO | Admitting: Family Medicine

## 2018-09-28 ENCOUNTER — Other Ambulatory Visit: Payer: Self-pay

## 2018-09-28 DIAGNOSIS — R739 Hyperglycemia, unspecified: Secondary | ICD-10-CM | POA: Diagnosis not present

## 2018-09-28 DIAGNOSIS — E782 Mixed hyperlipidemia: Secondary | ICD-10-CM

## 2018-09-28 DIAGNOSIS — K5900 Constipation, unspecified: Secondary | ICD-10-CM | POA: Diagnosis not present

## 2018-09-28 NOTE — Assessment & Plan Note (Signed)
hgba1c acceptable, minimize simple carbs. Increase exercise as tolerated.  

## 2018-09-28 NOTE — Assessment & Plan Note (Addendum)
Encouraged increased hydration and fiber in diet. Daily probiotics. If bowels not moving can use MOM 2 tbls po in 4 oz of warm prune juice by mouth every 2-3 days. If no results then repeat in 4 hours with  Dulcolax suppository pr, may repeat again in 4 more hours as needed. Seek care if symptoms worsen. Consider daily Miralax and/or Dulcolax if symptoms persist. Consider mixing Miralax with Benefiber and taking once to twice daily. Took an over the counter docusate with Senna but it did only help a small amount. Can go a whole week without a BM worse usually when traveling.

## 2018-09-28 NOTE — Progress Notes (Signed)
Virtual Visit via Video Note  I connected with Daniel Reyes on 09/28/18 at 10:40 AM EDT by a video enabled telemedicine application and verified that I am speaking with the correct person using two identifiers.   I discussed the limitations of evaluation and management by telemedicine and the availability of in person appointments. The patient expressed understanding and agreed to proceed. Magdalene Molly, CMA attempted to get patient on the video platform and was unable to complete so visit was completed via the telephone platform    Subjective:    Patient ID: Daniel Reyes, male    DOB: [birth year], 73 y.o.   MRN: [id number]  No chief complaint on file.   HPI Patient is in today for evaluation of constipation, hyperglycemia and hyperlipidemia. He called to discuss a long history of intermittent constipation especially when he travels. It is not unusual for him to go a week. He is now home and he hit a week so he picked up some generic pericolace and took 2 which did move things some. No bloody or tarry stool. No recent febrile illness or hospitalizations. No c/o polyuria or polydipsia. No c/o CP/palp/SOB/HA/congestion/fevers. Taking meds as prescribed  Past Medical History:  Diagnosis Date  . History of chicken pox   . Hyperlipemia   . Mild CAD    a. cath 02/09/17 - Mild to moderate proximal and mid LAD plaque with less than 40% narrowing  . Nocturnal hypoxia     Past Surgical History:  Procedure Laterality Date  . BACK SURGERY  1980  . CHOLECYSTECTOMY OPEN  1998  . gerd surgery    . NECK SURGERY  1983  . PACEMAKER IMPLANT N/A 05/15/2018   Procedure: PACEMAKER IMPLANT - daul chamber;  Surgeon: Evans Lance, MD;  Location: [facility];  Service: Cardiovascular;  Laterality: N/A;  . RIGHT/LEFT HEART CATH AND CORONARY ANGIOGRAPHY N/A 02/09/2017   Procedure: RIGHT/LEFT HEART CATH AND CORONARY ANGIOGRAPHY;  Surgeon: Belva Crome, MD;  Location: [facility];  Service:  Cardiovascular;  Laterality: N/A;  . TONSILLECTOMY AND ADENOIDECTOMY      Family History  Problem Relation Age of Onset  . Heart attack Father   . Colon cancer Father   . Cancer Father   . Alzheimer's disease Mother   . Stroke Brother   . Heart disease Brother        afib  . Stroke Maternal Grandmother   . Mental illness Maternal Grandfather        suicide  . Alzheimer's disease Paternal Grandmother   . Peripheral vascular disease Paternal Grandfather   . Alcohol abuse Neg Hx     Social History   Socioeconomic History  . Marital status: Married    Spouse name: Not on file  . Number of children: 3  . Years of education: Not on file  . Highest education level: Not on file  Occupational History  . Occupation: retired-owner of [employer]  . Financial resource strain: Not on file  . Food insecurity:    Worry: Not on file    Inability: Not on file  . Transportation needs:    Medical: Not on file    Non-medical: Not on file  Tobacco Use  . Smoking status: Never Smoker  . Smokeless tobacco: Never Used  Substance and Sexual Activity  . Alcohol use: Yes    Comment: very rarely  . Drug use: No  . Sexual activity: Not Currently  Lifestyle  . Physical activity:    Days per week: Not on file    Minutes per session: Not on file  . Stress: Not on file  Relationships  . Social connections:    Talks on phone: Not on file    Gets together: Not on file    Attends religious service: Not on file    Active member of club or organization: Not on file    Attends meetings of clubs or organizations: Not on file    Relationship status: Not on file  . Intimate partner violence:    Fear of current or ex partner: Not on file    Emotionally abused: Not on file    Physically abused: Not on file    Forced sexual activity: Not on file  Other Topics Concern  . Not on file  Social History Narrative   Lives with wife, retired Pharmacist, community, no  major dietary restrictions, negative cigarettes. Rare alcohol, wears seat belt most of the time    Outpatient Medications Prior to Visit  Medication Sig Dispense Refill  . albuterol (PROVENTIL HFA;VENTOLIN HFA) 108 (90 Base) MCG/ACT inhaler Inhale 2 puffs into the lungs every 6 (six) hours as needed for wheezing or shortness of breath. 1 Inhaler 2  . atorvastatin (LIPITOR) 20 MG tablet Take 1 tablet (20 mg total) by mouth 2 (two) times daily. (Patient taking differently: Take 20 mg by mouth daily. ) 90 tablet 3  . fluticasone furoate-vilanterol (BREO ELLIPTA) 100-25 MCG/INH AEPB Inhale 1 puff into the lungs daily. 2 each 0  . Multiple Vitamin (MULTIVITAMIN WITH MINERALS) TABS tablet Take 1 tablet by mouth daily.    Marland Kitchen nystatin (MYCOSTATIN) 100000 UNIT/ML suspension Swish and swallow 5 ml in mouth twice daily for 7 days 60 mL 0   No facility-administered medications prior to visit.     Allergies  Allergen Reactions  . Imdur [Isosorbide Dinitrate] Anaphylaxis and Nausea And Vomiting    Nose swelling with difficulty breathing thru nose    Review of Systems  Constitutional: Negative for fever and malaise/fatigue.  HENT: Negative for congestion.   Respiratory: Negative for shortness of breath.   Cardiovascular: Negative for chest pain, palpitations and leg swelling.  Gastrointestinal: Positive for constipation. Negative for abdominal pain, blood in stool, diarrhea, melena and nausea.  Genitourinary: Negative for dysuria and frequency.  Musculoskeletal: Negative for falls.  Skin: Negative for rash.  Neurological: Negative for loss of consciousness and headaches.  Endo/Heme/Allergies: Negative for environmental allergies.  Psychiatric/Behavioral: The patient is not nervous/anxious.        Objective:    Physical Exam  There were no vitals taken for this visit. Wt Readings from Last 3 Encounters:  08/22/18 197 lb 12.8 oz (89.7 kg)  06/01/18 200 lb (90.7 kg)  06/01/18 200 lb 6.4 oz  (90.9 kg)    Diabetic Foot Exam - Simple   No data filed     Lab Results  Component Value Date   WBC 6.9 05/07/2018   HGB 14.2 05/07/2018   HCT 43.2 05/07/2018   PLT 228 05/07/2018   GLUCOSE 94 05/07/2018   CHOL 169 11/30/2017   TRIG 128.0 11/30/2017   HDL 58.90 11/30/2017   LDLCALC 85 11/30/2017   ALT 26 11/30/2017   AST 17 11/30/2017   NA 142 05/07/2018   K 4.1 05/07/2018   CL 105 05/07/2018   CREATININE 1.03 05/07/2018   BUN 19 05/07/2018   CO2 21 05/07/2018   TSH 1.31 11/30/2017  PSA 0.27 11/30/2017   INR 1.05 02/08/2017   HGBA1C 6.1 11/30/2017    Lab Results  Component Value Date   TSH 1.31 11/30/2017   Lab Results  Component Value Date   WBC 6.9 05/07/2018   HGB 14.2 05/07/2018   HCT 43.2 05/07/2018   MCV 86 05/07/2018   PLT 228 05/07/2018   Lab Results  Component Value Date   NA 142 05/07/2018   K 4.1 05/07/2018   CO2 21 05/07/2018   GLUCOSE 94 05/07/2018   BUN 19 05/07/2018   CREATININE 1.03 05/07/2018   BILITOT 0.7 11/30/2017   ALKPHOS 52 11/30/2017   AST 17 11/30/2017   ALT 26 11/30/2017   PROT 6.7 11/30/2017   ALBUMIN 4.1 11/30/2017   CALCIUM 9.5 05/07/2018   ANIONGAP 6 02/10/2017   GFR 75.68 11/30/2017   Lab Results  Component Value Date   CHOL 169 11/30/2017   Lab Results  Component Value Date   HDL 58.90 11/30/2017   Lab Results  Component Value Date   LDLCALC 85 11/30/2017   Lab Results  Component Value Date   TRIG 128.0 11/30/2017   Lab Results  Component Value Date   CHOLHDL 3 11/30/2017   Lab Results  Component Value Date   HGBA1C 6.1 11/30/2017       Assessment & Plan:   Problem List Items Addressed This Visit    Hyperlipidemia    Tolerating statin, encouraged heart healthy diet, avoid trans fats, minimize simple carbs and saturated fats. Increase exercise as tolerated. Has an appointment in June for lab work      Hyperglycemia    hgba1c acceptable, minimize simple carbs. Increase exercise as  tolerated.       Constipation    Encouraged increased hydration and fiber in diet. Daily probiotics. If bowels not moving can use MOM 2 tbls po in 4 oz of warm prune juice by mouth every 2-3 days. If no results then repeat in 4 hours with  Dulcolax suppository pr, may repeat again in 4 more hours as needed. Seek care if symptoms worsen. Consider daily Miralax and/or Dulcolax if symptoms persist. Consider mixing Miralax with Benefiber and taking once to twice daily. Took an over the counter docusate with Senna but it did only help a small amount. Can go a whole week without a BM worse usually when traveling.          I am having Daniel Reyes. [place] maintain his atorvastatin, albuterol, fluticasone furoate-vilanterol, multivitamin with minerals, and nystatin.  No orders of the defined types were placed in this encounter.    Penni Homans, MD   I discussed the assessment and treatment plan with the patient. The patient was provided an opportunity to ask questions and all were answered. The patient agreed with the plan and demonstrated an understanding of the instructions.   The patient was advised to call back or seek an in-person evaluation if the symptoms worsen or if the condition fails to improve as anticipated.  I provided non-face-to-face time during this encounter.   Penni Homans, MD

## 2018-09-28 NOTE — Assessment & Plan Note (Addendum)
Tolerating statin, encouraged heart healthy diet, avoid trans fats, minimize simple carbs and saturated fats. Increase exercise as tolerated. Has an appointment in June for lab work

## 2018-09-28 NOTE — Patient Instructions (Signed)
Encouraged increased hydration and fiber in diet. Daily probiotics. If bowels not moving can use MOM 2 tbls po in 4 oz of warm prune juice by mouth every 2-3 days. If no results then repeat in 4 hours with  Dulcolax suppository pr, may repeat again in 4 more hours as needed. Seek care if symptoms worsen. Consider daily Miralax and/or Dulcolax if symptoms persist.   If none of the above works consider mixing Miralax with Benefiber and taking both once to twice daily to control the constipation.   Constipation, Adult Constipation is when a person:  Poops (has a bowel movement) fewer times in a week than normal.  Has a hard time pooping.  Has poop that is dry, hard, or bigger than normal. Follow these instructions at home: Eating and drinking   Eat foods that have a lot of fiber, such as: ? Fresh fruits and vegetables. ? Whole grains. ? Beans.  Eat less of foods that are high in fat, low in fiber, or overly processed, such as: ? [place] fries. ? Hamburgers. ? Cookies. ? Candy. ? Soda.  Drink enough fluid to keep your pee (urine) clear or pale yellow. General instructions  Exercise regularly or as told by your doctor.  Go to the restroom when you feel like you need to poop. Do not hold it in.  Take over-the-counter and prescription medicines only as told by your doctor. These include any fiber supplements.  Do pelvic floor retraining exercises, such as: ? Doing deep breathing while relaxing your lower belly (abdomen). ? Relaxing your pelvic floor while pooping.  Watch your condition for any changes.  Keep all follow-up visits as told by your doctor. This is important. Contact a doctor if:  You have pain that gets worse.  You have a fever.  You have not pooped for 4 days.  You throw up (vomit).  You are not hungry.  You lose weight.  You are bleeding from the anus.  You have thin, pencil-like poop (stool). Get help right away if:  You have a fever, and your  symptoms suddenly get worse.  You leak poop or have blood in your poop.  Your belly feels hard or bigger than normal (is bloated).  You have very bad belly pain.  You feel dizzy or you faint. This information is not intended to replace advice given to you by your health care provider. Make sure you discuss any questions you have with your health care provider. Document Released: 11/16/2007 Document Revised: 12/18/2015 Document Reviewed: 11/18/2015 Elsevier Interactive Patient Education  2019 [employer].

## 2018-11-26 ENCOUNTER — Ambulatory Visit (INDEPENDENT_AMBULATORY_CARE_PROVIDER_SITE_OTHER): Payer: Medicare HMO | Admitting: *Deleted

## 2018-11-26 DIAGNOSIS — I495 Sick sinus syndrome: Secondary | ICD-10-CM

## 2018-11-27 ENCOUNTER — Telehealth: Payer: Self-pay

## 2018-11-27 LAB — CUP PACEART REMOTE DEVICE CHECK
Battery Remaining Longevity: 144 mo
Battery Remaining Percentage: 100 %
Brady Statistic RA Percent Paced: 76 %
Brady Statistic RV Percent Paced: 7 %
Date Time Interrogation Session: 20200615065100
Implantable Lead Implant Date: 20191203
Implantable Lead Implant Date: 20191203
Implantable Lead Location: 753859
Implantable Lead Location: 753860
Implantable Lead Model: 7740
Implantable Lead Model: 7741
Implantable Lead Serial Number: 1095486
Implantable Lead Serial Number: 711800
Implantable Pulse Generator Implant Date: 20191203
Lead Channel Impedance Value: 640 Ohm
Lead Channel Impedance Value: 669 Ohm
Lead Channel Pacing Threshold Amplitude: 0.4 V
Lead Channel Pacing Threshold Amplitude: 1 V
Lead Channel Pacing Threshold Pulse Width: 0.4 ms
Lead Channel Pacing Threshold Pulse Width: 0.4 ms
Lead Channel Setting Pacing Amplitude: 3.5 V
Lead Channel Setting Pacing Amplitude: 3.5 V
Lead Channel Setting Pacing Pulse Width: 0.4 ms
Lead Channel Setting Sensing Sensitivity: 2.5 mV
Pulse Gen Serial Number: 838922

## 2018-11-27 NOTE — Telephone Encounter (Signed)
Left message for patient to remind of missed remote transmission.  

## 2018-12-04 ENCOUNTER — Encounter: Payer: Self-pay | Admitting: Cardiology

## 2018-12-04 NOTE — Progress Notes (Signed)
Remote pacemaker transmission.   

## 2018-12-06 ENCOUNTER — Other Ambulatory Visit: Payer: Self-pay

## 2018-12-06 ENCOUNTER — Other Ambulatory Visit: Payer: Self-pay | Admitting: Family Medicine

## 2018-12-06 ENCOUNTER — Other Ambulatory Visit (INDEPENDENT_AMBULATORY_CARE_PROVIDER_SITE_OTHER): Payer: Medicare HMO

## 2018-12-06 DIAGNOSIS — E782 Mixed hyperlipidemia: Secondary | ICD-10-CM

## 2018-12-06 DIAGNOSIS — R351 Nocturia: Secondary | ICD-10-CM

## 2018-12-06 DIAGNOSIS — R739 Hyperglycemia, unspecified: Secondary | ICD-10-CM | POA: Diagnosis not present

## 2018-12-06 DIAGNOSIS — I2510 Atherosclerotic heart disease of native coronary artery without angina pectoris: Secondary | ICD-10-CM | POA: Diagnosis not present

## 2018-12-06 DIAGNOSIS — D649 Anemia, unspecified: Secondary | ICD-10-CM | POA: Diagnosis not present

## 2018-12-06 LAB — COMPREHENSIVE METABOLIC PANEL
ALT: 23 U/L (ref 0–53)
AST: 20 U/L (ref 0–37)
Albumin: 4 g/dL (ref 3.5–5.2)
Alkaline Phosphatase: 56 U/L (ref 39–117)
BUN: 17 mg/dL (ref 6–23)
CO2: 27 mEq/L (ref 19–32)
Calcium: 8.7 mg/dL (ref 8.4–10.5)
Chloride: 108 mEq/L (ref 96–112)
Creatinine, Ser: 1.01 mg/dL (ref 0.40–1.50)
GFR: 72.62 mL/min (ref >60.00)
Glucose, Bld: 98 mg/dL (ref 70–99)
Potassium: 4.2 mEq/L (ref 3.5–5.1)
Sodium: 141 mEq/L (ref 135–145)
Total Bilirubin: 0.6 mg/dL (ref 0.2–1.2)
Total Protein: 6.6 g/dL (ref 6.0–8.3)

## 2018-12-06 LAB — CBC
HCT: 37.5 % — ABNORMAL LOW (ref 39.0–52.0)
Hemoglobin: 12.9 g/dL — ABNORMAL LOW (ref 13.0–17.0)
MCHC: 34.4 g/dL (ref 30.0–36.0)
MCV: 83.3 fl (ref 78.0–100.0)
Platelets: 209 10*3/uL (ref 150.0–400.0)
RBC: 4.5 Mil/uL (ref 4.22–5.81)
RDW: 13.8 % (ref 11.5–15.5)
WBC: 5.1 10*3/uL (ref 4.0–10.5)

## 2018-12-06 LAB — LIPID PANEL
Cholesterol: 137 mg/dL (ref 0–200)
HDL: 44 mg/dL (ref >39.00)
LDL Cholesterol: 77 mg/dL (ref 0–99)
NonHDL: 92.72
Total CHOL/HDL Ratio: 3
Triglycerides: 81 mg/dL (ref 0.0–149.0)
VLDL: 16.2 mg/dL (ref 0.0–40.0)

## 2018-12-06 LAB — TSH: TSH: 1.58 u[IU]/mL (ref 0.35–4.50)

## 2018-12-06 LAB — HEMOGLOBIN A1C: Hgb A1c MFr Bld: 5.8 % (ref 4.6–6.5)

## 2018-12-06 LAB — PSA: PSA: 0.59 ng/mL (ref 0.10–4.00)

## 2018-12-07 NOTE — Addendum Note (Signed)
Addended by: Magdalene Molly A on: 12/07/2018 09:32 AM   Modules accepted: Orders

## 2018-12-10 ENCOUNTER — Encounter: Payer: Medicare HMO | Admitting: Family Medicine

## 2018-12-27 ENCOUNTER — Other Ambulatory Visit: Payer: Self-pay | Admitting: Family Medicine

## 2018-12-27 NOTE — Telephone Encounter (Signed)
Please advise on lipitor dosage.

## 2018-12-27 NOTE — Telephone Encounter (Signed)
Medication Refill - Medication: atorvastatin (LIPITOR) 20 MG tablet / Pt stated the pharmacy told him they could not fill rx because he needed to schedule OV. Pt scheduled for 01/21/19. Please advise.  Has the patient contacted their pharmacy? Yes.   (Agent: If no, request that the patient contact the pharmacy for the refill.) (Agent: If yes, when and what did the pharmacy advise?)  Preferred Pharmacy (with phone number or street name):  [id number] - [place],  - [address] [phone] (Phone) [phone] (Fax)     Agent: Please be advised that RX refills may take up to 3 business days. We ask that you follow-up with your pharmacy.

## 2018-12-28 MED ORDER — ATORVASTATIN CALCIUM 20 MG PO TABS: 20.0000 mg | ORAL_TABLET | Freq: Every day | ORAL | 0 refills | Status: DC

## 2019-01-01 ENCOUNTER — Telehealth: Payer: Self-pay | Admitting: Family Medicine

## 2019-01-01 NOTE — Telephone Encounter (Signed)
Pt has a colonoscopy sch for end of dec 2020 at doctor in [place] and he would like to know if he should still do fecal immuno chemical test

## 2019-01-02 NOTE — Telephone Encounter (Signed)
Please advise 

## 2019-01-02 NOTE — Telephone Encounter (Signed)
I am glad he has a colonoscopy later this year but if he is actually bleeding and that is what is causing his anemia. Then he might need to move up his colonoscopy sooner so he should proceed with iFOB

## 2019-01-03 NOTE — Telephone Encounter (Signed)
Spoke with patient he stated he will do the IFOB cards

## 2019-01-21 ENCOUNTER — Encounter: Payer: Self-pay | Admitting: Family Medicine

## 2019-01-21 ENCOUNTER — Other Ambulatory Visit: Payer: Self-pay

## 2019-01-21 ENCOUNTER — Ambulatory Visit (INDEPENDENT_AMBULATORY_CARE_PROVIDER_SITE_OTHER): Payer: Medicare HMO | Admitting: Family Medicine

## 2019-01-21 VITALS — BP 120/86 | HR 60 | Temp 98.2°F | Resp 18 | Ht 66.0 in | Wt 189.2 lb

## 2019-01-21 DIAGNOSIS — R252 Cramp and spasm: Secondary | ICD-10-CM

## 2019-01-21 DIAGNOSIS — Z0000 Encounter for general adult medical examination without abnormal findings: Secondary | ICD-10-CM | POA: Diagnosis not present

## 2019-01-21 DIAGNOSIS — E782 Mixed hyperlipidemia: Secondary | ICD-10-CM | POA: Diagnosis not present

## 2019-01-21 DIAGNOSIS — R739 Hyperglycemia, unspecified: Secondary | ICD-10-CM | POA: Diagnosis not present

## 2019-01-21 DIAGNOSIS — D649 Anemia, unspecified: Secondary | ICD-10-CM

## 2019-01-21 DIAGNOSIS — M25551 Pain in right hip: Secondary | ICD-10-CM | POA: Diagnosis not present

## 2019-01-21 LAB — CBC
HCT: 39.6 % (ref 39.0–52.0)
Hemoglobin: 13.7 g/dL (ref 13.0–17.0)
MCHC: 34.5 g/dL (ref 30.0–36.0)
MCV: 83.6 fl (ref 78.0–100.0)
Platelets: 207 10*3/uL (ref 150.0–400.0)
RBC: 4.74 Mil/uL (ref 4.22–5.81)
RDW: 13.9 % (ref 11.5–15.5)
WBC: 5.8 10*3/uL (ref 4.0–10.5)

## 2019-01-21 LAB — FERRITIN: Ferritin: 347.5 ng/mL — ABNORMAL HIGH (ref 22.0–322.0)

## 2019-01-21 NOTE — Assessment & Plan Note (Signed)
hgba1c acceptable, minimize simple carbs. Increase exercise as tolerated.  

## 2019-01-21 NOTE — Assessment & Plan Note (Signed)
Encouraged heart healthy diet, increase exercise, avoid trans fats, consider a krill oil cap daily 

## 2019-01-21 NOTE — Assessment & Plan Note (Signed)
Patient encouraged to maintain heart healthy diet, regular exercise, adequate sleep. Consider daily probiotics. Take medications as prescribed 

## 2019-01-21 NOTE — Patient Instructions (Addendum)
Omron blood pressure cuff, upper arm Pulse oximeter  Vitals weekly  Vitamin C 500 mg po bid Zinc 30-50 mg daily  Badger Headache stick, [email or web address]    Preventive Care 15 Years and Older, Male Preventive care refers to lifestyle choices and visits with your health care provider that can promote health and wellness. This includes:  A yearly physical exam. This is also called an annual well check.  Regular dental and eye exams.  Immunizations.  Screening for certain conditions.  Healthy lifestyle choices, such as diet and exercise. What can I expect for my preventive care visit? Physical exam Your health care provider will check:  Height and weight. These may be used to calculate body mass index (BMI), which is a measurement that tells if you are at a healthy weight.  Heart rate and blood pressure.  Your skin for abnormal spots. Counseling Your health care provider may ask you questions about:  Alcohol, tobacco, and drug use.  Emotional well-being.  Home and relationship well-being.  Sexual activity.  Eating habits.  History of falls.  Memory and ability to understand (cognition).  Work and work Statistician. What immunizations do I need?  Influenza (flu) vaccine  This is recommended every year. Tetanus, diphtheria, and pertussis (Tdap) vaccine  You may need a Td booster every 10 years. Varicella (chickenpox) vaccine  You may need this vaccine if you have not already been vaccinated. Zoster (shingles) vaccine  You may need this after age 14. Pneumococcal conjugate (PCV13) vaccine  One dose is recommended after age 81. Pneumococcal polysaccharide (PPSV23) vaccine  One dose is recommended after age 59. Measles, mumps, and rubella (MMR) vaccine  You may need at least one dose of MMR if you were born in 1957 or later. You may also need a second dose. Meningococcal conjugate (MenACWY) vaccine  You may need this if you have certain conditions.  Hepatitis A vaccine  You may need this if you have certain conditions or if you travel or work in places where you may be exposed to hepatitis A. Hepatitis B vaccine  You may need this if you have certain conditions or if you travel or work in places where you may be exposed to hepatitis B. Haemophilus influenzae type b (Hib) vaccine  You may need this if you have certain conditions. You may receive vaccines as individual doses or as more than one vaccine together in one shot (combination vaccines). Talk with your health care provider about the risks and benefits of combination vaccines. What tests do I need? Blood tests  Lipid and cholesterol levels. These may be checked every 5 years, or more frequently depending on your overall health.  Hepatitis C test.  Hepatitis B test. Screening  Lung cancer screening. You may have this screening every year starting at age 77 if you have a 30-pack-year history of smoking and currently smoke or have quit within the past 15 years.  Colorectal cancer screening. All adults should have this screening starting at age 27 and continuing until age 65. Your health care provider may recommend screening at age 59 if you are at increased risk. You will have tests every 1-10 years, depending on your results and the type of screening test.  Prostate cancer screening. Recommendations will vary depending on your family history and other risks.  Diabetes screening. This is done by checking your blood sugar (glucose) after you have not eaten for a while (fasting). You may have this done every 1-3 years.  Abdominal aortic aneurysm (  AAA) screening. You may need this if you are a current or former smoker.  Sexually transmitted disease (STD) testing. Follow these instructions at home: Eating and drinking  Eat a diet that includes fresh fruits and vegetables, whole grains, lean protein, and low-fat dairy products. Limit your intake of foods with high amounts of  sugar, saturated fats, and salt.  Take vitamin and mineral supplements as recommended by your health care provider.  Do not drink alcohol if your health care provider tells you not to drink.  If you drink alcohol: ? Limit how much you have to 0-2 drinks a day. ? Be aware of how much alcohol is in your drink. In the [place] one drink equals one 12 oz bottle of beer (355 mL), one 5 oz glass of wine (148 mL), or one 1 oz glass of hard liquor (44 mL). Lifestyle  Take daily care of your teeth and gums.  Stay active. Exercise for at least 30 minutes on 5 or more days each week.  Do not use any products that contain nicotine or tobacco, such as cigarettes, e-cigarettes, and chewing tobacco. If you need help quitting, ask your health care provider.  If you are sexually active, practice safe sex. Use a condom or other form of protection to prevent STIs (sexually transmitted infections).  Talk with your health care provider about taking a low-dose aspirin or statin. What's next?  Visit your health care provider once a year for a well check visit.  Ask your health care provider how often you should have your eyes and teeth checked.  Stay up to date on all vaccines. This information is not intended to replace advice given to you by your health care provider. Make sure you discuss any questions you have with your health care provider. Document Released: 06/26/2015 Document Revised: 05/24/2018 Document Reviewed: 05/24/2018 Elsevier Patient Education  2020 [employer].

## 2019-01-22 DIAGNOSIS — H25013 Cortical age-related cataract, bilateral: Secondary | ICD-10-CM | POA: Insufficient documentation

## 2019-01-22 DIAGNOSIS — H04123 Dry eye syndrome of bilateral lacrimal glands: Secondary | ICD-10-CM | POA: Insufficient documentation

## 2019-01-22 DIAGNOSIS — H53022 Refractive amblyopia, left eye: Secondary | ICD-10-CM | POA: Diagnosis not present

## 2019-01-22 DIAGNOSIS — H43811 Vitreous degeneration, right eye: Secondary | ICD-10-CM | POA: Insufficient documentation

## 2019-01-22 DIAGNOSIS — H40003 Preglaucoma, unspecified, bilateral: Secondary | ICD-10-CM | POA: Insufficient documentation

## 2019-01-22 DIAGNOSIS — H2513 Age-related nuclear cataract, bilateral: Secondary | ICD-10-CM | POA: Diagnosis not present

## 2019-01-22 DIAGNOSIS — H02831 Dermatochalasis of right upper eyelid: Secondary | ICD-10-CM | POA: Insufficient documentation

## 2019-01-22 DIAGNOSIS — H02834 Dermatochalasis of left upper eyelid: Secondary | ICD-10-CM | POA: Diagnosis not present

## 2019-01-22 DIAGNOSIS — H43392 Other vitreous opacities, left eye: Secondary | ICD-10-CM | POA: Diagnosis not present

## 2019-01-22 DIAGNOSIS — H35373 Puckering of macula, bilateral: Secondary | ICD-10-CM | POA: Diagnosis not present

## 2019-01-23 DIAGNOSIS — M25551 Pain in right hip: Secondary | ICD-10-CM | POA: Insufficient documentation

## 2019-01-23 NOTE — Assessment & Plan Note (Signed)
Posterior and lateral. Improving.  Encouraged moist heat and gentle stretching as tolerated. May try NSAIDs and prescription meds as directed and report if symptoms worsen or seek immediate care

## 2019-01-23 NOTE — Progress Notes (Signed)
Subjective:    Patient ID: Daniel Reyes, male    DOB: 17-Apr-1946, 73 y.o.   MRN: [id number]  No chief complaint on file.   HPI Patient is in today for follow up on chronic medical concerns including hyperlipidemia, hyperglycemia and more. No polyuria or polydipsia. No recent febrile illness or hospitalizations. He has been under a great deal of stress as he has had 2 cousins die recently from cancer. He is also noting right posterior hip pain but it does not radiate down the leg. No fall or trauma. No incontinence. Denies CP/palp/SOB/HA/congestion/fevers/GI or GU c/o. Taking meds as prescribed  Past Medical History:  Diagnosis Date  . History of chicken pox   . Hyperlipemia   . Mild CAD    a. cath 02/09/17 - Mild to moderate proximal and mid LAD plaque with less than 40% narrowing  . Nocturnal hypoxia     Past Surgical History:  Procedure Laterality Date  . BACK SURGERY  1980  . CHOLECYSTECTOMY OPEN  1998  . gerd surgery    . NECK SURGERY  1983  . PACEMAKER IMPLANT N/A 05/15/2018   Procedure: PACEMAKER IMPLANT - daul chamber;  Surgeon: Evans Lance, MD;  Location: [facility];  Service: Cardiovascular;  Laterality: N/A;  . RIGHT/LEFT HEART CATH AND CORONARY ANGIOGRAPHY N/A 02/09/2017   Procedure: RIGHT/LEFT HEART CATH AND CORONARY ANGIOGRAPHY;  Surgeon: Belva Crome, MD;  Location: [facility];  Service: Cardiovascular;  Laterality: N/A;  . TONSILLECTOMY AND ADENOIDECTOMY      Family History  Problem Relation Age of Onset  . Heart attack Father   . Colon cancer Father   . Cancer Father   . Alzheimer's disease Mother   . Stroke Brother   . Heart disease Brother        afib  . Stroke Maternal Grandmother   . Mental illness Maternal Grandfather        suicide  . Alzheimer's disease Paternal Grandmother   . Peripheral vascular disease Paternal Grandfather   . Alcohol abuse Neg Hx     Social History   Socioeconomic History  . Marital status: Married    Spouse name: Not on file  . Number of children: 3  . Years of education: Not on file  . Highest education level: Not on file  Occupational History  . Occupation: retired-owner of [employer]  . Financial resource strain: Not on file  . Food insecurity    Worry: Not on file    Inability: Not on file  . Transportation needs    Medical: Not on file    Non-medical: Not on file  Tobacco Use  . Smoking status: Never Smoker  . Smokeless tobacco: Never Used  Substance and Sexual Activity  . Alcohol use: Yes    Comment: very rarely  . Drug use: No  . Sexual activity: Not Currently  Lifestyle  . Physical activity    Days per week: Not on file    Minutes per session: Not on file  . Stress: Not on file  Relationships  . Social Herbalist on phone: Not on file    Gets together: Not on file    Attends religious service: Not on file    Active member of club or organization: Not on file    Attends meetings of clubs or organizations: Not on file    Relationship status: Not on file  . Intimate partner  violence    Fear of current or ex partner: Not on file    Emotionally abused: Not on file    Physically abused: Not on file    Forced sexual activity: Not on file  Other Topics Concern  . Not on file  Social History Narrative   Lives with wife, retired Pharmacist, community, no major dietary restrictions, negative cigarettes. Rare alcohol, wears seat belt most of the time    Outpatient Medications Prior to Visit  Medication Sig Dispense Refill  . albuterol (PROVENTIL HFA;VENTOLIN HFA) 108 (90 Base) MCG/ACT inhaler Inhale 2 puffs into the lungs every 6 (six) hours as needed for wheezing or shortness of breath. 1 Inhaler 2  . atorvastatin (LIPITOR) 20 MG tablet Take 1 tablet (20 mg total) by mouth daily. 90 tablet 0  . BREO ELLIPTA 100-25 MCG/INH AEPB TAKE 1 PUFF BY MOUTH EVERY DAY 180 each 3  . Multiple Vitamin (MULTIVITAMIN WITH MINERALS) TABS  tablet Take 1 tablet by mouth daily.    Marland Kitchen nystatin (MYCOSTATIN) 100000 UNIT/ML suspension Swish and swallow 5 ml in mouth twice daily for 7 days 60 mL 0   No facility-administered medications prior to visit.     Allergies  Allergen Reactions  . Imdur [Isosorbide Dinitrate] Anaphylaxis and Nausea And Vomiting    Nose swelling with difficulty breathing thru nose    Review of Systems  Constitutional: Negative for fever and malaise/fatigue.  HENT: Negative for congestion.   Eyes: Negative for blurred vision.  Respiratory: Negative for shortness of breath.   Cardiovascular: Negative for chest pain, palpitations and leg swelling.  Gastrointestinal: Negative for abdominal pain, blood in stool and nausea.  Genitourinary: Negative for dysuria and frequency.  Musculoskeletal: Positive for joint pain. Negative for falls.  Skin: Negative for rash.  Neurological: Negative for dizziness, loss of consciousness and headaches.  Endo/Heme/Allergies: Negative for environmental allergies.  Psychiatric/Behavioral: Negative for depression. The patient is not nervous/anxious.        Objective:    Physical Exam Vitals signs and nursing note reviewed.  Constitutional:      General: He is not in acute distress.    Appearance: He is well-developed.  HENT:     Head: Normocephalic and atraumatic.     Nose: Nose normal.  Eyes:     General:        Right eye: No discharge.        Left eye: No discharge.  Neck:     Musculoskeletal: Normal range of motion and neck supple.  Cardiovascular:     Rate and Rhythm: Normal rate and regular rhythm.     Heart sounds: No murmur.  Pulmonary:     Effort: Pulmonary effort is normal.     Breath sounds: Normal breath sounds.  Abdominal:     General: Bowel sounds are normal.     Palpations: Abdomen is soft.     Tenderness: There is no abdominal tenderness.  Skin:    General: Skin is warm and dry.  Neurological:     Mental Status: He is alert and oriented to  person, place, and time.     BP 120/86 (BP Location: Left Arm, Patient Position: Sitting, Cuff Size: Normal)   Pulse 60   Temp 98.2 F (36.8 C) (Oral)   Resp 18   Ht 5\' 6"  (1.676 m)   Wt 189 lb 3.2 oz (85.8 kg)   SpO2 98%   BMI 30.54 kg/m  Wt Readings from Last 3 Encounters:  01/21/19 189  lb 3.2 oz (85.8 kg)  08/22/18 197 lb 12.8 oz (89.7 kg)  06/01/18 200 lb (90.7 kg)    Diabetic Foot Exam - Simple   No data filed     Lab Results  Component Value Date   WBC 5.8 01/21/2019   HGB 13.7 01/21/2019   HCT 39.6 01/21/2019   PLT 207.0 01/21/2019   GLUCOSE 98 12/06/2018   CHOL 137 12/06/2018   TRIG 81.0 12/06/2018   HDL 44.00 12/06/2018   LDLCALC 77 12/06/2018   ALT 23 12/06/2018   AST 20 12/06/2018   NA 141 12/06/2018   K 4.2 12/06/2018   CL 108 12/06/2018   CREATININE 1.01 12/06/2018   BUN 17 12/06/2018   CO2 27 12/06/2018   TSH 1.58 12/06/2018   PSA 0.59 12/06/2018   INR 1.05 02/08/2017   HGBA1C 5.8 12/06/2018    Lab Results  Component Value Date   TSH 1.58 12/06/2018   Lab Results  Component Value Date   WBC 5.8 01/21/2019   HGB 13.7 01/21/2019   HCT 39.6 01/21/2019   MCV 83.6 01/21/2019   PLT 207.0 01/21/2019   Lab Results  Component Value Date   NA 141 12/06/2018   K 4.2 12/06/2018   CO2 27 12/06/2018   GLUCOSE 98 12/06/2018   BUN 17 12/06/2018   CREATININE 1.01 12/06/2018   BILITOT 0.6 12/06/2018   ALKPHOS 56 12/06/2018   AST 20 12/06/2018   ALT 23 12/06/2018   PROT 6.6 12/06/2018   ALBUMIN 4.0 12/06/2018   CALCIUM 8.7 12/06/2018   ANIONGAP 6 02/10/2017   GFR 72.62 12/06/2018   Lab Results  Component Value Date   CHOL 137 12/06/2018   Lab Results  Component Value Date   HDL 44.00 12/06/2018   Lab Results  Component Value Date   LDLCALC 77 12/06/2018   Lab Results  Component Value Date   TRIG 81.0 12/06/2018   Lab Results  Component Value Date   CHOLHDL 3 12/06/2018   Lab Results  Component Value Date   HGBA1C 5.8  12/06/2018       Assessment & Plan:   Problem List Items Addressed This Visit    Hyperlipidemia    Encouraged heart healthy diet, increase exercise, avoid trans fats, consider a krill oil cap daily      Routine general medical examination at a health care facility    Patient encouraged to maintain heart healthy diet, regular exercise, adequate sleep. Consider daily probiotics. Take medications as prescribed      Hyperglycemia    hgba1c acceptable, minimize simple carbs. Increase exercise as tolerated.       Anemia - Primary   Relevant Orders   CBC (Completed)   Ferritin (Completed)   Muscle cramps    Worse recently, hydrating well. Check labs and continue to hydrate well         I am having Everlene Other. Claassen maintain his albuterol, multivitamin with minerals, nystatin, Breo Ellipta, and atorvastatin.  No orders of the defined types were placed in this encounter.    Penni Homans, MD

## 2019-01-23 NOTE — Assessment & Plan Note (Signed)
Worse recently, hydrating well. Check labs and continue to hydrate well

## 2019-02-14 ENCOUNTER — Other Ambulatory Visit: Payer: Self-pay

## 2019-02-14 ENCOUNTER — Ambulatory Visit: Payer: Medicare HMO | Admitting: Acute Care

## 2019-02-25 ENCOUNTER — Ambulatory Visit (INDEPENDENT_AMBULATORY_CARE_PROVIDER_SITE_OTHER): Payer: Medicare HMO | Admitting: *Deleted

## 2019-02-25 DIAGNOSIS — I4510 Unspecified right bundle-branch block: Secondary | ICD-10-CM

## 2019-02-25 DIAGNOSIS — I495 Sick sinus syndrome: Secondary | ICD-10-CM | POA: Diagnosis not present

## 2019-02-26 LAB — CUP PACEART REMOTE DEVICE CHECK
Battery Remaining Longevity: 150 mo
Battery Remaining Percentage: 100 %
Brady Statistic RA Percent Paced: 78 %
Brady Statistic RV Percent Paced: 7 %
Date Time Interrogation Session: 20200914065000
Implantable Lead Implant Date: 20191203
Implantable Lead Implant Date: 20191203
Implantable Lead Location: 753859
Implantable Lead Location: 753860
Implantable Lead Model: 7740
Implantable Lead Model: 7741
Implantable Lead Serial Number: 1095486
Implantable Lead Serial Number: 711800
Implantable Pulse Generator Implant Date: 20191203
Lead Channel Impedance Value: 626 Ohm
Lead Channel Impedance Value: 693 Ohm
Lead Channel Pacing Threshold Amplitude: 0.4 V
Lead Channel Pacing Threshold Amplitude: 1.1 V
Lead Channel Pacing Threshold Pulse Width: 0.4 ms
Lead Channel Pacing Threshold Pulse Width: 0.4 ms
Lead Channel Setting Pacing Amplitude: 3.5 V
Lead Channel Setting Pacing Amplitude: 3.5 V
Lead Channel Setting Pacing Pulse Width: 0.4 ms
Lead Channel Setting Sensing Sensitivity: 2.5 mV
Pulse Gen Serial Number: 838922

## 2019-03-04 DIAGNOSIS — D225 Melanocytic nevi of trunk: Secondary | ICD-10-CM | POA: Diagnosis not present

## 2019-03-04 DIAGNOSIS — L821 Other seborrheic keratosis: Secondary | ICD-10-CM | POA: Diagnosis not present

## 2019-03-04 DIAGNOSIS — L570 Actinic keratosis: Secondary | ICD-10-CM | POA: Diagnosis not present

## 2019-03-11 NOTE — Progress Notes (Signed)
Remote pacemaker transmission.   

## 2019-04-27 ENCOUNTER — Other Ambulatory Visit: Payer: Self-pay | Admitting: Family Medicine

## 2019-05-27 ENCOUNTER — Ambulatory Visit (INDEPENDENT_AMBULATORY_CARE_PROVIDER_SITE_OTHER): Payer: Medicare HMO | Admitting: *Deleted

## 2019-05-27 DIAGNOSIS — I495 Sick sinus syndrome: Secondary | ICD-10-CM | POA: Diagnosis not present

## 2019-05-28 LAB — CUP PACEART REMOTE DEVICE CHECK
Battery Remaining Longevity: 144 mo
Battery Remaining Percentage: 100 %
Brady Statistic RA Percent Paced: 76 %
Brady Statistic RV Percent Paced: 6 %
Date Time Interrogation Session: 20201214025100
Implantable Lead Implant Date: 20191203
Implantable Lead Implant Date: 20191203
Implantable Lead Location: 753859
Implantable Lead Location: 753860
Implantable Lead Model: 7740
Implantable Lead Model: 7741
Implantable Lead Serial Number: 1095486
Implantable Lead Serial Number: 711800
Implantable Pulse Generator Implant Date: 20191203
Lead Channel Impedance Value: 597 Ohm
Lead Channel Impedance Value: 644 Ohm
Lead Channel Pacing Threshold Amplitude: 0.4 V
Lead Channel Pacing Threshold Amplitude: 1.3 V
Lead Channel Pacing Threshold Pulse Width: 0.4 ms
Lead Channel Pacing Threshold Pulse Width: 0.4 ms
Lead Channel Setting Pacing Amplitude: 3.5 V
Lead Channel Setting Pacing Amplitude: 3.5 V
Lead Channel Setting Pacing Pulse Width: 0.4 ms
Lead Channel Setting Sensing Sensitivity: 2.5 mV
Pulse Gen Serial Number: 838922

## 2019-05-30 ENCOUNTER — Telehealth: Payer: Self-pay | Admitting: Family Medicine

## 2019-05-30 DIAGNOSIS — R69 Illness, unspecified: Secondary | ICD-10-CM | POA: Diagnosis not present

## 2019-05-30 NOTE — Telephone Encounter (Signed)
Attempted to call patient to schedule Annual Wellness Visit, but patient did not answer. Will try to call patient again at a later time. SF °

## 2019-07-23 ENCOUNTER — Other Ambulatory Visit: Payer: Self-pay | Admitting: Family Medicine

## 2019-08-26 ENCOUNTER — Ambulatory Visit (INDEPENDENT_AMBULATORY_CARE_PROVIDER_SITE_OTHER): Payer: Medicare HMO | Admitting: *Deleted

## 2019-08-26 DIAGNOSIS — H25013 Cortical age-related cataract, bilateral: Secondary | ICD-10-CM | POA: Diagnosis not present

## 2019-08-26 DIAGNOSIS — Z83518 Family history of other specified eye disorder: Secondary | ICD-10-CM | POA: Diagnosis not present

## 2019-08-26 DIAGNOSIS — Z83511 Family history of glaucoma: Secondary | ICD-10-CM | POA: Diagnosis not present

## 2019-08-26 DIAGNOSIS — I495 Sick sinus syndrome: Secondary | ICD-10-CM

## 2019-08-26 DIAGNOSIS — H43393 Other vitreous opacities, bilateral: Secondary | ICD-10-CM | POA: Diagnosis not present

## 2019-08-26 DIAGNOSIS — H2513 Age-related nuclear cataract, bilateral: Secondary | ICD-10-CM | POA: Diagnosis not present

## 2019-08-26 DIAGNOSIS — Z8679 Personal history of other diseases of the circulatory system: Secondary | ICD-10-CM | POA: Diagnosis not present

## 2019-08-26 DIAGNOSIS — H35373 Puckering of macula, bilateral: Secondary | ICD-10-CM | POA: Diagnosis not present

## 2019-08-26 DIAGNOSIS — H43811 Vitreous degeneration, right eye: Secondary | ICD-10-CM | POA: Diagnosis not present

## 2019-08-26 DIAGNOSIS — H40003 Preglaucoma, unspecified, bilateral: Secondary | ICD-10-CM | POA: Diagnosis not present

## 2019-08-27 LAB — CUP PACEART REMOTE DEVICE CHECK
Battery Remaining Longevity: 144 mo
Battery Remaining Percentage: 100 %
Brady Statistic RA Percent Paced: 76 %
Brady Statistic RV Percent Paced: 5 %
Date Time Interrogation Session: 20210315025100
Implantable Lead Implant Date: 20191203
Implantable Lead Implant Date: 20191203
Implantable Lead Location: 753859
Implantable Lead Location: 753860
Implantable Lead Model: 7740
Implantable Lead Model: 7741
Implantable Lead Serial Number: 1095486
Implantable Lead Serial Number: 711800
Implantable Pulse Generator Implant Date: 20191203
Lead Channel Impedance Value: 600 Ohm
Lead Channel Impedance Value: 654 Ohm
Lead Channel Pacing Threshold Amplitude: 0.4 V
Lead Channel Pacing Threshold Amplitude: 1.3 V
Lead Channel Pacing Threshold Pulse Width: 0.4 ms
Lead Channel Pacing Threshold Pulse Width: 0.4 ms
Lead Channel Setting Pacing Amplitude: 3.5 V
Lead Channel Setting Pacing Amplitude: 3.5 V
Lead Channel Setting Pacing Pulse Width: 0.4 ms
Lead Channel Setting Sensing Sensitivity: 2.5 mV
Pulse Gen Serial Number: 838922

## 2019-08-27 NOTE — Progress Notes (Signed)
PPM Remote  

## 2019-10-08 ENCOUNTER — Other Ambulatory Visit: Payer: Self-pay

## 2019-10-08 ENCOUNTER — Ambulatory Visit (INDEPENDENT_AMBULATORY_CARE_PROVIDER_SITE_OTHER): Payer: Medicare HMO | Admitting: Primary Care

## 2019-10-08 ENCOUNTER — Encounter: Payer: Self-pay | Admitting: Primary Care

## 2019-10-08 DIAGNOSIS — R42 Dizziness and giddiness: Secondary | ICD-10-CM

## 2019-10-08 DIAGNOSIS — G4730 Sleep apnea, unspecified: Secondary | ICD-10-CM

## 2019-10-08 NOTE — Progress Notes (Signed)
Virtual Visit via Telephone Note  I connected with Daniel Reyes on 10/08/19 at  9:30 AM EDT by telephone and verified that I am speaking with the correct person using two identifiers.  Location: Patient: Home Provider: Office   I discussed the limitations, risks, security and privacy concerns of performing an evaluation and management service by telephone and the availability of in person appointments. I also discussed with the patient that there may be a patient responsible charge related to this service. The patient expressed understanding and agreed to proceed.   History of Present Illness: 74 year old male, never smoked. PMH significant for mild intermittent asthma, OSA, RBBB, mild CVA, anemia. Patient of Dr. Elsworth Soho. Maintained on Breo 100, and prn albuterol hfa.   10/08/2019 Patient contacted today for televisit. He reports dizziness and fatigue for the last 4-5 years. Feels symptoms have becomes more frequent and extreme. He is active. He does experiences intermittent dizziness and will need to sit down and his symptoms will continued for 45 mins. He has some associated vision changes. He does have a Psychologist, forensic. He is not currently wearing CPAP. [facility] showed that he has not worn it in 6 months. He continues using Breo once daily, has not needed to use his Albuterol rescue inhaler. Denies any new cognitive change, weakness, shortness of breath, wheezing, chest tightness or cough   Observations/Objective:  - Able to speak in full sentences, no shortness of breath or cough  Significant tests/ events reviewed  Nocturnal oximetry8/2018 >>showed 10.9 minutes of oxygen desaturation less than 88% with oxygen desaturation index of 25 over an 8.5 hours of monitoring, his average pulse was 54/m With lowest pulse documented as 47.  Echo 01/2017 did not show any evidence of pulmonary hypertension.Exercise stress test showed a hypotensive response  PFTs showed ratio 69 with FEV1 of 84% and  FVC of 89% with significant broncho-dilator response, TLC was preserved at 55% and DLCO was decreased at 52% corrected for alveolar volume to 62%.  Orthostatics normal. He desaturated to 79% on walking up and down the stairs with heart rate increased to 11 9/m and recovered within 2 minutes. He did not desaturate when walking on level ground  02/2017 CT angio neg PE< neg fibrosis   9/2018Home sleep study showed mild OSA with AHI of 12/hour His desaturation was mild-only 18 minutes with saturation less than 90%   Assessment and Plan:  Dizziness: - Symptoms appears chronic but are becoming more frequent  - Consider vestibular therapy  - Refer to [facility]  OSA: - Increased fatigue - Non-compliant with CPAP > 6 months  - Encouraged patient to resume CPAP, aim wear 4-6 hours or more each night  Mild intermittent asthma: - Stable; continue Breo 100 and prn albuterol hfa   Follow Up Instructions:   - FU in 8 weeks with Dr. Elsworth Soho or APP  I discussed the assessment and treatment plan with the patient. The patient was provided an opportunity to ask questions and all were answered. The patient agreed with the plan and demonstrated an understanding of the instructions.   The patient was advised to call back or seek an in-person evaluation if the symptoms worsen or if the condition fails to improve as anticipated.  I provided 22 minutes of non-face-to-face time during this encounter.   Martyn Ehrich, NP

## 2019-10-08 NOTE — Patient Instructions (Addendum)
Referral: [facility] re: dizziness   Recommendation: Resume CPAP, aim to wear 4-6 hours a night or more   Follow-up: 8 weeks with Dr. Elsworth Soho or APP

## 2019-10-18 ENCOUNTER — Other Ambulatory Visit: Payer: Self-pay | Admitting: Family Medicine

## 2019-10-21 ENCOUNTER — Other Ambulatory Visit: Payer: Self-pay

## 2019-10-21 ENCOUNTER — Telehealth: Payer: Self-pay | Admitting: Neurology

## 2019-10-21 ENCOUNTER — Ambulatory Visit: Payer: Medicare HMO | Admitting: Neurology

## 2019-10-21 ENCOUNTER — Encounter: Payer: Self-pay | Admitting: Neurology

## 2019-10-21 VITALS — BP 109/71 | HR 64 | Ht 67.0 in | Wt 197.0 lb

## 2019-10-21 DIAGNOSIS — R7989 Other specified abnormal findings of blood chemistry: Secondary | ICD-10-CM | POA: Diagnosis not present

## 2019-10-21 DIAGNOSIS — R5383 Other fatigue: Secondary | ICD-10-CM | POA: Diagnosis not present

## 2019-10-21 DIAGNOSIS — R292 Abnormal reflex: Secondary | ICD-10-CM | POA: Diagnosis not present

## 2019-10-21 DIAGNOSIS — R799 Abnormal finding of blood chemistry, unspecified: Secondary | ICD-10-CM | POA: Diagnosis not present

## 2019-10-21 DIAGNOSIS — E538 Deficiency of other specified B group vitamins: Secondary | ICD-10-CM | POA: Diagnosis not present

## 2019-10-21 NOTE — Telephone Encounter (Signed)
[employer] medicare order sent to [facility]. They will obtain the auth and reach out to the pt to schedule.

## 2019-10-21 NOTE — Progress Notes (Addendum)
PATIENT: Daniel Reyes DOB: [birth year]  Chief Complaint  Patient presents with  . New Patient (Initial Visit)    New room. PCP: Dr. Penni Homans. Referring: Daniel Pitter, NP Pulmonology. Vision: 20/70 with correction bilaterally  . Dizziness    Lying: 126/79, 64 Sitting: 109/71, 64 Standing:109/68, 64. Dizzy intermittently with fatigue for the past 4-5 years.     HISTORICAL  Daniel Reyes is a 74 year old male, seen in request by his primary care physician Dr. Charlett Blake, Erline Levine A, for evaluation of intermittent episode of generalized fatigue, initial evaluation was on Oct 21, 2019.  I have reviewed and summarized the referring note from the referring physician.  He had a history of hyperlipidemia, had a history of right lumbar radiculopathy, status decompression surgery 1983, which has helped his symptoms, he enjoys flyfishing, active in his life, denies significant difficulty on a regular basis  However since 2017, he has intermittent episode of sudden onset extreme dizziness, fatigue, he was evaluated by cardiologist, during exercise stress test, he was found to have bradycardia, he has to stop exercise during evaluation, cardiac cath showed no significant abnormality.,  He has right bundle branch block, symptomatic bradycardia without reversible cause found, telemetry overnight demonstrated AP/VS, AP/VP, he underwent implantation of a PSX dual-chamber PPM on May 15, 2018 by Dr. Lovena Le,  He was also seen by pulmonologist Dr. Elsworth Soho, pulmonary functional test in September 2018 showed no significant abnormality,  I also reviewed ophthalmology evaluation March 2021 at [facility], epiretinal artery membrane of both eyes, macular, age-related nuclear cataract, expected of both eye  He continue complains of recurrent episode of sudden onset extreme fatigue, it only happened in standing position, often after exertion, he felt woozy headache, had to sit down resting, it never bothered him  when he was in sitting down, or lying down position, he denies chest pain with symptoms, denies focal signs,  He denies bilateral upper or lower extremity paresthesia, denies incontinence  Most recent episode was 2 weeks ago in April 2021, he walked in his backyard for about 10 minutes, when he went into the room, he felt strange, woozy headache, getting worse after he climbed up 12 steps to his upstairs bedroom, he has to hold onto walls to climb up stairs, he has to sat down, but did not pass out, when he tried to use home oximetry, it did not register during the first 2 trials, 5 minutes later, oxygen saturation was 88%, which further improved to 97% after resting for a while,  Laboratory evaluation in August 2020, normal CBC, hemoglobin of 13.7, ferritin 347, A1c of 5.8, lipid panel, LDL 77, normal TSH, PSA,  Optic coherence tomography: Epiretinal membrane, stable,  REVIEW OF SYSTEMS: Full 14 system review of systems performed and notable only for as above All other review of systems were negative.  ALLERGIES: Allergies  Allergen Reactions  . Imdur [Isosorbide Dinitrate] Anaphylaxis and Nausea And Vomiting    Nose swelling with difficulty breathing thru nose    HOME MEDICATIONS: Current Outpatient Medications  Medication Sig Dispense Refill  . albuterol (PROVENTIL HFA;VENTOLIN HFA) 108 (90 Base) MCG/ACT inhaler Inhale 2 puffs into the lungs every 6 (six) hours as needed for wheezing or shortness of breath. 1 Inhaler 2  . atorvastatin (LIPITOR) 20 MG tablet TAKE 1 TABLET BY MOUTH EVERY DAY 90 tablet 0  . BREO ELLIPTA 100-25 MCG/INH AEPB TAKE 1 PUFF BY MOUTH EVERY DAY 180 each 3  . Multiple Vitamin (MULTIVITAMIN WITH MINERALS) TABS  tablet Take 1 tablet by mouth daily.     No current facility-administered medications for this visit.    PAST MEDICAL HISTORY: Past Medical History:  Diagnosis Date  . History of chicken pox   . Hyperlipemia   . Mild CAD    a. cath 02/09/17 - Mild to  moderate proximal and mid LAD plaque with less than 40% narrowing  . Nocturnal hypoxia     PAST SURGICAL HISTORY: Past Surgical History:  Procedure Laterality Date  . BACK SURGERY  1980  . CHOLECYSTECTOMY OPEN  1998  . gerd surgery    . NECK SURGERY  1983  . PACEMAKER IMPLANT N/A 05/15/2018   Procedure: PACEMAKER IMPLANT - daul chamber;  Surgeon: Evans Lance, MD;  Location: [facility];  Service: Cardiovascular;  Laterality: N/A;  . RIGHT/LEFT HEART CATH AND CORONARY ANGIOGRAPHY N/A 02/09/2017   Procedure: RIGHT/LEFT HEART CATH AND CORONARY ANGIOGRAPHY;  Surgeon: Belva Crome, MD;  Location: [facility];  Service: Cardiovascular;  Laterality: N/A;  . TONSILLECTOMY AND ADENOIDECTOMY      FAMILY HISTORY: Family History  Problem Relation Age of Onset  . Heart attack Father   . Colon cancer Father   . Cancer Father   . Alzheimer's disease Mother   . Stroke Brother   . Heart disease Brother        afib  . Stroke Maternal Grandmother   . Mental illness Maternal Grandfather        suicide  . Alzheimer's disease Paternal Grandmother   . Peripheral vascular disease Paternal Grandfather   . Alcohol abuse Neg Hx     SOCIAL HISTORY: Social History   Socioeconomic History  . Marital status: Married    Spouse name: Not on file  . Number of children: 3  . Years of education: Not on file  . Highest education level: Not on file  Occupational History  . 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 Smoking status: Never Smoker  . Smokeless tobacco: Never Used  Substance and Sexual Activity  . Alcohol use: Yes    Comment: very rarely  . Drug use: No  . Sexual activity: Not Currently  Other Topics Concern  . Not on file  Social History Narrative   Lives with wife, retired Pharmacist, community, no major dietary restrictions, negative cigarettes. Rare alcohol, wears seat belt most of the time   Social Determinants of Health    Financial Resource Strain:   . Difficulty of Paying Living Expenses:   Food Insecurity:   . Worried About Charity fundraiser in the Last Year:   . Arboriculturist in the Last Year:   Transportation Needs:   . Film/video editor (Medical):   Marland Kitchen Lack of Transportation (Non-Medical):   Physical Activity:   . Days of Exercise per Week:   . Minutes of Exercise per Session:   Stress:   . Feeling of Stress :   Social Connections:   . Frequency of Communication with Friends and Family:   . Frequency of Social Gatherings with Friends and Family:   . Attends Religious Services:   . Active Member of Clubs or Organizations:   . Attends Archivist Meetings:   Marland Kitchen Marital Status:   Intimate Partner Violence:   . Fear of Current or Ex-Partner:   . Emotionally Abused:   Marland Kitchen Physically Abused:   . Sexually Abused:      PHYSICAL EXAM  Vitals:   10/21/19 1318  BP: 109/71  Pulse: 64  Weight: 197 lb (89.4 kg)  Height: 5\' 7"  (1.702 m)    Not recorded      Body mass index is 30.85 kg/m.  PHYSICAL EXAMNIATION:  Gen: NAD, conversant, well nourised, well groomed                     Cardiovascular: Regular rate rhythm, no peripheral edema, warm, nontender. Eyes: Conjunctivae clear without exudates or hemorrhage Neck: Supple, no carotid bruits. Pulmonary: Clear to auscultation bilaterally   NEUROLOGICAL EXAM:  MENTAL STATUS: Speech:    Speech is normal; fluent and spontaneous with normal comprehension.  Cognition:     Orientation to time, place and person     Normal recent and remote memory     Normal Attention span and concentration     Normal Language, naming, repeating,spontaneous speech     Fund of knowledge   CRANIAL NERVES: CN II: Visual fields are full to confrontation. Pupils are round equal and briskly reactive to light. CN III, IV, VI: extraocular movement are normal. No ptosis. CN V: Facial sensation is intact to light touch CN VII: Face is symmetric  with normal eye closure  CN VIII: Hearing is normal to causal conversation. CN IX, X: Phonation is normal. CN XI: Head turning and shoulder shrug are intact  MOTOR: Slight neck flexion, right shoulder abduction, external rotation weakness, mild bilateral hip flexion weakness,  REFLEXES: Reflexes are 3 and symmetric at the biceps, triceps, knees, and ankles. Plantar responses are flexor.  SENSORY: Intact to light touch, pinprick and vibratory sensation are intact in fingers and toes.  COORDINATION: There is no trunk or limb dysmetria noted.  GAIT/STANCE: He can get up from seated position arms crossed, steady, able to stand up on tiptoe, heels,   DIAGNOSTIC DATA (LABS, IMAGING, TESTING) - I reviewed patient records, labs, notes, testing and imaging myself where available.   ASSESSMENT AND PLAN  IMON SALAMON is a 74 y.o. male   He has history of intermittent generalized fatigue, History of right lumbar radiculopathy, status post decompression,   Examination, he was found to have mild right shoulder abduction, slight neck flexion, mild bilateral hip flexion weakness, with hyperreflexia,  Differentiation diagnosis includes cervical spondylitic myelopathy, CT cervical, head,  Also need to rule out hypoperfusion of bilateral watershed area, ultrasound of carotid artery  Laboratory evaluations, rule out inflammatory disease, intrinsic muscle disease, neuromuscular junctional disorder.  Marcial Pacas, M.D. Ph.D.  [facility] [address], [place], [place] [address] Ph: [phone] Fax: [phone]  CC: Mosie Lukes, MD  Addendum: Neurosurgery evaluation by Dr. Christella Noa on December 30, 2019 for large pituitary adenoma, laboratory evaluation for growth hormone TSH prolactin, FSH LH, and surgical planning

## 2019-10-24 LAB — RPR: RPR Ser Ql: NONREACTIVE

## 2019-10-24 LAB — COPPER, SERUM: Copper: 111 ug/dL (ref 69–132)

## 2019-10-24 LAB — ACETYLCHOLINE RECEPTOR, BINDING: AChR Binding Ab, Serum: <0.03 nmol/L (ref 0.00–0.24)

## 2019-10-24 LAB — CK: Total CK: 77 U/L (ref 41–331)

## 2019-10-24 LAB — VITAMIN B12: Vitamin B-12: 440 pg/mL (ref 232–1245)

## 2019-10-24 LAB — TSH: TSH: 0.932 u[IU]/mL (ref 0.450–4.500)

## 2019-11-04 ENCOUNTER — Other Ambulatory Visit: Payer: Self-pay

## 2019-11-04 ENCOUNTER — Ambulatory Visit (HOSPITAL_COMMUNITY)
Admission: RE | Admit: 2019-11-04 | Discharge: 2019-11-04 | Disposition: A | Discharge status: Home / Self Care | Payer: Medicare HMO | Source: Ambulatory Visit | Attending: Neurology | Admitting: Neurology

## 2019-11-04 DIAGNOSIS — R5383 Other fatigue: Secondary | ICD-10-CM | POA: Insufficient documentation

## 2019-11-04 DIAGNOSIS — R292 Abnormal reflex: Secondary | ICD-10-CM | POA: Diagnosis not present

## 2019-11-04 NOTE — Progress Notes (Signed)
Mr. Slimp:  Ultrasound of carotid arteries showed mild bilateral internal carotid artery less than 39% stenosis.  This can be related to your age, vascular risk factors, hyperlipidemia.   Please continue with current management.  Marcial Pacas, M.D. Ph.D.  [facility] [place], [place] [address] Phone: [phone] Fax:      [phone]

## 2019-11-04 NOTE — Progress Notes (Signed)
Carotid duplex has been completed.   Preliminary results in CV Proc.   Abram Sander 11/04/2019 10:51 AM

## 2019-11-05 ENCOUNTER — Other Ambulatory Visit: Payer: Medicare HMO

## 2019-11-05 NOTE — Telephone Encounter (Signed)
Peer to peer scheduled for today at 4:15pm. Dr. Viann Fish will be calling back on Dr. Rhea Belton mobile number to complete the review.

## 2019-11-05 NOTE — Telephone Encounter (Signed)
[employer] medicare did not approve the CT's.  Based medicare guidelines of the head imaging. Dizziness/vertigo, we cannot approve this request. Your records show that you have dizzy spells. The reason this request cannot be approved is because. 1. We did not receive results of a full workup for your signs and/or symptoms that showed a reason the requested study is needed. A full workup should include the following information. Details about your symptoms. Results of testing of your blood pressure whiling sitting, lying and standing (orthostatic blood pressure). Results of hearing tests results of gail (your pattern of walking) testing. Results of positional testing (dix-hallpike maneuver) results of eye motion testing to check for nystagmus (fast eye motions that you cannot control). Results of testing for a head thrust sing (test to see if you can keep your eyes locked on an item after moving your head abruptly). Results of testing to see if your eyes move upward but apart (in opposite directions). Results of vision testing.  Based medicare guidelines for the neck. Paint without and with neurological features (including stenosis) and general guidelines, we cannot approve this request. Your records show that you have neck pain. The reason this request cannot be approved is because 1. One of the following must be met. Follow up contact (in person, by phone, by mail or by messaging) with your doctor confirmed a failed recent (within three months) six week trial of doctor prescribed treatment. Supported treatments include (but are not limited to) medications for swelling or pain, physical therapy, and/or oral or injected steroids. You have a sign or symptom that suggests a serious underlying condition for which a trail of treatment is not needed.  The phone number for the peer to peer is [phone] option 4. The case number is [id number]. It does need to be scheduled by Friday 11/08/19.

## 2019-11-05 NOTE — Telephone Encounter (Signed)
[id number] CT cervical head and neck.

## 2019-11-06 NOTE — Telephone Encounter (Signed)
Noted, order sent to GI they will reach out to the patient to reschedule.

## 2019-11-22 ENCOUNTER — Ambulatory Visit: Payer: Medicare HMO | Admitting: Family Medicine

## 2019-11-22 ENCOUNTER — Other Ambulatory Visit: Payer: Medicare HMO

## 2019-11-25 ENCOUNTER — Telehealth: Payer: Self-pay | Admitting: Neurology

## 2019-11-25 ENCOUNTER — Other Ambulatory Visit: Payer: Self-pay

## 2019-11-25 ENCOUNTER — Encounter: Payer: Self-pay | Admitting: Family Medicine

## 2019-11-25 ENCOUNTER — Ambulatory Visit
Admission: RE | Admit: 2019-11-25 | Discharge: 2019-11-25 | Disposition: A | Discharge status: Home / Self Care | Payer: Medicare HMO | Source: Ambulatory Visit | Attending: Neurology | Admitting: Neurology

## 2019-11-25 ENCOUNTER — Ambulatory Visit (INDEPENDENT_AMBULATORY_CARE_PROVIDER_SITE_OTHER): Payer: Medicare HMO | Admitting: *Deleted

## 2019-11-25 ENCOUNTER — Ambulatory Visit (INDEPENDENT_AMBULATORY_CARE_PROVIDER_SITE_OTHER): Payer: Medicare HMO | Admitting: Family Medicine

## 2019-11-25 VITALS — BP 110/68 | HR 60 | Temp 97.6°F | Resp 18 | Ht 67.0 in | Wt 195.8 lb

## 2019-11-25 DIAGNOSIS — R292 Abnormal reflex: Secondary | ICD-10-CM

## 2019-11-25 DIAGNOSIS — I495 Sick sinus syndrome: Secondary | ICD-10-CM

## 2019-11-25 DIAGNOSIS — J029 Acute pharyngitis, unspecified: Secondary | ICD-10-CM | POA: Diagnosis not present

## 2019-11-25 DIAGNOSIS — R5383 Other fatigue: Secondary | ICD-10-CM

## 2019-11-25 DIAGNOSIS — D497 Neoplasm of unspecified behavior of endocrine glands and other parts of nervous system: Secondary | ICD-10-CM

## 2019-11-25 DIAGNOSIS — R42 Dizziness and giddiness: Secondary | ICD-10-CM

## 2019-11-25 DIAGNOSIS — M50323 Other cervical disc degeneration at C6-C7 level: Secondary | ICD-10-CM | POA: Diagnosis not present

## 2019-11-25 DIAGNOSIS — R930 Abnormal findings on diagnostic imaging of skull and head, not elsewhere classified: Secondary | ICD-10-CM

## 2019-11-25 IMAGING — CT CT CERVICAL SPINE W/O CM
2 series · 10 of 14 positions shown, 12 images · non-contrast
Comparison: None.

CLINICAL DATA: Hyperreflexia. Fatigue. Cervical myelopathy.
Dizziness.

EXAM:
CT CERVICAL SPINE WITHOUT CONTRAST
TECHNIQUE: Multidetector CT imaging of the cervical spine was performed without
intravenous contrast. Multiplanar CT image reconstructions were also
generated.

[Series 3: cspine soft · axial · 0.34mm/px · z∈[-206,-102]mm · 5 of 79 slices shown]
[im 14/79  soft-tissue]
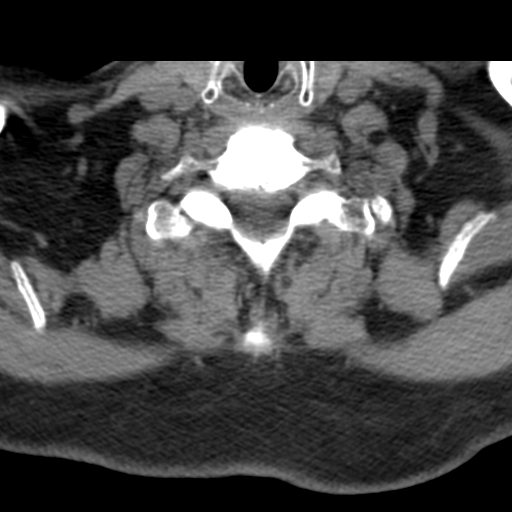
[im 27/79  soft-tissue]
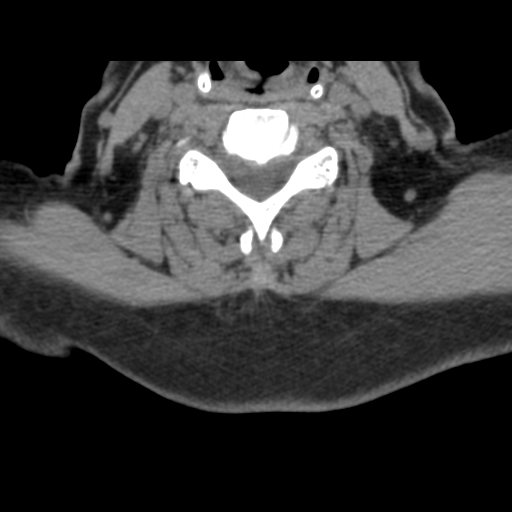
[im 40/79  soft-tissue]
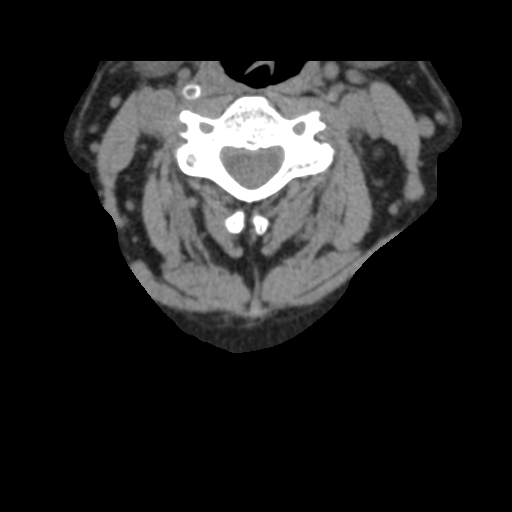
[im 53/79  soft-tissue]
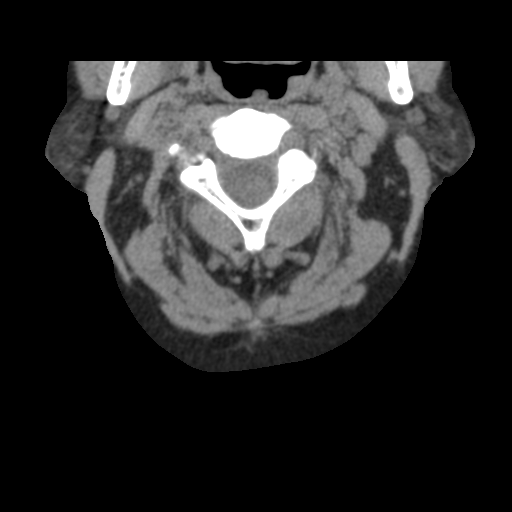
[im 66/79  soft-tissue]
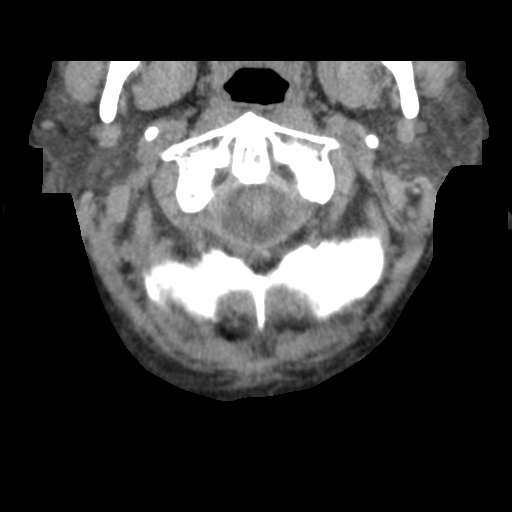

[Series 9: angled axial · axial · 0.29mm/px · z∈[-215,-112]mm · 5 of 80 slices shown, 7 images]
[im 14/80  soft-tissue]
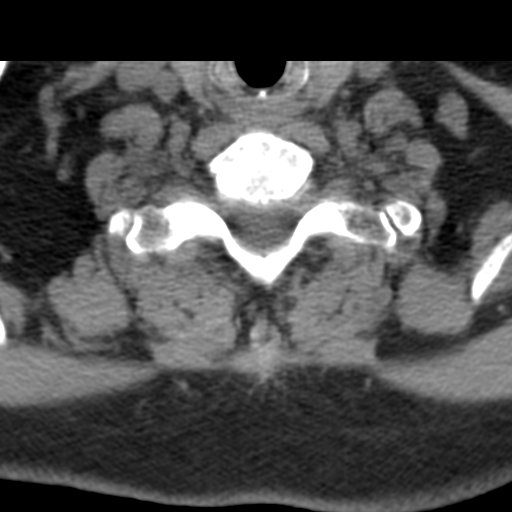
[im 14/80  bone]
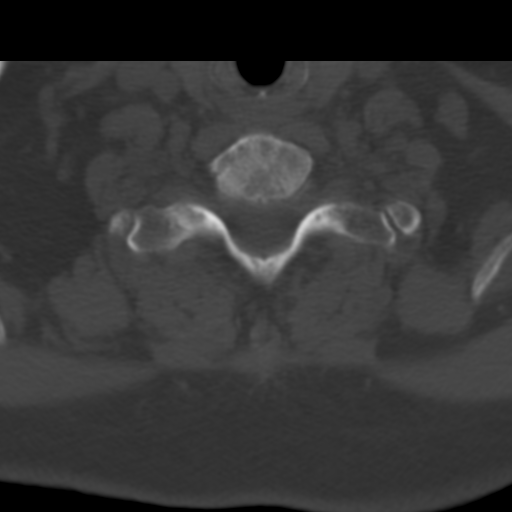
[im 27/80  bone]
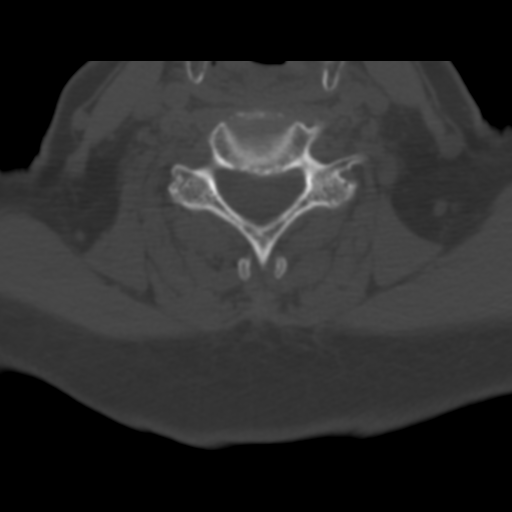
[im 40/80  bone]
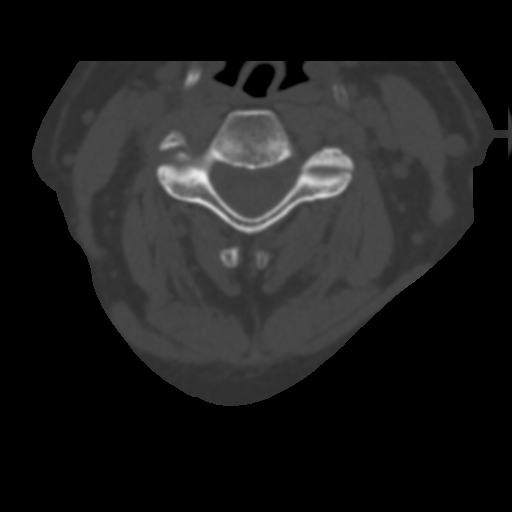
[im 53/80  bone]
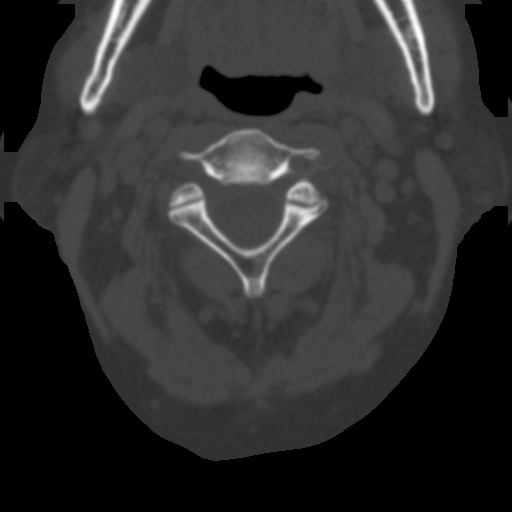
[im 66/80  soft-tissue]
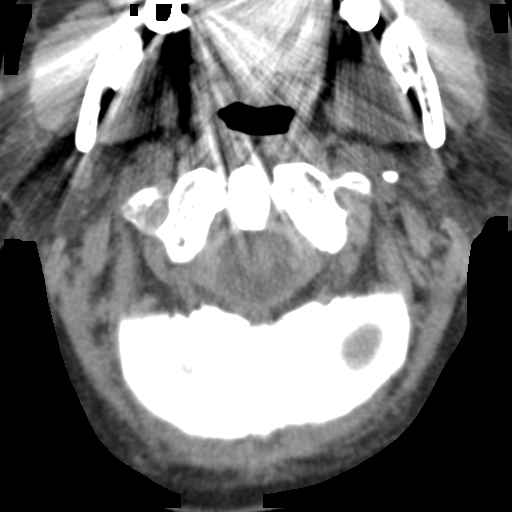
[im 66/80  bone]
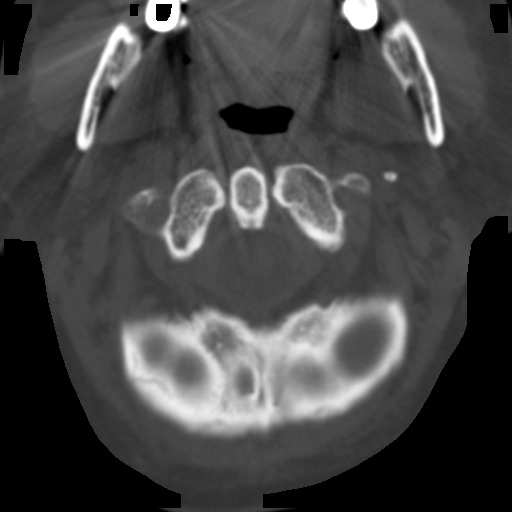

[10 of 14 positions shown; findings below may reference images not displayed]

FINDINGS: Alignment: Normal.

Skull base and vertebrae: No acute fracture or suspicious osseous
lesion. Moderate to severe disc space narrowing at C6-7 with
prominent degenerative endplate sclerosis and small Schmorl's nodes.
Milder degenerative endplate changes at C3-4.

Soft tissues and spinal canal: No prevertebral swelling.

Disc levels:

C2-3: Disc space narrowing.  No stenosis.

C3-4: Disc bulging and mild uncovertebral spurring result in mild
left neural foraminal stenosis without spinal stenosis.

C4-5: Minimal disc bulging without evidence of significant stenosis.

C5-6: Small central disc osteophyte complex without evidence of
significant stenosis.

C6-7: Shallow disc bulging and endplate spurring with borderline
left neural foraminal stenosis. No evidence of significant spinal
stenosis.

C7-T1: Small central disc osteophyte complex without evidence of
significant stenosis.

Upper chest: Unremarkable.

Other: None.
IMPRESSION: Multilevel cervical disc degeneration, advanced at C6-7. No evidence
of high-grade stenosis.

## 2019-11-25 MED ORDER — AMOXICILLIN 875 MG PO TABS: 875.0000 mg | ORAL_TABLET | Freq: Two times a day (BID) | ORAL | 0 refills | Status: DC

## 2019-11-25 NOTE — Assessment & Plan Note (Signed)
abx per orders rto prn

## 2019-11-25 NOTE — Assessment & Plan Note (Signed)
Pt has seen cardio and pulm Pt requesting lyme titre be done due to her sister having same symptoms and being dx with lymes

## 2019-11-25 NOTE — Progress Notes (Signed)
Patient ID: Daniel Reyes, male    DOB: [birth year]  Age: 74 y.o. MRN: [id number]    Subjective:  Subjective  HPI Daniel Reyes presents for toothache  -- he saw the dentist and he said it was not the tooth.  He did xrays.   He started with ear pain , neck pain on the L side ---he also had a swollen gland.  The tooth is better but he still has sore throat and ear pain.   Dentist gave him an abx and he took it for 2 days and left it in MD.    His sister was dx with lyme and has had a lot of the same symptoms he has discussed with Dr Charlett Blake in the past -- he was wondering if he could be tested   Review of Systems  HENT: Positive for sore throat and trouble swallowing. Negative for congestion, postnasal drip, rhinorrhea, sinus pressure, sinus pain and sneezing.   Respiratory: Negative for cough, chest tightness, shortness of breath and wheezing.   Cardiovascular: Negative for chest pain.  Musculoskeletal: Positive for neck pain.    History Past Medical History:  Diagnosis Date  . History of chicken pox   . Hyperlipemia   . Mild CAD    a. cath 02/09/17 - Mild to moderate proximal and mid LAD plaque with less than 40% narrowing  . Nocturnal hypoxia     He has a past surgical history that includes Cholecystectomy open (1998); Neck surgery (1983); gerd surgery; RIGHT/LEFT HEART CATH AND CORONARY ANGIOGRAPHY (N/A, 02/09/2017); Tonsillectomy and adenoidectomy; Back surgery (1980); and PACEMAKER IMPLANT (N/A, 05/15/2018).   His family history includes Alzheimer's disease in his mother and paternal grandmother; Cancer in his father; Colon cancer in his father; Heart attack in his father; Heart disease in his brother; Mental illness in his maternal grandfather; Peripheral vascular disease in his paternal grandfather; Stroke in his brother and maternal grandmother.He reports that he has never smoked. He has never used smokeless tobacco. He reports current alcohol use. He reports that he does not use  drugs.  Current Outpatient Medications on File Prior to Visit  Medication Sig Dispense Refill  . albuterol (PROVENTIL HFA;VENTOLIN HFA) 108 (90 Base) MCG/ACT inhaler Inhale 2 puffs into the lungs every 6 (six) hours as needed for wheezing or shortness of breath. 1 Inhaler 2  . atorvastatin (LIPITOR) 20 MG tablet TAKE 1 TABLET BY MOUTH EVERY DAY 90 tablet 0  . BREO ELLIPTA 100-25 MCG/INH AEPB TAKE 1 PUFF BY MOUTH EVERY DAY 180 each 3  . Multiple Vitamin (MULTIVITAMIN WITH MINERALS) TABS tablet Take 1 tablet by mouth daily.     No current facility-administered medications on file prior to visit.     Objective:  Objective  Physical Exam Vitals and nursing note reviewed.  Constitutional:      General: He is sleeping.     Appearance: He is well-developed.  HENT:     Head: Normocephalic and atraumatic.     Mouth/Throat:     Pharynx: Posterior oropharyngeal erythema present. No oropharyngeal exudate.  Eyes:     Pupils: Pupils are equal, round, and reactive to light.  Neck:     Thyroid: No thyromegaly.  Cardiovascular:     Rate and Rhythm: Normal rate and regular rhythm.     Heart sounds: No murmur heard.   Pulmonary:     Effort: Pulmonary effort is normal. No respiratory distress.     Breath sounds: Normal breath sounds. No wheezing or  rales.  Chest:     Chest wall: No tenderness.  Musculoskeletal:     Cervical back: Normal range of motion and neck supple. Tenderness present.  Lymphadenopathy:     Cervical: Cervical adenopathy present.     Right cervical: No superficial cervical adenopathy.    Left cervical: Superficial cervical adenopathy present.  Skin:    General: Skin is warm and dry.  Neurological:     Mental Status: He is oriented to person, place, and time.  Psychiatric:        Behavior: Behavior normal.        Thought Content: Thought content normal.        Judgment: Judgment normal.    BP 110/68 (BP Location: Right Arm, Patient Position: Sitting, Cuff Size:  Normal)   Pulse 60   Temp 97.6 F (36.4 C) (Temporal)   Resp 18   Ht 5\' 7"  (1.702 m)   Wt 195 lb 12.8 oz (88.8 kg)   SpO2 96%   BMI 30.67 kg/m  Wt Readings from Last 3 Encounters:  11/25/19 195 lb 12.8 oz (88.8 kg)  10/21/19 197 lb (89.4 kg)  01/21/19 189 lb 3.2 oz (85.8 kg)     Lab Results  Component Value Date   WBC 5.8 01/21/2019   HGB 13.7 01/21/2019   HCT 39.6 01/21/2019   PLT 207.0 01/21/2019   GLUCOSE 98 12/06/2018   CHOL 137 12/06/2018   TRIG 81.0 12/06/2018   HDL 44.00 12/06/2018   LDLCALC 77 12/06/2018   ALT 23 12/06/2018   AST 20 12/06/2018   NA 141 12/06/2018   K 4.2 12/06/2018   CL 108 12/06/2018   CREATININE 1.01 12/06/2018   BUN 17 12/06/2018   CO2 27 12/06/2018   TSH 0.932 10/21/2019   PSA 0.59 12/06/2018   INR 1.05 02/08/2017   HGBA1C 5.8 12/06/2018    CT CERVICAL SPINE WO CONTRAST  Result Date: 11/25/2019 CLINICAL DATA:  Hyperreflexia. Fatigue. Cervical myelopathy. Dizziness. EXAM: CT CERVICAL SPINE WITHOUT CONTRAST TECHNIQUE: Multidetector CT imaging of the cervical spine was performed without intravenous contrast. Multiplanar CT image reconstructions were also generated. COMPARISON:  None. FINDINGS: Alignment: Normal. Skull base and vertebrae: No acute fracture or suspicious osseous lesion. Moderate to severe disc space narrowing at C6-7 with prominent degenerative endplate sclerosis and small Schmorl's nodes. Milder degenerative endplate changes at T2-6. Soft tissues and spinal canal: No prevertebral swelling. Disc levels: C2-3: Disc space narrowing.  No stenosis. C3-4: Disc bulging and mild uncovertebral spurring result in mild left neural foraminal stenosis without spinal stenosis. C4-5: Minimal disc bulging without evidence of significant stenosis. C5-6: Small central disc osteophyte complex without evidence of significant stenosis. C6-7: Shallow disc bulging and endplate spurring with borderline left neural foraminal stenosis. No evidence of  significant spinal stenosis. C7-T1: Small central disc osteophyte complex without evidence of significant stenosis. Upper chest: Unremarkable. Other: None. IMPRESSION: Multilevel cervical disc degeneration, advanced at C6-7. No evidence of high-grade stenosis. Electronically Signed   By: Logan Bores M.D.   On: 11/25/2019 11:51     Assessment & Plan:  Plan  I am having Everlene Other. Hindle start on amoxicillin. I am also having him maintain his albuterol, multivitamin with minerals, Breo Ellipta, and atorvastatin.  Meds ordered this encounter  Medications  . amoxicillin (AMOXIL) 875 MG tablet    Sig: Take 1 tablet (875 mg total) by mouth 2 (two) times daily.    Dispense:  20 tablet    Refill:  0  Problem List Items Addressed This Visit      Unprioritized   Dizziness    Pt has seen cardio and pulm Pt requesting lyme titre be done due to her sister having same symptoms and being dx with lymes       Relevant Orders   B. burgdorfi antibodies   Rocky mtn spotted fvr abs pnl(IgG+IgM)   Pharyngitis - Primary    abx per orders rto prn       Relevant Medications   amoxicillin (AMOXIL) 875 MG tablet      Follow-up: Return if symptoms worsen or fail to improve.  Ann Held, DO

## 2019-11-25 NOTE — Patient Instructions (Addendum)

## 2019-11-25 NOTE — Telephone Encounter (Addendum)
IMPRESSION: Multilevel cervical disc degeneration, advanced at C6-7. No evidence of high-grade stenosis.  Please review CT cervical spine showed mild degenerative changes,  No acute abnormalities  IMPRESSION:   CT head (without) demonstrating: - Pituitary gland is enlarged and measures 2.5x1.7x2.2cm (SI x AP x trans). Pituitary stalk is midline. Superior bulging potentially contacts the optic chiasm. 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 - No acute findings.  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 Lovena Le) if patient is not sure, I will email Dr. Lovena Le myself  I will refer him to neurosurgeon for evaluation, ask him if he has any preference  CT cervical spine showed multilevel degenerative changes, no evidence of spinal cord compression  If he has a lot of questions, may move up his follow-up appointment, we can review the films together, and potentially repeat some laboratory evaluation

## 2019-11-26 LAB — CUP PACEART REMOTE DEVICE CHECK
Battery Remaining Longevity: 138 mo
Battery Remaining Percentage: 100 %
Brady Statistic RA Percent Paced: 76 %
Brady Statistic RV Percent Paced: 4 %
Date Time Interrogation Session: 20210614025200
Implantable Lead Implant Date: 20191203
Implantable Lead Implant Date: 20191203
Implantable Lead Location: 753859
Implantable Lead Location: 753860
Implantable Lead Model: 7740
Implantable Lead Model: 7741
Implantable Lead Serial Number: 1095486
Implantable Lead Serial Number: 711800
Implantable Pulse Generator Implant Date: 20191203
Lead Channel Impedance Value: 620 Ohm
Lead Channel Impedance Value: 622 Ohm
Lead Channel Pacing Threshold Amplitude: 0.4 V
Lead Channel Pacing Threshold Amplitude: 1.1 V
Lead Channel Pacing Threshold Pulse Width: 0.4 ms
Lead Channel Pacing Threshold Pulse Width: 0.4 ms
Lead Channel Setting Pacing Amplitude: 3.5 V
Lead Channel Setting Pacing Amplitude: 3.5 V
Lead Channel Setting Pacing Pulse Width: 0.4 ms
Lead Channel Setting Sensing Sensitivity: 2.5 mV
Pulse Gen Serial Number: 838922

## 2019-11-26 LAB — REFLEX RMSF IGG TITER: RMSF IgG Titer: 1:64 {titer} — ABNORMAL HIGH

## 2019-11-26 LAB — ROCKY MTN SPOTTED FVR ABS PNL(IGG+IGM)
RMSF IgG: DETECTED — AB
RMSF IgM: NOT DETECTED

## 2019-11-26 LAB — B. BURGDORFI ANTIBODIES: B burgdorferi Ab IgG+IgM: <0.9 index

## 2019-11-27 ENCOUNTER — Other Ambulatory Visit: Payer: Self-pay

## 2019-11-27 DIAGNOSIS — R930 Abnormal findings on diagnostic imaging of skull and head, not elsewhere classified: Secondary | ICD-10-CM | POA: Insufficient documentation

## 2019-11-27 MED ORDER — DOXYCYCLINE HYCLATE 100 MG PO CAPS
100.0000 mg | ORAL_CAPSULE | Freq: Two times a day (BID) | ORAL | 0 refills | Status: DC
Start: 2019-11-27 — End: 2020-02-17

## 2019-11-27 NOTE — Telephone Encounter (Signed)
I spoke to the patient and he verbalized understanding of the information below.  He is unsure if his pacemaker is MRI compatible. He said MRI scans have been avoided in the past. He would like to know the next step in the enlarged pituitary gland evaluation if he is unable to have the MRI.  He is agreeable to a neurosurgical evaluation. He has no preference of provider. He would like you to make the decision on the referral.

## 2019-11-27 NOTE — Progress Notes (Signed)
Remote pacemaker transmission.   

## 2019-11-27 NOTE — Telephone Encounter (Signed)
Left message requesting a return call.

## 2019-11-27 NOTE — Addendum Note (Signed)
Addended by: Marcial Pacas on: 11/27/2019 12:00 PM   Modules accepted: Orders

## 2019-11-28 DIAGNOSIS — D497 Neoplasm of unspecified behavior of endocrine glands and other parts of nervous system: Secondary | ICD-10-CM | POA: Insufficient documentation

## 2019-11-28 NOTE — Telephone Encounter (Signed)
I have referred him to neurosurgeon, make sure that he is on schedule with them for pituitary tumor.  I will also email his cardiologist Dr. Cristopher Peru to see if his  PPM is MRI compatible.

## 2019-12-02 NOTE — Telephone Encounter (Signed)
Referral has been sent to Neurosurgery

## 2019-12-04 ENCOUNTER — Telehealth: Payer: Self-pay | Admitting: Neurology

## 2019-12-04 DIAGNOSIS — D497 Neoplasm of unspecified behavior of endocrine glands and other parts of nervous system: Secondary | ICD-10-CM

## 2019-12-04 NOTE — Telephone Encounter (Signed)
Pacemaker is MRI compatible.

## 2019-12-04 NOTE — Telephone Encounter (Signed)
Pt called stating he has a Neurosurgeon that he would like to be referred to since a referral has been put in for him. Please advise.

## 2019-12-05 NOTE — Addendum Note (Signed)
Addended by: Marcial Pacas on: 12/05/2019 02:49 PM   Modules accepted: Orders

## 2019-12-05 NOTE — Telephone Encounter (Signed)
I have ordered MRI of the brain with without contrast, thin cuts through pituitary gland  I have discussed with his cardiologist Dr. Crissie Sickles, his pacemaker is MRI compatible,  Please schedule his MRI to be done at [place], if they have any questions about his pacemaker, you may contact Dr.Taylor's office  Daniel Lance, MD Consulting Physician Cardiology 12/05/2019 End  12/05/19  Phone: [phone]; Fax: [phone]

## 2019-12-05 NOTE — Telephone Encounter (Signed)
Called patient  Back and left him a message relaying that phones were down and that I will try to call him back.

## 2019-12-09 NOTE — Telephone Encounter (Signed)
Patient and his wife wants Dr. Krista Blue to call has more Questions to ask about tumor .

## 2019-12-09 NOTE — Addendum Note (Signed)
Addended by: Marcial Pacas on: 12/09/2019 04:35 PM   Modules accepted: Orders

## 2019-12-09 NOTE — Telephone Encounter (Signed)
Patient called stating he would like to see Dr. Charmian Muff and he isn't sure if he should give the office a call or will our office call?

## 2019-12-09 NOTE — Telephone Encounter (Signed)
Called Neuro Surg and relayed that patient wanted to see Dr. Christella Noa patient has seen Dr. Ellene Route in past. They have a process to go through. Patient wants understand .

## 2019-12-09 NOTE — Telephone Encounter (Signed)
1.I have talked with patient, he wants to see a neurosurgeon Dr. Winfield Cunas, MD-Dana please make sure the referral to neurosurgical group include that information  2.I also refer him to see ophthalmologist, for formal visual field testing  3. I have put in the order for MRI of the brain with without contrast on November 25, 2019, so far, patient has not heard anything about it yet, please make sure he is on schedule for MRI of the brain with without contrast soon   I have discussed with his cardiologist Dr. Crissie Sickles, his pacemaker is MRI compatible,  Please schedule his MRI to be done at [place], if they have any questions about his pacemaker, you may contact Dr.Taylor's office  Evans Lance, MD Consulting Physician Cardiology 12/05/2019 End  12/05/19  Phone: [phone]; Fax: [phone]

## 2019-12-10 NOTE — Telephone Encounter (Signed)
[employer] pending faxed notes  °

## 2019-12-10 NOTE — Telephone Encounter (Signed)
Once his MRI has been approved with his insurance he will be scheduled at [facility]. The hospital is the only place that will do MRI's with patients that have pacemakers not [facility].

## 2019-12-11 NOTE — Telephone Encounter (Signed)
Referral has been sent to [place] [facility] and Dr. Katy Fitch . Patient has seen Dr.Elsner Before and now wants to see Dr. Christella Noa so that will have to go through a process with Neurosurgery . Patient and his wife are aware .

## 2019-12-11 NOTE — Telephone Encounter (Signed)
[employer] medicare Josem Kaufmann: [id number] (exp. 12/10/19 to 06/07/20) faxed pacemaker info to [facility] if MRI safe they will reach out to pt to schedule.

## 2019-12-12 NOTE — Telephone Encounter (Signed)
Patient is scheduled at [facility] for 12/26/19.

## 2019-12-19 ENCOUNTER — Telehealth: Payer: Self-pay | Admitting: Neurology

## 2019-12-19 NOTE — Telephone Encounter (Signed)
Dr. Patrici Ranks office relayed that patient declined to schedule he has his own eye doctor. I called patient and could not leave message to see if he wanted me to fax order to his doctor could not leave a message I will try again .

## 2019-12-23 NOTE — Telephone Encounter (Signed)
Called patient's Eye Doctor Dr, Mickie Hillier he will see him July 21 st at 2:30 pm Fax referral to [phone]  Patient is aware of details . [facility]

## 2019-12-24 ENCOUNTER — Ambulatory Visit: Payer: Medicare HMO | Admitting: Neurology

## 2019-12-26 ENCOUNTER — Ambulatory Visit (HOSPITAL_COMMUNITY)
Admission: RE | Admit: 2019-12-26 | Discharge: 2019-12-26 | Disposition: A | Discharge status: Home / Self Care | Payer: Medicare HMO | Source: Ambulatory Visit | Attending: Neurology | Admitting: Neurology

## 2019-12-26 ENCOUNTER — Other Ambulatory Visit: Payer: Self-pay

## 2019-12-26 DIAGNOSIS — D497 Neoplasm of unspecified behavior of endocrine glands and other parts of nervous system: Secondary | ICD-10-CM | POA: Insufficient documentation

## 2019-12-26 DIAGNOSIS — R930 Abnormal findings on diagnostic imaging of skull and head, not elsewhere classified: Secondary | ICD-10-CM | POA: Diagnosis not present

## 2019-12-26 DIAGNOSIS — D352 Benign neoplasm of pituitary gland: Secondary | ICD-10-CM | POA: Diagnosis not present

## 2019-12-26 DIAGNOSIS — G939 Disorder of brain, unspecified: Secondary | ICD-10-CM | POA: Diagnosis not present

## 2019-12-26 IMAGING — MR MR HEAD WO/W CM
19 of 24 series · 38 of 48 positions shown · IV contrast (gadavist)
Comparison: Head CT [DATE]

CLINICAL DATA: Abnormal CT of the head.  Pituitary tumor.

EXAM:
MRI HEAD WITHOUT AND WITH CONTRAST
TECHNIQUE: Multiplanar, multiecho pulse sequences of the brain and surrounding
structures were obtained without and with intravenous contrast.
CONTRAST:  9mL GADAVIST GADOBUTROL 1 MMOL/ML IV SOLN

[Series 5: DWI · axial · 3.0mm · 0.92mm/px · z∈[-79,+78]mm · 6 of 108 slices shown (1 of 2)]
[im 1/108]
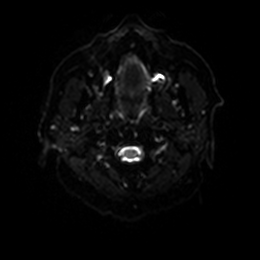
[im 22/108]
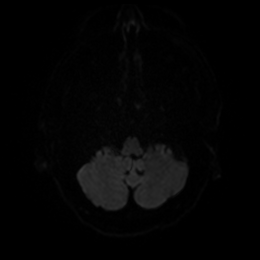
[im 43/108]
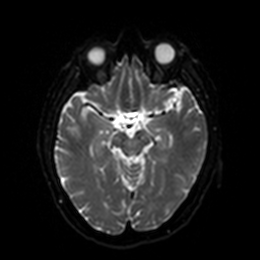
[im 65/108]
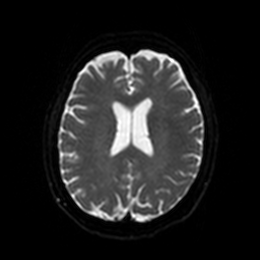
[im 86/108]
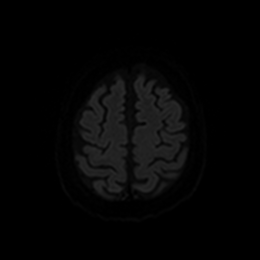
[im 108/108]
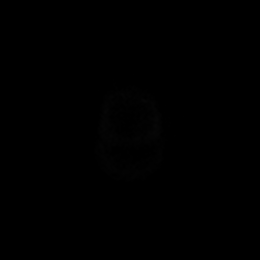

[Series 6: DWI · axial · 3.0mm · 0.92mm/px · z∈[-79,+78]mm · 2 of 54 slices shown (2 of 2)]
[im 1/54]
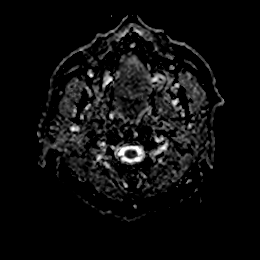
[im 54/54]
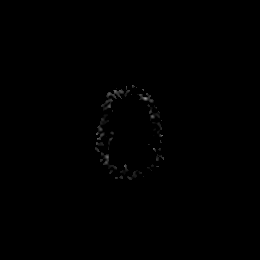

[Series 7: T1 · sagittal · 5.0mm · 0.78mm/px · 1 of 23 slices shown (1 of 3)]
[im 1/23]
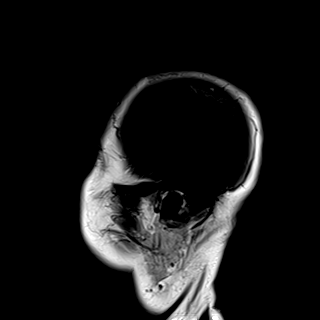

[Series 8: T2 · axial · 5.0mm · 0.72mm/px · 1 of 25 slices shown]
[im 1/25]
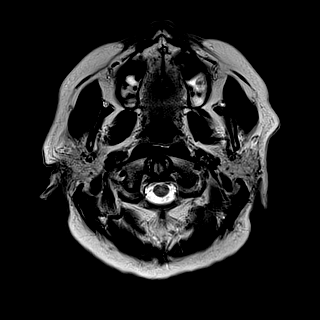

[Series 9: FLAIR · axial · 5.0mm · 0.45mm/px · 1 of 25 slices shown]
[im 1/25]
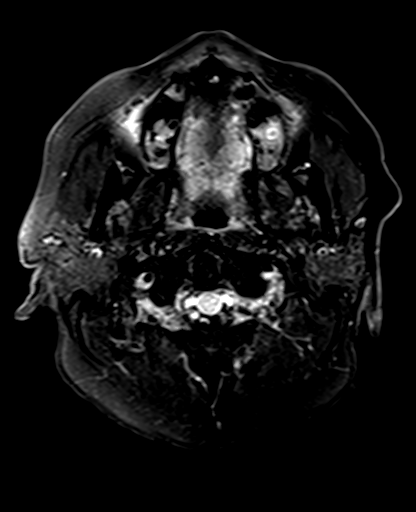

[Series 10: mag_images · axial · 3.0mm · 0.90mm/px · z∈[-86,+88]mm · 4 of 60 slices shown]
[im 1/60]
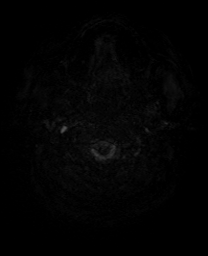
[im 20/60]
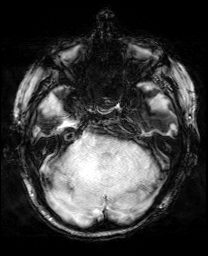
[im 40/60]
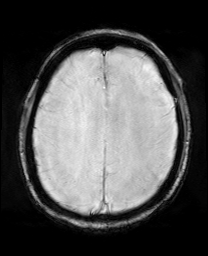
[im 60/60]
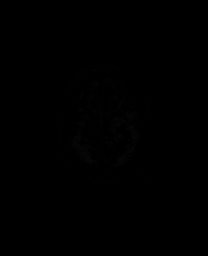

[Series 11: pha_images · axial · 3.0mm · 0.90mm/px · z∈[-86,+88]mm · 4 of 60 slices shown]
[im 1/60]
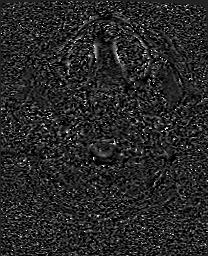
[im 20/60]
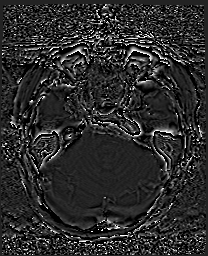
[im 40/60]
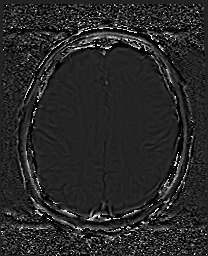
[im 60/60]
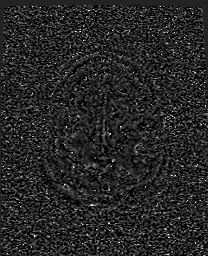

[Series 12: swi_images · axial · 3.0mm · 0.90mm/px · z∈[-86,+88]mm · 4 of 60 slices shown]
[im 1/60]
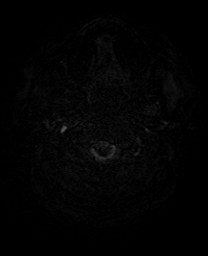
[im 20/60]
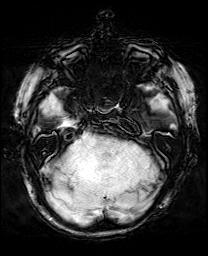
[im 40/60]
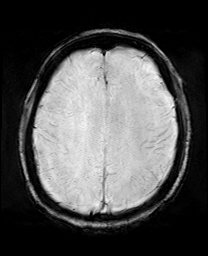
[im 60/60]
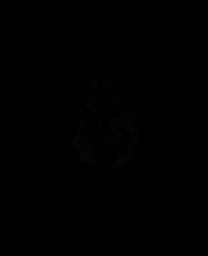

[Series 13: mip_images(sw) · axial · 24.0mm · 0.90mm/px · z∈[-76,+78]mm · 3 of 53 slices shown]
[im 1/53]
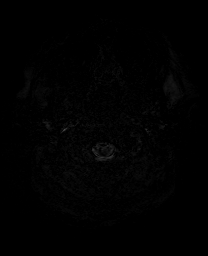
[im 27/53]
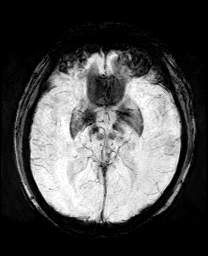
[im 53/53]
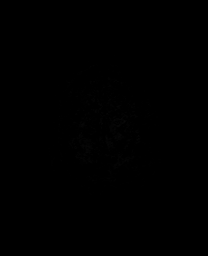

[Series 15: T1 · coronal · 3.0mm · 0.27mm/px · 1 of 16 slices shown (2 of 3)]
[im 1/16]
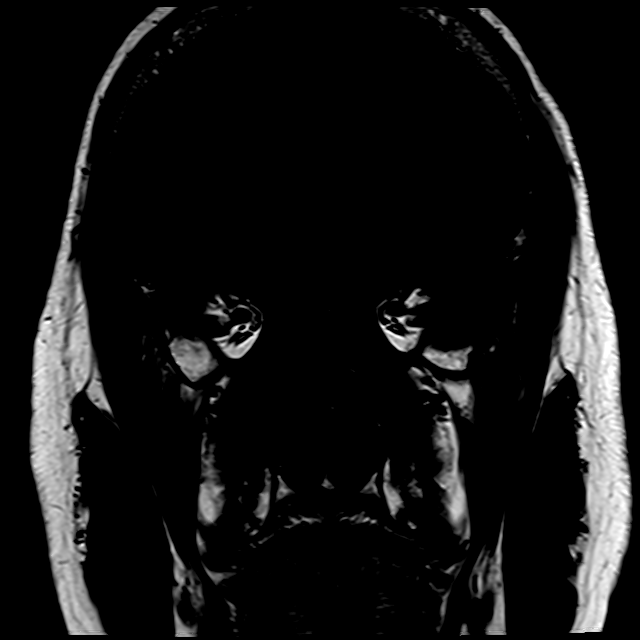

[Series 16: T1 · sagittal · 3.0mm · 0.25mm/px · 1 of 14 slices shown (3 of 3)]
[im 1/14]
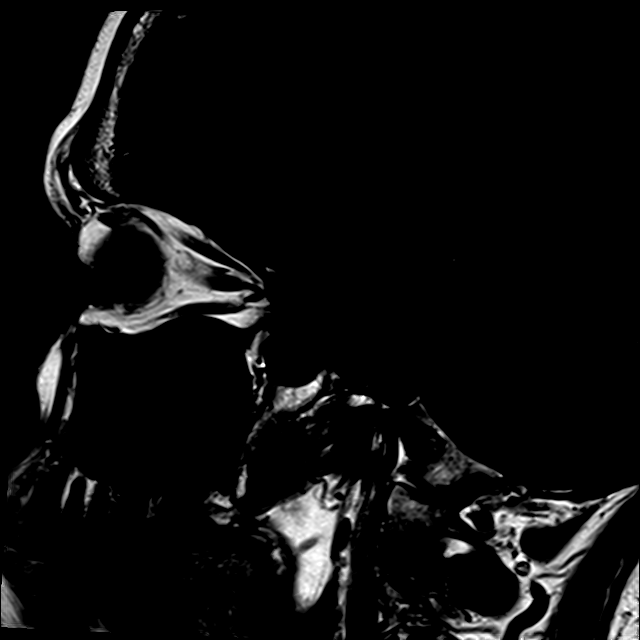

[Series 17: t1_tse_cor_dynamic pre · coronal · non-contrast · 3.0mm · 0.49mm/px · 1 of 8 slices shown]
[im 1/8]
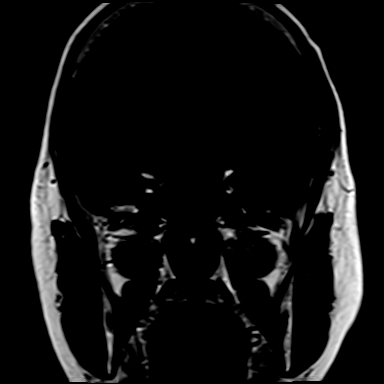

[Series 18: t1_tse_cor_dynamic post · coronal · 3.0mm · 0.49mm/px · 1 of 8 slices shown (1 of 3)]
[im 1/8]
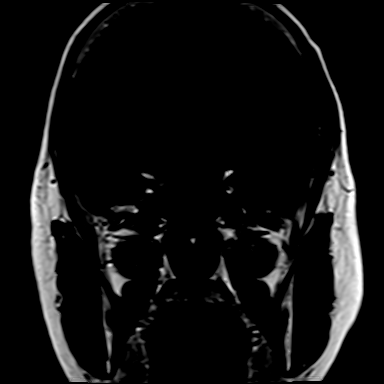

[Series 19: t1_tse_cor_dynamic post · coronal · 3.0mm · 0.49mm/px · 1 of 8 slices shown (2 of 3)]
[im 1/8]
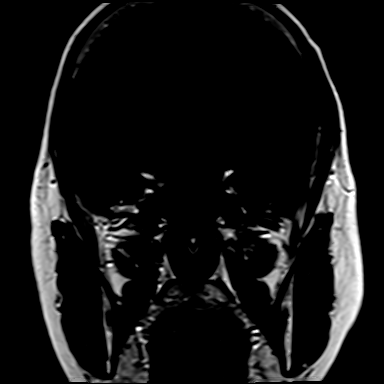

[Series 20: t1_tse_cor_dynamic post · coronal · 3.0mm · 0.49mm/px · 1 of 8 slices shown (3 of 3)]
[im 1/8]
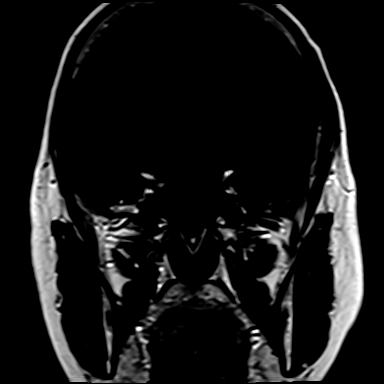

[Series 24: T1 post-contrast · sagittal · 3.0mm · 0.25mm/px · 1 of 14 slices shown (1 of 3)]
[im 1/14]
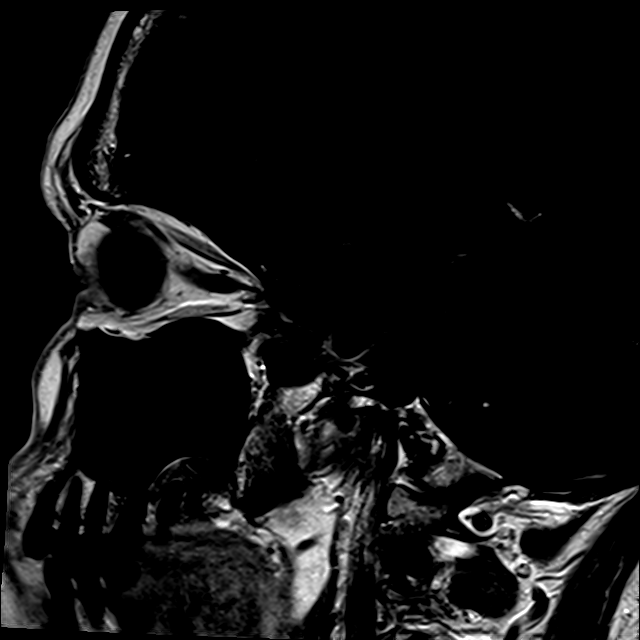

[Series 25: T1 post-contrast · coronal · 3.0mm · 0.25mm/px · 1 of 16 slices shown (2 of 3)]
[im 1/16]
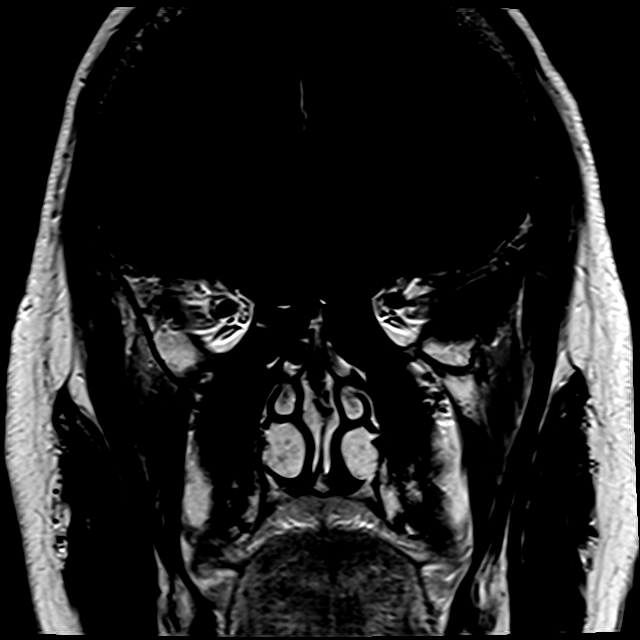

[Series 27: T1 post-contrast · coronal · 5.0mm · 0.34mm/px · 2 of 29 slices shown (3 of 3)]
[im 1/29]
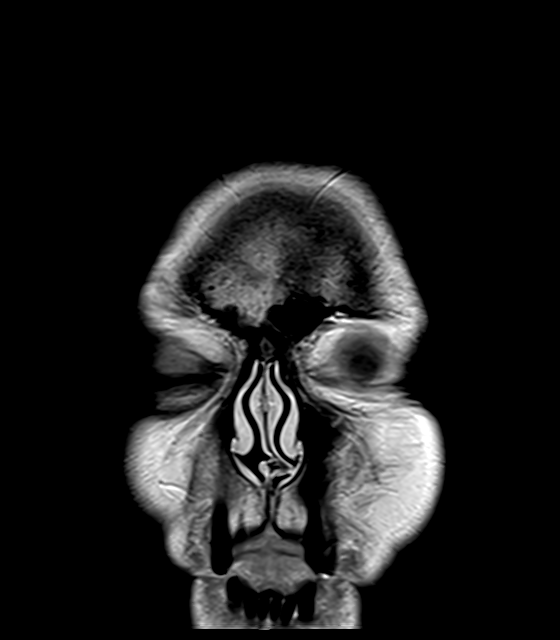
[im 29/29]
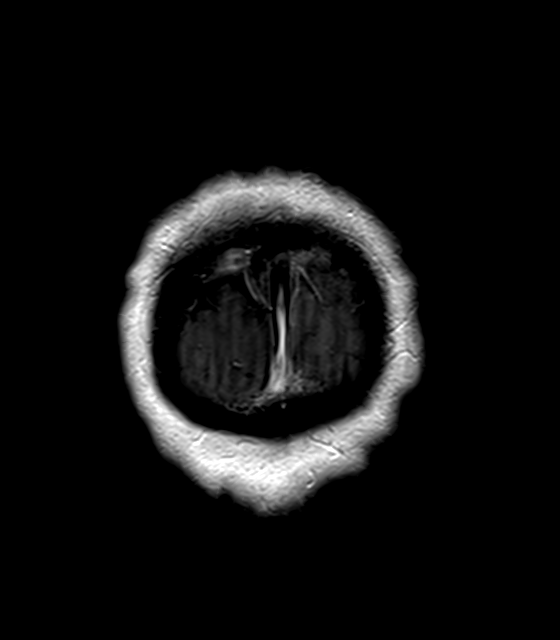

[Series 28: T2 post-contrast · coronal · 5.0mm · 0.72mm/px · 2 of 28 slices shown]
[im 1/28]
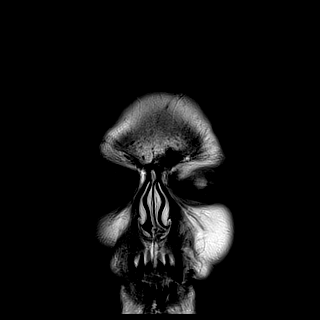
[im 28/28]
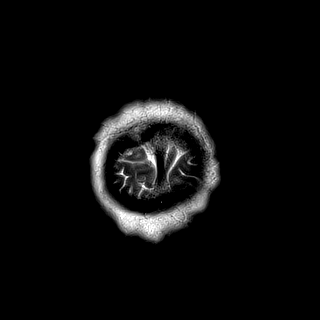

[38 of 48 positions shown; findings below may reference images not displayed]

FINDINGS: Brain: Confirmed sellar mass with suprasellar extension,
indistinguishable from a normal pituitary gland and solid with
homogeneous enhancement. The mass measures 24 x 17 x 17 mm. There is
contact of the optic chiasm without notable chiasmatic displacement.
No indication of cavernous sinus invasion. No adjacent dural
thickening.

Brain itself is unremarkable with no infarct, hemorrhage,
hydrocephalus, or chronic blood products.

Vascular: Normal flow voids and vascular enhancements

Skull and upper cervical spine: Normal marrow signal

Sinuses/Orbits: Negative
IMPRESSION: Sellar and suprasellar mass consistent with pituitary macroadenoma,
24 x 17 x 17 mm. There is chiasmatic contact; no cavernous sinus
invasion.

## 2019-12-26 MED ORDER — GADOBUTROL 1 MMOL/ML IV SOLN
9.0000 mL | Freq: Once | INTRAVENOUS | Status: AC | PRN
  Administered 2019-12-26: 9 mL via INTRAVENOUS

## 2019-12-30 ENCOUNTER — Telehealth: Payer: Self-pay | Admitting: Neurology

## 2019-12-30 DIAGNOSIS — D497 Neoplasm of unspecified behavior of endocrine glands and other parts of nervous system: Secondary | ICD-10-CM | POA: Diagnosis not present

## 2019-12-30 NOTE — Telephone Encounter (Signed)
I spoke to the patient. He saw Dr. Christella Noa today and is having more lab work completed.  Once this is resulted, he expects that his next step will be planned. He is hoping for surgery soon.

## 2019-12-30 NOTE — Telephone Encounter (Signed)
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 There is chiasmatic contact; no cavernous sinus invasion. Please call patient, MRI of the brain with without contrast showed suprasellar mass 2.7 x 1.7 x 1.7 cm, most consistent with pituitary macroadenoma  Make sure he is on schedule for neurosurgeon

## 2020-01-01 DIAGNOSIS — H35372 Puckering of macula, left eye: Secondary | ICD-10-CM | POA: Diagnosis not present

## 2020-01-01 DIAGNOSIS — H43813 Vitreous degeneration, bilateral: Secondary | ICD-10-CM | POA: Diagnosis not present

## 2020-01-01 DIAGNOSIS — H2513 Age-related nuclear cataract, bilateral: Secondary | ICD-10-CM | POA: Diagnosis not present

## 2020-01-01 DIAGNOSIS — H53022 Refractive amblyopia, left eye: Secondary | ICD-10-CM | POA: Diagnosis not present

## 2020-01-14 DIAGNOSIS — D497 Neoplasm of unspecified behavior of endocrine glands and other parts of nervous system: Secondary | ICD-10-CM | POA: Diagnosis not present

## 2020-01-21 ENCOUNTER — Other Ambulatory Visit: Payer: Self-pay | Admitting: Otolaryngology

## 2020-01-21 ENCOUNTER — Other Ambulatory Visit: Payer: Self-pay | Admitting: Neurosurgery

## 2020-01-25 DIAGNOSIS — Z20828 Contact with and (suspected) exposure to other viral communicable diseases: Secondary | ICD-10-CM | POA: Diagnosis not present

## 2020-01-27 ENCOUNTER — Encounter: Payer: Medicare HMO | Admitting: Family Medicine

## 2020-01-28 DIAGNOSIS — J321 Chronic frontal sinusitis: Secondary | ICD-10-CM | POA: Diagnosis not present

## 2020-01-28 DIAGNOSIS — E236 Other disorders of pituitary gland: Secondary | ICD-10-CM | POA: Diagnosis not present

## 2020-01-28 DIAGNOSIS — J323 Chronic sphenoidal sinusitis: Secondary | ICD-10-CM | POA: Diagnosis not present

## 2020-01-28 DIAGNOSIS — J342 Deviated nasal septum: Secondary | ICD-10-CM | POA: Diagnosis not present

## 2020-01-28 DIAGNOSIS — J0130 Acute sphenoidal sinusitis, unspecified: Secondary | ICD-10-CM | POA: Diagnosis not present

## 2020-02-10 ENCOUNTER — Other Ambulatory Visit: Payer: Self-pay | Admitting: Family Medicine

## 2020-02-11 ENCOUNTER — Encounter: Payer: Self-pay | Admitting: Internal Medicine

## 2020-02-11 NOTE — Progress Notes (Signed)
Your procedure is scheduled on Friday, September 3rd.  Report to [facility] Main Entrance "A" at 5:30 A.M., and check in at the Admitting office.  Call this number if you have problems the morning of surgery:  [phone]  Call [phone] if you have any questions prior to your surgery date Monday-Friday 8am-4pm   Remember:  Do not eat or drink after midnight the night before your surgery    Take these medicines the morning of surgery with A SIP OF WATER  atorvastatin (LIPITOR) BREO ELLIPTA/inhaler  IF NEEDED: albuterol (PROVENTIL HFA;VENTOLIN HFA)/inhaler- bring with you the day of surgery  As of today, STOP taking any Aspirin (unless otherwise instructed by your surgeon) Aleve, Naproxen, Ibuprofen, Motrin, Advil, Goody's, BC's, all herbal medications, fish oil, and all vitamins.                     Do not wear jewelry.            Do not wear lotions, powders, colognes, or deodorant.            Men may shave face and neck.            Do not bring valuables to the hospital.            [facility] is not responsible for any belongings or valuables.  Do NOT Smoke (Tobacco/Vaping) or drink Alcohol 24 hours prior to your procedure If you use a CPAP at night, you may bring all equipment for your overnight stay.   Contacts, glasses, dentures or bridgework may not be worn into surgery.      For patients admitted to the hospital, discharge time will be determined by your treatment team.   Patients discharged the day of surgery will not be allowed to drive home, and someone needs to stay with them for 24 hours.  Special instructions:   [facility]- Preparing For Surgery  Before surgery, you can play an important role. Because skin is not sterile, your skin needs to be as free of germs as possible. You can reduce the number of germs on your skin by washing with CHG (chlorahexidine gluconate) Soap before surgery.  CHG is an antiseptic cleaner which kills germs and bonds with the skin to  continue killing germs even after washing.    Oral Hygiene is also important to reduce your risk of infection.  Remember - BRUSH YOUR TEETH THE MORNING OF SURGERY WITH YOUR REGULAR TOOTHPASTE  Please do not use if you have an allergy to CHG or antibacterial soaps. If your skin becomes reddened/irritated stop using the CHG.  Do not shave (including legs and underarms) for at least 48 hours prior to first CHG shower. It is OK to shave your face.  Please follow these instructions carefully.   1. Shower the NIGHT BEFORE SURGERY and the MORNING OF SURGERY with CHG Soap.   2. If you chose to wash your hair, wash your hair first as usual with your normal shampoo.  3. After you shampoo, rinse your hair and body thoroughly to remove the shampoo.  4. Use CHG as you would any other liquid soap. You can apply CHG directly to the skin and wash gently with a scrungie or a clean washcloth.   5. Apply the CHG Soap to your body ONLY FROM THE NECK DOWN.  Do not use on open wounds or open sores. Avoid contact with your eyes, ears, mouth and genitals (private parts). Wash Face and genitals (  private parts)  with your normal soap.   6. Wash thoroughly, paying special attention to the area where your surgery will be performed.  7. Thoroughly rinse your body with warm water from the neck down.  8. DO NOT shower/wash with your normal soap after using and rinsing off the CHG Soap.  9. Pat yourself dry with a CLEAN TOWEL.  10. Wear CLEAN PAJAMAS to bed the night before surgery  11. Place CLEAN SHEETS on your bed the night of your first shower and DO NOT SLEEP WITH PETS.  Day of Surgery: Wear Clean/Comfortable clothing the morning of surgery Do not apply any deodorants/lotions.   Remember to brush your teeth WITH YOUR REGULAR TOOTHPASTE.   Please read over the following fact sheets that you were given.

## 2020-02-11 NOTE — Progress Notes (Signed)
[place] DEVICE PROGRAMMING  Patient Information: Name:  Daniel Reyes  DOB:  04-17-1946  MRN:  [id number]   Device Information:  Clinic EP Physician:  Cristopher Peru, MD   Device Type:  Pacemaker Manufacturer and Phone #:  [employer]: [phone] Pacemaker Dependent?: Unknown Date of Last Device Check:  overdue  Normal Device Function?: Industry to interrogate before procedure for recommendations.  Electrophysiologist's Recommendations:   Patient overdue for follow-up. Industry will need to interrogate device and give recommendations prior to procedure.  Per Device Clinic Standing Orders, Drake Leach, RN  1:17 PM 02/11/2020

## 2020-02-11 NOTE — Progress Notes (Addendum)
Periop device orders form sent via In-Basket-Message to device clinic, Maryjean Ka CC'd on message, and Heard notified via telephone about patient's upcoming procedure.

## 2020-02-12 ENCOUNTER — Other Ambulatory Visit (HOSPITAL_COMMUNITY)
Admission: RE | Admit: 2020-02-12 | Discharge: 2020-02-12 | Disposition: A | Discharge status: Home / Self Care | Payer: Medicare HMO | Source: Ambulatory Visit | Attending: Neurosurgery | Admitting: Neurosurgery

## 2020-02-12 ENCOUNTER — Encounter (HOSPITAL_COMMUNITY): Payer: Self-pay

## 2020-02-12 ENCOUNTER — Encounter (HOSPITAL_COMMUNITY)
Admission: RE | Admit: 2020-02-12 | Discharge: 2020-02-12 | Disposition: A | Discharge status: Home / Self Care | Payer: Medicare HMO | Source: Ambulatory Visit | Attending: Neurosurgery | Admitting: Neurosurgery

## 2020-02-12 ENCOUNTER — Other Ambulatory Visit: Payer: Self-pay

## 2020-02-12 DIAGNOSIS — Z01810 Encounter for preprocedural cardiovascular examination: Secondary | ICD-10-CM | POA: Insufficient documentation

## 2020-02-12 DIAGNOSIS — I2510 Atherosclerotic heart disease of native coronary artery without angina pectoris: Secondary | ICD-10-CM | POA: Diagnosis not present

## 2020-02-12 DIAGNOSIS — Z01812 Encounter for preprocedural laboratory examination: Secondary | ICD-10-CM | POA: Insufficient documentation

## 2020-02-12 DIAGNOSIS — I4510 Unspecified right bundle-branch block: Secondary | ICD-10-CM | POA: Insufficient documentation

## 2020-02-12 DIAGNOSIS — R0902 Hypoxemia: Secondary | ICD-10-CM | POA: Diagnosis not present

## 2020-02-12 DIAGNOSIS — Z950 Presence of cardiac pacemaker: Secondary | ICD-10-CM | POA: Insufficient documentation

## 2020-02-12 DIAGNOSIS — Z79899 Other long term (current) drug therapy: Secondary | ICD-10-CM | POA: Diagnosis not present

## 2020-02-12 DIAGNOSIS — Z20822 Contact with and (suspected) exposure to covid-19: Secondary | ICD-10-CM | POA: Insufficient documentation

## 2020-02-12 DIAGNOSIS — D352 Benign neoplasm of pituitary gland: Secondary | ICD-10-CM | POA: Diagnosis not present

## 2020-02-12 DIAGNOSIS — Z7951 Long term (current) use of inhaled steroids: Secondary | ICD-10-CM | POA: Diagnosis not present

## 2020-02-12 DIAGNOSIS — E785 Hyperlipidemia, unspecified: Secondary | ICD-10-CM | POA: Diagnosis not present

## 2020-02-12 HISTORY — DX: Dyspnea, unspecified: R06.00

## 2020-02-12 HISTORY — DX: Gastro-esophageal reflux disease without esophagitis: K21.9

## 2020-02-12 LAB — CBC
HCT: 41.6 % (ref 39.0–52.0)
Hemoglobin: 13.6 g/dL (ref 13.0–17.0)
MCH: 28.2 pg (ref 26.0–34.0)
MCHC: 32.7 g/dL (ref 30.0–36.0)
MCV: 86.1 fL (ref 80.0–100.0)
Platelets: 238 10*3/uL (ref 150–400)
RBC: 4.83 MIL/uL (ref 4.22–5.81)
RDW: 13.4 % (ref 11.5–15.5)
WBC: 5.8 10*3/uL (ref 4.0–10.5)
nRBC: 0 % (ref 0.0–0.2)

## 2020-02-12 LAB — TYPE AND SCREEN
ABO/RH(D): A POS
Antibody Screen: NEGATIVE

## 2020-02-12 LAB — SARS CORONAVIRUS 2 (TAT 6-24 HRS): SARS Coronavirus 2: NEGATIVE

## 2020-02-12 NOTE — Progress Notes (Signed)
PCP - Dr. Penni Homans Cardiologist - Dr. Cristopher Peru  PPM/ICD - Pacemaker Device Orders - None as of yet. Rep Notified -  N/A  Chest x-ray - N/A EKG - 02/11/20 Stress Test - 01/31/17 ECHO - 01/31/17 Cardiac Cath - 02/09/17  Sleep Study - Denies  Pt denies being diabetic   Blood Thinner Instructions: N/A Aspirin Instructions: N/A  ERAS Protcol - No  COVID TEST- 02/12/20   Anesthesia review: Cardiac hx; pacemaker   Patient denies shortness of breath, fever, cough and chest pain at PAT appointment   All instructions explained to the patient, with a verbal understanding of the material. Patient agrees to go over the instructions while at home for a better understanding. Patient also instructed to self quarantine after being tested for COVID-19. The opportunity to ask questions was provided.

## 2020-02-13 NOTE — Anesthesia Preprocedure Evaluation (Addendum)
Anesthesia Evaluation   Patient awake    Reviewed: Allergy & Precautions, NPO status , Patient's Chart, lab work & pertinent test results  History of Anesthesia Complications Negative for: history of anesthetic complications  Airway Mallampati: III  TM Distance: >3 FB Neck ROM: Full    Dental  (+) Dental Advisory Given, Teeth Intact   Pulmonary asthma ,    breath sounds clear to auscultation       Cardiovascular (-) hypertension(-) angina+ CAD and + DOE  (-) Cardiac Stents, (-) CABG and (-) CHF + dysrhythmias + pacemaker  Rhythm:Regular  2018 tte: Left ventricle: The cavity size was normal. Wall thickness was  normal. Systolic function was normal. The estimated ejection  fraction was in the range of 55% to 60%. Wall motion was normal;  there were no regional wall motion abnormalities. Features are  consistent with a pseudonormal left ventricular filling pattern,  with concomitant abnormal relaxation and increased filling  pressure (grade 2 diastolic dysfunction).   2018 stress: neg  2018 cath:   Mild to moderate proximal and mid LAD plaque with less than 40% narrowing  Normal ramus intermedius.  Normal small circumflex.  Dominant right coronary with luminal irregularities.  Normal left ventricular size and function. EF 60% with normal in diastolic pressure.  Normal pulmonary pressures.    Neuro/Psych Pituitary tumor negative psych ROS   GI/Hepatic negative GI ROS, Neg liver ROS,   Endo/Other  negative endocrine ROS  Renal/GU negative Renal ROS     Musculoskeletal negative musculoskeletal ROS (+)   Abdominal   Peds  Hematology negative hematology ROS (+) Lab Results      Component                Value               Date                      WBC                      5.8                 02/12/2020                HGB                      13.6                02/12/2020                HCT                       41.6                02/12/2020                MCV                      86.1                02/12/2020                PLT                      238                 02/12/2020  Anesthesia Other Findings   Reproductive/Obstetrics                            Anesthesia Physical Anesthesia Plan  ASA: II  Anesthesia Plan: General   Post-op Pain Management:    Induction: Intravenous  PONV Risk Score and Plan: 2 and Ondansetron, Dexamethasone and Propofol infusion  Airway Management Planned: Oral ETT  Additional Equipment: Arterial line  Intra-op Plan:   Post-operative Plan: Extubation in OR  Informed Consent: I have reviewed the patients History and Physical, chart, labs and discussed the procedure including the risks, benefits and alternatives for the proposed anesthesia with the patient or authorized representative who has indicated his/her understanding and acceptance.     Dental advisory given  Plan Discussed with: CRNA and Surgeon  Anesthesia Plan Comments: (PAT note by Karoline Caldwell, PA-C: Follows with pulmonology for history of mild OSA on CPAP (with intermittent compliance), mild intermittent asthma maintained on Breo 100 and as needed albuterol.  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  He is status post implantation of [employer] dual-chamber PPM on 05/15/2018 by Dr. Lovena Le.  Unfortunately, patient has not been seen in follow-up by cardiology since implantation of pacemaker.  He is followed regularly with his primary care doctor Carollee Herter. Last remote device check 11/25/2019 showed histograms appropriate, leads and battery stable, no recommended changes.    Preop CBC reviewed, WNL.  For unclear reasons he did not have a bmet drawn at preop appointment.  This will need to be done day of surgery.  Device orders state the patient is  overdue for follow-up and will need to be interrogated by industry prior to surgery.  This was communicated to Particia Lather, RN. Device Information:  Clinic EP Physician:  Cristopher Peru, MD   Device Type:  Pacemaker Manufacturer and Phone #:  [employer]: [phone] Pacemaker Dependent?: Unknown Date of Last Device Check:  overdue         Normal Device Function?: Industry to interrogate before procedure for recommendations.  Electrophysiologist's Recommendations:  Patient overdue for follow-up. Industry will need to interrogate device and give recommendations prior to procedure.   EKG 02/12/2020: Atrial-paced rhythm with occasional AV dual-paced complexes. Low voltage QRS. 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 There is chiasmatic contact; no cavernous sinus invasion.  Right/left heart cath 02/09/2017: Mild to moderate proximal and mid LAD plaque with less than 40% narrowing Normal ramus intermedius. Normal small circumflex. Dominant right coronary with luminal irregularities. Normal left ventricular size and function. EF 60% with normal in diastolic pressure. Normal pulmonary pressures.  RECOMMENDATIONS:  No explanation for the patient's symptoms. 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  TTE 01/31/2017: - Left ventricle: The cavity size was normal. Wall thickness was  normal. Systolic function was normal. The estimated ejection  fraction was in the range of 55% to 60%. Wall motion was normal;  there were no regional wall motion abnormalities. Features are  consistent with a pseudonormal left ventricular filling pattern,  with concomitant abnormal relaxation and increased filling  pressure (grade 2 diastolic dysfunction).   )       Anesthesia Quick Evaluation

## 2020-02-13 NOTE — Progress Notes (Signed)
Anesthesia Chart Review:  Follows with pulmonology for history of mild OSA on CPAP (with intermittent compliance), mild intermittent asthma maintained on Breo 100 and as needed albuterol.  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  He is status post implantation of [employer] dual-chamber PPM on 05/15/2018 by Dr. Lovena Le.  Unfortunately, patient has not been seen in follow-up by cardiology since implantation of pacemaker.  He is followed regularly with his primary care doctor Carollee Herter. Last remote device check 11/25/2019 showed histograms appropriate, leads and battery stable, no recommended changes.    Preop CBC reviewed, WNL.  For unclear reasons he did not have a bmet drawn at preop appointment.  This will need to be done day of surgery.  Device orders state the patient is overdue for follow-up and will need to be interrogated by industry prior to surgery.  This was communicated to Particia Lather, RN. Device Information:  Clinic EP Physician:  Cristopher Peru, MD   Device Type:  Pacemaker Manufacturer and Phone #:  [employer]: [phone] Pacemaker Dependent?: Unknown Date of Last Device Check:  overdue         Normal Device Function?: Industry to interrogate before procedure for recommendations.  Electrophysiologist's Recommendations:   Patient overdue for follow-up. Industry will need to interrogate device and give recommendations prior to procedure.   EKG 02/12/2020: Atrial-paced rhythm with occasional AV dual-paced complexes. Low voltage QRS. 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 There is chiasmatic contact; no cavernous sinus invasion.  Right/left heart cath 02/09/2017:  Mild to moderate proximal and mid LAD plaque with less than 40% narrowing  Normal ramus  intermedius.  Normal small circumflex.  Dominant right coronary with luminal irregularities.  Normal left ventricular size and function. EF 60% with normal in diastolic pressure.  Normal pulmonary pressures.  RECOMMENDATIONS:   No explanation for the patient's symptoms.  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  TTE 01/31/2017: - Left ventricle: The cavity size was normal. Wall thickness was  normal. Systolic function was normal. The estimated ejection  fraction was in the range of 55% to 60%. Wall motion was normal;  there were no regional wall motion abnormalities. Features are  consistent with a pseudonormal left ventricular filling pattern,  with concomitant abnormal relaxation and increased filling  pressure (grade 2 diastolic dysfunction).    Wynonia Musty [facility] Short Stay Center/Anesthesiology Phone [phone] 02/13/2020 10:01 AM

## 2020-02-14 ENCOUNTER — Encounter (HOSPITAL_COMMUNITY)
Admission: RE | Disposition: A | Discharge status: Home / Self Care | Payer: Self-pay | Source: Home / Self Care | Attending: Neurosurgery

## 2020-02-14 ENCOUNTER — Other Ambulatory Visit: Payer: Self-pay

## 2020-02-14 ENCOUNTER — Inpatient Hospital Stay (HOSPITAL_COMMUNITY): Payer: Medicare HMO | Admitting: Physician Assistant

## 2020-02-14 ENCOUNTER — Encounter (HOSPITAL_COMMUNITY): Payer: Self-pay | Admitting: Neurosurgery

## 2020-02-14 ENCOUNTER — Inpatient Hospital Stay (HOSPITAL_COMMUNITY)
Admission: RE | Admit: 2020-02-14 | Discharge: 2020-02-17 | DRG: 615 | Disposition: A | Discharge status: Home / Self Care | Payer: Medicare HMO | Attending: Neurosurgery | Admitting: Neurosurgery

## 2020-02-14 DIAGNOSIS — J45909 Unspecified asthma, uncomplicated: Secondary | ICD-10-CM | POA: Diagnosis not present

## 2020-02-14 DIAGNOSIS — Z79899 Other long term (current) drug therapy: Secondary | ICD-10-CM

## 2020-02-14 DIAGNOSIS — Z20822 Contact with and (suspected) exposure to covid-19: Secondary | ICD-10-CM | POA: Diagnosis present

## 2020-02-14 DIAGNOSIS — E893 Postprocedural hypopituitarism: Secondary | ICD-10-CM

## 2020-02-14 DIAGNOSIS — E785 Hyperlipidemia, unspecified: Secondary | ICD-10-CM | POA: Diagnosis not present

## 2020-02-14 DIAGNOSIS — I2510 Atherosclerotic heart disease of native coronary artery without angina pectoris: Secondary | ICD-10-CM | POA: Diagnosis not present

## 2020-02-14 DIAGNOSIS — E236 Other disorders of pituitary gland: Secondary | ICD-10-CM | POA: Diagnosis not present

## 2020-02-14 DIAGNOSIS — Z7951 Long term (current) use of inhaled steroids: Secondary | ICD-10-CM | POA: Diagnosis not present

## 2020-02-14 DIAGNOSIS — D497 Neoplasm of unspecified behavior of endocrine glands and other parts of nervous system: Secondary | ICD-10-CM | POA: Diagnosis present

## 2020-02-14 DIAGNOSIS — D352 Benign neoplasm of pituitary gland: Secondary | ICD-10-CM | POA: Diagnosis not present

## 2020-02-14 DIAGNOSIS — Z950 Presence of cardiac pacemaker: Secondary | ICD-10-CM

## 2020-02-14 DIAGNOSIS — R0902 Hypoxemia: Secondary | ICD-10-CM | POA: Diagnosis present

## 2020-02-14 HISTORY — PX: CRANIOTOMY: SHX93

## 2020-02-14 HISTORY — PX: TRANSPHENOIDAL APPROACH EXPOSURE: SHX6311

## 2020-02-14 LAB — BASIC METABOLIC PANEL
Anion gap: 12 (ref 5–15)
BUN: 26 mg/dL — ABNORMAL HIGH (ref 8–23)
CO2: 19 mmol/L — ABNORMAL LOW (ref 22–32)
Calcium: 9 mg/dL (ref 8.9–10.3)
Chloride: 107 mmol/L (ref 98–111)
Creatinine, Ser: 1.23 mg/dL (ref 0.61–1.24)
GFR calc Af Amer: >60 mL/min (ref >60)
GFR calc non Af Amer: 58 mL/min — ABNORMAL LOW (ref >60)
Glucose, Bld: 100 mg/dL — ABNORMAL HIGH (ref 70–99)
Potassium: 4.3 mmol/L (ref 3.5–5.1)
Sodium: 138 mmol/L (ref 135–145)

## 2020-02-14 LAB — ABO/RH: ABO/RH(D): A POS

## 2020-02-14 SURGERY — CRANIOTOMY HYPOPHYSECTOMY TRANSNASAL APPROACH
Anesthesia: General | Site: Nose

## 2020-02-14 MED ORDER — ONDANSETRON HCL 4 MG/2ML IJ SOLN: 4.0000 mg | INTRAMUSCULAR | Status: DC | PRN

## 2020-02-14 MED ORDER — LIDOCAINE-EPINEPHRINE 1 %-1:100000 IJ SOLN
INTRAMUSCULAR | Status: AC
  Filled 2020-02-14: qty 1

## 2020-02-14 MED ORDER — LABETALOL HCL 5 MG/ML IV SOLN: 10.0000 mg | INTRAVENOUS | Status: DC | PRN

## 2020-02-14 MED ORDER — PROMETHAZINE HCL 12.5 MG PO TABS
12.5000 mg | ORAL_TABLET | ORAL | Status: DC | PRN
  Filled 2020-02-14: qty 2

## 2020-02-14 MED ORDER — PROPOFOL 10 MG/ML IV BOLUS
INTRAVENOUS | Status: AC
  Filled 2020-02-14: qty 20

## 2020-02-14 MED ORDER — OXYMETAZOLINE HCL 0.05 % NA SOLN
NASAL | Status: AC
  Filled 2020-02-14: qty 30

## 2020-02-14 MED ORDER — CEFAZOLIN SODIUM-DEXTROSE 2-4 GM/100ML-% IV SOLN
2.0000 g | INTRAVENOUS | Status: AC
  Administered 2020-02-14: 2 g via INTRAVENOUS
  Filled 2020-02-14: qty 100

## 2020-02-14 MED ORDER — HYDROCODONE-ACETAMINOPHEN 5-325 MG PO TABS
ORAL_TABLET | ORAL | Status: AC
  Filled 2020-02-14: qty 1

## 2020-02-14 MED ORDER — ORAL CARE MOUTH RINSE: 15.0000 mL | Freq: Once | OROMUCOSAL | Status: AC

## 2020-02-14 MED ORDER — SODIUM CHLORIDE 0.9 % IV SOLN: INTRAVENOUS | Status: DC | PRN

## 2020-02-14 MED ORDER — ALBUTEROL SULFATE HFA 108 (90 BASE) MCG/ACT IN AERS: 2.0000 | INHALATION_SPRAY | Freq: Four times a day (QID) | RESPIRATORY_TRACT | Status: DC | PRN

## 2020-02-14 MED ORDER — SODIUM CHLORIDE 0.9 % IV SOLN
0.0125 ug/kg/min | INTRAVENOUS | Status: DC
  Administered 2020-02-14: .1 ug/kg/min via INTRAVENOUS
  Filled 2020-02-14: qty 2000

## 2020-02-14 MED ORDER — FENTANYL CITRATE (PF) 100 MCG/2ML IJ SOLN
INTRAMUSCULAR | Status: DC | PRN
Start: 2020-02-14 — End: 2020-02-14
  Administered 2020-02-14: 50 ug via INTRAVENOUS
  Administered 2020-02-14: 25 ug via INTRAVENOUS
  Administered 2020-02-14: 100 ug via INTRAVENOUS
  Administered 2020-02-14: 75 ug via INTRAVENOUS

## 2020-02-14 MED ORDER — ACETAMINOPHEN 10 MG/ML IV SOLN
INTRAVENOUS | Status: AC
  Filled 2020-02-14: qty 100

## 2020-02-14 MED ORDER — PHENYLEPHRINE HCL-NACL 10-0.9 MG/250ML-% IV SOLN
INTRAVENOUS | Status: DC | PRN
  Administered 2020-02-14: 25 ug/min via INTRAVENOUS

## 2020-02-14 MED ORDER — LABETALOL HCL 5 MG/ML IV SOLN
INTRAVENOUS | Status: AC
  Administered 2020-02-14: 10 mg via INTRAVENOUS
  Filled 2020-02-14: qty 4

## 2020-02-14 MED ORDER — FENTANYL CITRATE (PF) 100 MCG/2ML IJ SOLN: 25.0000 ug | INTRAMUSCULAR | Status: DC | PRN

## 2020-02-14 MED ORDER — SUGAMMADEX SODIUM 200 MG/2ML IV SOLN
INTRAVENOUS | Status: DC | PRN
  Administered 2020-02-14: 200 mg via INTRAVENOUS

## 2020-02-14 MED ORDER — LEVETIRACETAM IN NACL 500 MG/100ML IV SOLN
500.0000 mg | Freq: Two times a day (BID) | INTRAVENOUS | Status: DC
  Administered 2020-02-14 – 2020-02-16 (×4): 500 mg via INTRAVENOUS
  Filled 2020-02-14 (×5): qty 100

## 2020-02-14 MED ORDER — CHLORHEXIDINE GLUCONATE CLOTH 2 % EX PADS: 6.0000 | MEDICATED_PAD | Freq: Once | CUTANEOUS | Status: DC

## 2020-02-14 MED ORDER — CEFAZOLIN SODIUM-DEXTROSE 1-4 GM/50ML-% IV SOLN
1.0000 g | Freq: Four times a day (QID) | INTRAVENOUS | Status: AC
  Administered 2020-02-14 – 2020-02-15 (×3): 1 g via INTRAVENOUS
  Filled 2020-02-14 (×3): qty 50

## 2020-02-14 MED ORDER — OXYMETAZOLINE HCL 0.05 % NA SOLN
NASAL | Status: DC | PRN
  Administered 2020-02-14: 1 via TOPICAL

## 2020-02-14 MED ORDER — LACTATED RINGERS IV SOLN: INTRAVENOUS | Status: DC

## 2020-02-14 MED ORDER — DEXAMETHASONE SODIUM PHOSPHATE 10 MG/ML IJ SOLN
INTRAMUSCULAR | Status: DC | PRN
  Administered 2020-02-14: 10 mg via INTRAVENOUS

## 2020-02-14 MED ORDER — OXYCODONE HCL 5 MG/5ML PO SOLN: 5.0000 mg | Freq: Once | ORAL | Status: AC | PRN

## 2020-02-14 MED ORDER — ADULT MULTIVITAMIN W/MINERALS CH
1.0000 | ORAL_TABLET | Freq: Every day | ORAL | Status: DC
  Administered 2020-02-15 – 2020-02-17 (×3): 1 via ORAL
  Filled 2020-02-14 (×3): qty 1

## 2020-02-14 MED ORDER — CEPHALEXIN 500 MG PO CAPS
500.0000 mg | ORAL_CAPSULE | Freq: Three times a day (TID) | ORAL | Status: DC
  Administered 2020-02-15 – 2020-02-17 (×6): 500 mg via ORAL
  Filled 2020-02-14 (×7): qty 1

## 2020-02-14 MED ORDER — CHLORHEXIDINE GLUCONATE CLOTH 2 % EX PADS
6.0000 | MEDICATED_PAD | Freq: Every day | CUTANEOUS | Status: DC
  Administered 2020-02-14: 6 via TOPICAL

## 2020-02-14 MED ORDER — ROCURONIUM BROMIDE 10 MG/ML (PF) SYRINGE
PREFILLED_SYRINGE | INTRAVENOUS | Status: DC | PRN
  Administered 2020-02-14: 70 mg via INTRAVENOUS
  Administered 2020-02-14: 30 mg via INTRAVENOUS
  Administered 2020-02-14: 20 mg via INTRAVENOUS

## 2020-02-14 MED ORDER — ONDANSETRON HCL 4 MG PO TABS: 4.0000 mg | ORAL_TABLET | ORAL | Status: DC | PRN

## 2020-02-14 MED ORDER — ONDANSETRON HCL 4 MG/2ML IJ SOLN
INTRAMUSCULAR | Status: DC | PRN
  Administered 2020-02-14: 4 mg via INTRAVENOUS

## 2020-02-14 MED ORDER — ONDANSETRON HCL 4 MG/2ML IJ SOLN
INTRAMUSCULAR | Status: AC
  Filled 2020-02-14: qty 2

## 2020-02-14 MED ORDER — HEMOSTATIC AGENTS (NO CHARGE) OPTIME
TOPICAL | Status: DC | PRN
  Administered 2020-02-14: 1 via TOPICAL

## 2020-02-14 MED ORDER — HYDROCODONE-ACETAMINOPHEN 5-325 MG PO TABS: 1.0000 | ORAL_TABLET | ORAL | Status: DC | PRN

## 2020-02-14 MED ORDER — OXYCODONE HCL 5 MG PO TABS
ORAL_TABLET | ORAL | Status: AC
  Administered 2020-02-14: 5 mg via ORAL
  Filled 2020-02-14: qty 1

## 2020-02-14 MED ORDER — ROCURONIUM BROMIDE 10 MG/ML (PF) SYRINGE
PREFILLED_SYRINGE | INTRAVENOUS | Status: AC
  Filled 2020-02-14: qty 10

## 2020-02-14 MED ORDER — ALBUTEROL SULFATE (2.5 MG/3ML) 0.083% IN NEBU: 2.5000 mg | INHALATION_SOLUTION | Freq: Four times a day (QID) | RESPIRATORY_TRACT | Status: DC | PRN

## 2020-02-14 MED ORDER — PANTOPRAZOLE SODIUM 40 MG IV SOLR
40.0000 mg | Freq: Every day | INTRAVENOUS | Status: DC
  Administered 2020-02-14 – 2020-02-16 (×3): 40 mg via INTRAVENOUS
  Filled 2020-02-14 (×3): qty 40

## 2020-02-14 MED ORDER — POTASSIUM CHLORIDE IN NACL 20-0.9 MEQ/L-% IV SOLN
INTRAVENOUS | Status: DC
  Filled 2020-02-14 (×2): qty 1000

## 2020-02-14 MED ORDER — DEXAMETHASONE SODIUM PHOSPHATE 10 MG/ML IJ SOLN
INTRAMUSCULAR | Status: AC
  Filled 2020-02-14: qty 1

## 2020-02-14 MED ORDER — MORPHINE SULFATE (PF) 2 MG/ML IV SOLN: 2.0000 mg | INTRAVENOUS | Status: DC | PRN

## 2020-02-14 MED ORDER — FENTANYL CITRATE (PF) 100 MCG/2ML IJ SOLN
INTRAMUSCULAR | Status: AC
  Administered 2020-02-14: 50 ug via INTRAVENOUS
  Filled 2020-02-14: qty 2

## 2020-02-14 MED ORDER — 0.9 % SODIUM CHLORIDE (POUR BTL) OPTIME
TOPICAL | Status: DC | PRN
  Administered 2020-02-14: 1000 mL

## 2020-02-14 MED ORDER — NALOXONE HCL 0.4 MG/ML IJ SOLN: 0.0800 mg | INTRAMUSCULAR | Status: DC | PRN

## 2020-02-14 MED ORDER — VITAMIN D 25 MCG (1000 UNIT) PO TABS
1000.0000 [IU] | ORAL_TABLET | Freq: Every day | ORAL | Status: DC
  Administered 2020-02-15 – 2020-02-17 (×3): 1000 [IU] via ORAL
  Filled 2020-02-14 (×3): qty 1

## 2020-02-14 MED ORDER — ATORVASTATIN CALCIUM 10 MG PO TABS
20.0000 mg | ORAL_TABLET | Freq: Every day | ORAL | Status: DC
  Administered 2020-02-15 – 2020-02-17 (×3): 20 mg via ORAL
  Filled 2020-02-14 (×3): qty 2

## 2020-02-14 MED ORDER — SODIUM CHLORIDE 0.9 % IR SOLN
Status: DC | PRN
  Administered 2020-02-14 (×2): 1000 mL

## 2020-02-14 MED ORDER — FENTANYL CITRATE (PF) 250 MCG/5ML IJ SOLN
INTRAMUSCULAR | Status: AC
  Filled 2020-02-14: qty 5

## 2020-02-14 MED ORDER — MICROFIBRILLAR COLL HEMOSTAT EX PADS
MEDICATED_PAD | CUTANEOUS | Status: DC | PRN
  Administered 2020-02-14: 1 via TOPICAL

## 2020-02-14 MED ORDER — MUPIROCIN CALCIUM 2 % EX CREA
TOPICAL_CREAM | CUTANEOUS | Status: DC | PRN
  Administered 2020-02-14: 1 via TOPICAL

## 2020-02-14 MED ORDER — ACETAMINOPHEN 160 MG/5ML PO SOLN: 1000.0000 mg | Freq: Once | ORAL | Status: DC | PRN

## 2020-02-14 MED ORDER — FLUTICASONE FUROATE-VILANTEROL 100-25 MCG/INH IN AEPB
1.0000 | INHALATION_SPRAY | Freq: Every day | RESPIRATORY_TRACT | Status: DC
  Filled 2020-02-14: qty 28

## 2020-02-14 MED ORDER — LIDOCAINE 2% (20 MG/ML) 5 ML SYRINGE
INTRAMUSCULAR | Status: AC
  Filled 2020-02-14: qty 5

## 2020-02-14 MED ORDER — LIDOCAINE-EPINEPHRINE 1 %-1:100000 IJ SOLN
INTRAMUSCULAR | Status: DC | PRN
  Administered 2020-02-14: 8 mL

## 2020-02-14 MED ORDER — OXYCODONE HCL 5 MG PO TABS: 5.0000 mg | ORAL_TABLET | Freq: Once | ORAL | Status: AC | PRN

## 2020-02-14 MED ORDER — ACETAMINOPHEN 10 MG/ML IV SOLN
1000.0000 mg | Freq: Once | INTRAVENOUS | Status: DC | PRN
  Administered 2020-02-14: 1000 mg via INTRAVENOUS

## 2020-02-14 MED ORDER — CHLORHEXIDINE GLUCONATE 0.12 % MT SOLN
15.0000 mL | Freq: Once | OROMUCOSAL | Status: AC
  Administered 2020-02-14: 15 mL via OROMUCOSAL
  Filled 2020-02-14: qty 15

## 2020-02-14 MED ORDER — ACETAMINOPHEN-CODEINE #3 300-30 MG PO TABS
1.0000 | ORAL_TABLET | ORAL | Status: DC | PRN
  Administered 2020-02-15 – 2020-02-17 (×10): 1 via ORAL
  Filled 2020-02-14 (×10): qty 1

## 2020-02-14 MED ORDER — SALINE SPRAY 0.65 % NA SOLN
4.0000 | NASAL | Status: DC | PRN
  Filled 2020-02-14 (×3): qty 44

## 2020-02-14 MED ORDER — MUPIROCIN 2 % EX OINT
TOPICAL_OINTMENT | CUTANEOUS | Status: AC
  Filled 2020-02-14: qty 22

## 2020-02-14 MED ORDER — ACETAMINOPHEN 500 MG PO TABS: 1000.0000 mg | ORAL_TABLET | Freq: Once | ORAL | Status: DC | PRN

## 2020-02-14 MED ORDER — LIDOCAINE 2% (20 MG/ML) 5 ML SYRINGE
INTRAMUSCULAR | Status: DC | PRN
  Administered 2020-02-14: 60 mg via INTRAVENOUS

## 2020-02-14 MED ORDER — PROPOFOL 10 MG/ML IV BOLUS
INTRAVENOUS | Status: DC | PRN
  Administered 2020-02-14: 50 mg via INTRAVENOUS
  Administered 2020-02-14: 100 mg via INTRAVENOUS
  Administered 2020-02-14 (×3): 50 mg via INTRAVENOUS

## 2020-02-14 SURGICAL SUPPLY — 100 items
BAND RUBBER #18 3X1/16 STRL (MISCELLANEOUS) IMPLANT
BENZOIN TINCTURE PRP APPL 2/3 (GAUZE/BANDAGES/DRESSINGS) IMPLANT
BLADE EYE SICKLE 84 5 BEAV (BLADE) IMPLANT
BLADE RAD40 ROTATE 4M 4 5PK (BLADE) IMPLANT
BLADE ROTATE TRICUT 4X13 M4 (BLADE) IMPLANT
BLADE SURG 15 STRL LF DISP TIS (BLADE) ×1 IMPLANT
BLADE SURG 15 STRL SS (BLADE) ×1
BUR DIAMOND 13X5 70D (BURR) IMPLANT
BUR DIAMOND CURV 15X5 15D (BURR) IMPLANT
BUR MATCHSTICK NEURO 3.0 LAGG (BURR) IMPLANT
BUR TAPER CHOANAL ATRESIA 30K (BURR) ×2 IMPLANT
CANISTER SUCT 3000ML PPV (MISCELLANEOUS) ×4 IMPLANT
CARTRIDGE OIL MAESTRO DRILL (MISCELLANEOUS) IMPLANT
CATH ROBINSON RED A/P 14FR (CATHETERS) IMPLANT
CNTNR URN SCR LID CUP LEK RST (MISCELLANEOUS) ×1 IMPLANT
COAGULATOR SUCT 8FR VV (MISCELLANEOUS) ×2 IMPLANT
CONT SPEC 4OZ STRL OR WHT (MISCELLANEOUS) ×1
COTTONBALL LRG STERILE PKG (GAUZE/BANDAGES/DRESSINGS) IMPLANT
COVER WAND RF STERILE (DRAPES) IMPLANT
DECANTER SPIKE VIAL GLASS SM (MISCELLANEOUS) ×2 IMPLANT
DIFFUSER DRILL AIR PNEUMATIC (MISCELLANEOUS) IMPLANT
DRAIN SUBARACHNOID (WOUND CARE) IMPLANT
DRAPE C-ARM 42X72 X-RAY (DRAPES) IMPLANT
DRAPE HALF SHEET 40X57 (DRAPES) ×2 IMPLANT
DRAPE INCISE IOBAN 66X45 STRL (DRAPES) ×2 IMPLANT
DRAPE MICROSCOPE LEICA (MISCELLANEOUS) IMPLANT
DRESSING NASAL POPE 10X1.5X2.5 (GAUZE/BANDAGES/DRESSINGS) IMPLANT
DRSG NASAL POPE 10X1.5X2.5 (GAUZE/BANDAGES/DRESSINGS)
DRSG NASOPORE 8CM (GAUZE/BANDAGES/DRESSINGS) ×2 IMPLANT
DURAPREP 26ML APPLICATOR (WOUND CARE) ×2 IMPLANT
ELECT COATED BLADE 2.86 ST (ELECTRODE) IMPLANT
ELECT NEEDLE TIP 2.8 STRL (NEEDLE) IMPLANT
ELECT REM PT RETURN 9FT ADLT (ELECTROSURGICAL) ×2
ELECTRODE REM PT RTRN 9FT ADLT (ELECTROSURGICAL) ×1 IMPLANT
GAUZE PACKING FOLDED 2  STR (GAUZE/BANDAGES/DRESSINGS)
GAUZE PACKING FOLDED 2 STR (GAUZE/BANDAGES/DRESSINGS) IMPLANT
GAUZE SPONGE 4X4 12PLY STRL (GAUZE/BANDAGES/DRESSINGS) IMPLANT
GLOVE BIO SURGEON STRL SZ7 (GLOVE) ×2 IMPLANT
GLOVE BIOGEL M 7.0 STRL (GLOVE) IMPLANT
GLOVE BIOGEL PI IND STRL 7.0 (GLOVE) ×1 IMPLANT
GLOVE BIOGEL PI IND STRL 8 (GLOVE) ×4 IMPLANT
GLOVE BIOGEL PI INDICATOR 7.0 (GLOVE) ×1
GLOVE BIOGEL PI INDICATOR 8 (GLOVE) ×4
GLOVE ECLIPSE 6.5 STRL STRAW (GLOVE) IMPLANT
GLOVE EXAM NITRILE XL STR (GLOVE) IMPLANT
GOWN STRL REUS W/ TWL LRG LVL3 (GOWN DISPOSABLE) ×3 IMPLANT
GOWN STRL REUS W/ TWL XL LVL3 (GOWN DISPOSABLE) IMPLANT
GOWN STRL REUS W/TWL 2XL LVL3 (GOWN DISPOSABLE) ×4 IMPLANT
GOWN STRL REUS W/TWL LRG LVL3 (GOWN DISPOSABLE) ×3
GOWN STRL REUS W/TWL XL LVL3 (GOWN DISPOSABLE)
HEMOSTAT SURGICEL 2X14 (HEMOSTASIS) ×2 IMPLANT
KIT BASIN OR (CUSTOM PROCEDURE TRAY) ×2 IMPLANT
KIT TURNOVER KIT B (KITS) ×2 IMPLANT
NEEDLE HYPO 25GX1X1/2 BEV (NEEDLE) IMPLANT
NEEDLE HYPO 25X1 1.5 SAFETY (NEEDLE) ×2 IMPLANT
NEEDLE SPNL 22GX3.5 QUINCKE BK (NEEDLE) ×2 IMPLANT
NEEDLE SPNL 25GX3.5 QUINCKE BL (NEEDLE) ×2 IMPLANT
NS IRRIG 1000ML POUR BTL (IV SOLUTION) ×2 IMPLANT
OIL CARTRIDGE MAESTRO DRILL (MISCELLANEOUS)
PACK LAMINECTOMY NEURO (CUSTOM PROCEDURE TRAY) IMPLANT
PAD ARMBOARD 7.5X6 YLW CONV (MISCELLANEOUS) ×6 IMPLANT
PATTIES SURGICAL .25X.25 (GAUZE/BANDAGES/DRESSINGS) IMPLANT
PATTIES SURGICAL .5 X3 (DISPOSABLE) IMPLANT
SHEATH ENDOSCRUB 0 DEG (SHEATH) ×2 IMPLANT
SHEATH ENDOSCRUB 30 DEG (SHEATH) IMPLANT
SHEATH ENDOSCRUB 45 DEG (SHEATH) IMPLANT
SPECIMEN JAR SMALL (MISCELLANEOUS) ×2 IMPLANT
SPLINT NASAL DOYLE BI-VL (GAUZE/BANDAGES/DRESSINGS) IMPLANT
SPONGE LAP 4X18 RFD (DISPOSABLE) IMPLANT
SPONGE NEURO XRAY DETECT 1X3 (DISPOSABLE) ×2 IMPLANT
SPONGE SURGIFOAM ABS GEL SZ50 (HEMOSTASIS) ×2 IMPLANT
STAPLER SKIN PROX WIDE 3.9 (STAPLE) IMPLANT
STRIP CLOSURE SKIN 1/2X4 (GAUZE/BANDAGES/DRESSINGS) IMPLANT
SUT 5.0 PDS RB-1 (SUTURE)
SUT ETHILON 3 0 FSL (SUTURE) IMPLANT
SUT ETHILON 3 0 PS 1 (SUTURE) IMPLANT
SUT ETHILON 6 0 P 1 (SUTURE) IMPLANT
SUT PDS AB 4-0 RB1 27 (SUTURE) IMPLANT
SUT PDS PLUS AB 5-0 RB-1 (SUTURE) IMPLANT
SUT PLAIN 4 0 ~~LOC~~ 1 (SUTURE) IMPLANT
SUT VIC AB 2-0 CT1 27 (SUTURE)
SUT VIC AB 2-0 CT1 27XBRD (SUTURE) IMPLANT
SUT VIC AB 2-0 CT2 18 VCP726D (SUTURE) IMPLANT
SUT VIC AB 3-0 SH 8-18 (SUTURE) IMPLANT
SUT VIC AB 4-0 P-3 18X BRD (SUTURE) IMPLANT
SUT VIC AB 4-0 P3 18 (SUTURE)
SYR 5ML LL (SYRINGE) IMPLANT
SYR BULB EAR ULCER 3OZ GRN STR (SYRINGE) ×2 IMPLANT
TOWEL GREEN STERILE (TOWEL DISPOSABLE) ×2 IMPLANT
TOWEL GREEN STERILE FF (TOWEL DISPOSABLE) ×2 IMPLANT
TRACKER ENT INSTRUMENT (MISCELLANEOUS) ×2 IMPLANT
TRACKER ENT PATIENT (MISCELLANEOUS) ×2 IMPLANT
TRAP SPECIMEN MUCUS 40CC (MISCELLANEOUS) ×2 IMPLANT
TRAY ENT MC OR (CUSTOM PROCEDURE TRAY) ×4 IMPLANT
TRAY FOLEY MTR SLVR 16FR STAT (SET/KITS/TRAYS/PACK) ×2 IMPLANT
TUBE CONNECTING 12X1/4 (SUCTIONS) ×2 IMPLANT
TUBING EXTENTION W/L.L. (IV SETS) ×2 IMPLANT
TUBING STRAIGHTSHOT EPS 5PK (TUBING) ×2 IMPLANT
UNDERPAD 30X36 HEAVY ABSORB (UNDERPADS AND DIAPERS) IMPLANT
WATER STERILE IRR 1000ML POUR (IV SOLUTION) ×2 IMPLANT

## 2020-02-14 NOTE — Anesthesia Procedure Notes (Signed)
Arterial Line Insertion Start/End9/08/2019 7:10 AM, 02/14/2020 7:25 AM Performed by: Wilburn Cornelia, CRNA, CRNA  Preanesthetic checklist: patient identified, risks and benefits discussed and pre-op evaluation Patient sedated Left, radial was placed Catheter size: 20 G Hand hygiene performed  and maximum sterile barriers used  Allen's test indicative of satisfactory collateral circulation Attempts: 2 Procedure performed without using ultrasound guided technique. Following insertion, dressing applied and Biopatch. Post procedure assessment: normal  Patient tolerated the procedure well with no immediate complications.

## 2020-02-14 NOTE — Anesthesia Postprocedure Evaluation (Signed)
Anesthesia Post Note  Patient: Daniel Reyes  Procedure(s) Performed: Transphenoidal resection of pituitary tumor (N/A Nose) TRANSPHENOIDAL APPROACH EXPOSURE (N/A Nose)     Patient location during evaluation: PACU Anesthesia Type: General Level of consciousness: awake and alert Pain management: pain level controlled Vital Signs Assessment: post-procedure vital signs reviewed and stable Respiratory status: spontaneous breathing, nonlabored ventilation, respiratory function stable and patient connected to nasal cannula oxygen Cardiovascular status: blood pressure returned to baseline and stable Postop Assessment: no apparent nausea or vomiting Anesthetic complications: no   No complications documented.  Last Vitals:  Vitals:   02/14/20 1235 02/14/20 1350  BP: (!) 151/74   Pulse: (!) 59   Resp: 12   Temp:  (!) 36.1 C  SpO2: 99%     Last Pain:  Vitals:   02/14/20 1335  TempSrc:   PainSc: 2                  Dawt Reeb

## 2020-02-14 NOTE — Consult Note (Signed)
   ENT Progress Note: Procedure(s): Transphenoidal resection of pituitary tumor TRANSPHENOIDAL APPROACH EXPOSURE   Subjective: Pt stable - no changes from pre-op eval  Objective: Vital signs in last 24 hours: Temp:  [98.1 F (36.7 C)] 98.1 F (36.7 C) (09/03 0555) Pulse Rate:  [60] 60 (09/03 0555) Resp:  [17] 17 (09/03 0555) BP: (165)/(81) 165/81 (09/03 0555) SpO2:  [98 %] 98 % (09/03 0555) Weight:  [88.5 kg] 88.5 kg (09/03 0555) Weight change:     Intake/Output from previous day: No intake/output data recorded. Intake/Output this shift: No intake/output data recorded.  Labs: Recent Labs    02/12/20 0826  WBC 5.8  HGB 13.6  HCT 41.6  PLT 238   No results for input(s): NA, K, CL, CO2, GLUCOSE, BUN, CALCIUM in the last 72 hours.  Invalid input(s): CREATININR  Studies/Results: No results found.   PHYSICAL EXAM: No change   Assessment/Plan: Pt for Endoscopic Trans-sphenoidal Pituitary resection with Dr. Zenaida Deed 02/14/2020, 7:32 AM

## 2020-02-14 NOTE — Op Note (Signed)
Operative Note:  ENDOSCOPIC TRANSSPHENOIDAL PITUITARY RESECTION WITH NAVIGATION      Patient: Daniel Reyes record number: [id number]  Date:02/14/2020  Pre-operative Indications: 1.  Pituitary Mass       Postoperative Indications: Same  Surgical Procedure: 1. Endoscopic transsphenoidal pituitary resection with intraoperative navigation    2.  Bilateral endoscopic sphenoidotomy      Anesthesia: GET  Surgeon: Delsa Bern, M.D.  Neurosurgeon: Dr. Christella Reyes  Complications: None  EBL: 100 cc  Findings: Large pituitary mass extending into the sphenoid sinus. Naso-pore sphenoid packing placed.  Note: The neurosurgical component of the operative procedure is dictated as a separate operative note.   Brief History: The patient is a 74 y.o. male with a history of pituitary mass. The patient has a history of headache and fatigue.  Patient worked up by Dr. Krista Blue at [facility], MRI scan showed large pituitary mass with enlargement of the pituitary fossa. The patient was referred to Dr. Christella Reyes for neurosurgical evaluation.  Patient seen by me at [facility] preoperatively with review of nasal anatomy and sinus CT scan for navigation.  Given the patient's history and findings, the above surgical procedures were recommended, risks and benefits were discussed in detail with the patient.  They understand and agree with our plan for surgery which is scheduled at [facility] under general anesthesia.  Surgical Procedure: The patient is brought to the neurosurgical operating room on 02/14/2020 and placed in supine position on the operating table. General endotracheal anesthesia was established without difficulty. When the patient was adequately anesthetized, surgical timeout was performed with correct identification of the patient and the surgical procedure. The patient's nose was then injected with 7 cc of 1% lidocaine 1:100,000 dilution epinephrine which was injected in a  submucosal fashion. The patient's nose was then packed with Afrin-soaked cottonoid pledgets were left in place for approximately 10 minutes to allow for vasoconstriction and hemostasis.  The Xomed Fusion navigation headgear was applied and anatomic and surgical landmarks were identified and confirmed, navigation was used throughout the sinus component of the surgical procedure.  With the patient prepped draped and prepared for surgery, nasal endoscopy was performed on the patient's right.  The middle turbinate was carefully lateralized to allow access to the posterior aspect of the nasal passageway.  The right sphenoid sinus ostium was identified.  The inferior aspect of the superior turbinate was then resected with through-cutting forceps and a microdebrider.  The right sphenoid sinus ostium was enlarged in a superior and lateral direction using the microdebrider and through-cutting forceps to create a widely patent ostium.  Nasal endoscopy on the patient's left-hand side was then undertaken.  The left middle turbinate was lateralized and the posterior nasal cavity was visualized with identification of the left sphenoid sinus ostium using navigation.  The inferior aspect of the superior turbinate was resected and the sinus ostium was enlarged in the lateral and superior direction to create a wide sphenoid sinus ostium.  A posterior septectomy was then performed with a Surveyor, quantity.  Bone, cartilage and soft tissue was then resected to create a wide posterior septotomy.  The anterior face of the sphenoid sinus and sphenoid sinus septum were then resected with a combination of through-cutting forceps, osteotome and microdebrider to allow direct access to the entire posterior aspect of the sphenoid sinus and pituitary fossa.  Sphenoid sinus mucosa overlying the pituitary fossa was elevated and lateralized.  The anterior face of the pituitary fossa was demarcated using  navigation.  With adequate access to the  pituitary fossa the neurosurgical component of the procedure was begun by Dr. Christella Reyes.  This is dictated as a separate operative report.  Resection of the pituitary tumor was undertaken using direct visualization of the 0 degree endoscope, navigation and blunt and sharp dissection.  With pituitary tumor resection completed, reconstruction was undertaken.  No evidence of significant bleeding or spinal fluid leak.  Surgicel was placed over the pituitary fossa defect and the sphenoid sinus was loosely packed with Naso-pore absorbable nasal packing.  There was no active bleeding and no evidence of spinal fluid leak.  The sphenoid sinus was carefully inspected, no further bleeding along the mucosal margins, sphenoidotomy sites or posterior septectomy.  The patient's nasal cavity was irrigated and suctioned.  Surgical sponge count was correct. An oral gastric tube was passed and the stomach contents were aspirated. Patient was awakened from anesthetic and transferred from the operating room to the recovery room in stable condition. There were no complications and blood loss was 100 cc.   Delsa Bern, M.D. [facility] 02/14/2020

## 2020-02-14 NOTE — Anesthesia Procedure Notes (Signed)
Procedure Name: Intubation Date/Time: 02/14/2020 7:52 AM Performed by: Barrington Ellison, CRNA Pre-anesthesia Checklist: Patient identified, Emergency Drugs available, Suction available and Patient being monitored Patient Re-evaluated:Patient Re-evaluated prior to induction Oxygen Delivery Method: Circle System Utilized Preoxygenation: Pre-oxygenation with 100% oxygen Induction Type: IV induction Ventilation: Mask ventilation without difficulty and Oral airway inserted - appropriate to patient size Laryngoscope Size: Mac and 4 Grade View: Grade II Tube type: Oral Tube size: 7.5 mm Number of attempts: 1 Airway Equipment and Method: Stylet and Oral airway Placement Confirmation: ETT inserted through vocal cords under direct vision,  positive ETCO2 and breath sounds checked- equal and bilateral Secured at: 23 cm Tube secured with: Tape Dental Injury: Teeth and Oropharynx as per pre-operative assessment

## 2020-02-14 NOTE — H&P (Signed)
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  He has had a history of fatigue, did have some visual field issues.  He was being seen by Dr. Krista Blue at [facility].  His initial visit with Dr. Krista Blue was spurred on by his evaluation of intermittent episodes of generalized fatigue.  He says in his words that he had sudden onset of extreme dizziness, fatigue.  He was seen by a cardiologist, had a stress test, noted to have bradycardia.  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  He had a PSX dual-chamber pacemaker placed in 2019 for the symptomatic bradycardia.  He has had visual field tests done by an ophthalmologist that have not shown any problems. He recently, in April, had a headache which was worse after he climbed a few stairs, 12, had sit down and felt lightheaded, but did not pass out.  He did have a low oxygen saturation of 88 as he has a home machine, but it did return to a normal value after a few minutes.     ALLERGIES :  He has an allergy to Imdur.     MEDICATIONS :  He currently takes Albuterol, Atorvastatin, Breo Ellipta inhaler, and a Multivitamin.     PAST MEDICAL HISTORY :  He has a history of hyperlipidemia, coronary artery disease, nocturnal hypoxia.  He has undergone lumbar surgery done by Dr. Ellene Route, cholecystectomy, cervical surgery.  He has had angiography, tonsillectomy, and adenoidectomy.     FAMILY HISTORY :  Includes heart disease, colon cancer, Alzheimer's, cerebrovascular attacks.     SOCIAL HISTORY :  He is married and does not live alone.     PHYSICAL EXAMINATION :  Vital Signs:  Weight 199 pounds.  Temperature is 97, blood pressure 150/92, pulse is 65.  Neurologic:  He is alert, oriented by 4. Answers all questions appropriately.  Memory,  language, attention span, and fund of knowledge are normal.  Speech is clear. It is also fluent. Hearing intact to voice.  He has full visual fields, full extraocular movements.  Tongue and uvula are in the midline.  Shoulder shrug is normal.  No drift.  2+ reflexes biceps, triceps, brachioradialis, knees, and ankles.  Romberg is negative.     IMAGING :  MRI is reviewed.  It shows a large pituitary adenoma.  It does abut the optic nerves.  It is fairly uniform in its appearance with contrast.  It is not invading the sinus, not invading the carotid at the region of the carotid artery. [name] returns after seeing the ophthalmologist and undergoing some lab tests.  Prolactin was normal.  TSH, FSH were also within normal range.  Growth hormone was normal also.  He speaks of significant and profound fatigue.  They gave me the example of leaving church this past weekend and he stated after getting in the driver's seat and driving just out of the parking lot that he was not going to be able to make at home.  He is frequently tired and essentially overall weak in the mornings.  He has never been evaluated by an endocrinologist.  He is seeing a host of other physicians, but has never been given an  answer nor has anyone been able to explain this to either his or his wife's satisfaction.  I believe the tumor needs to come out only because of its physical presence on the optic nerves.  But I also believe he needs an endocrinology exam after all is said and done.  Doing it now prior to the operation, would just kind of leave all of his findings moot. So therefore, I think it is best done after we do the procedure.  I will send him to an ENT and we will get this surgery scheduled as soon as possible.

## 2020-02-14 NOTE — Transfer of Care (Signed)
Immediate Anesthesia Transfer of Care Note  Patient: Daniel Reyes  Procedure(s) Performed: Transphenoidal resection of pituitary tumor (N/A Nose) TRANSPHENOIDAL APPROACH EXPOSURE (N/A Nose)  Patient Location: PACU  Anesthesia Type:General  Level of Consciousness: awake, alert  and oriented  Airway & Oxygen Therapy: Patient Spontanous Breathing  Post-op Assessment: Report given to RN and Patient moving all extremities X 4  Post vital signs: Reviewed and stable  Last Vitals:  Vitals Value Taken Time  BP 173/92 02/14/20 1021  Temp    Pulse 67 02/14/20 1021  Resp 10 02/14/20 1021  SpO2 100 % 02/14/20 1021  Vitals shown include unvalidated device data.  Last Pain:  Vitals:   02/14/20 0622  TempSrc:   PainSc: 0-No pain         Complications: No complications documented.

## 2020-02-14 NOTE — Progress Notes (Signed)
Pacemaker device interrogated [employer], [place]. Anesthesiologist and CRNA at bedside.

## 2020-02-15 ENCOUNTER — Encounter (HOSPITAL_COMMUNITY): Payer: Self-pay | Admitting: Neurosurgery

## 2020-02-15 LAB — BASIC METABOLIC PANEL
Anion gap: 8 (ref 5–15)
BUN: 17 mg/dL (ref 8–23)
CO2: 21 mmol/L — ABNORMAL LOW (ref 22–32)
Calcium: 8.7 mg/dL — ABNORMAL LOW (ref 8.9–10.3)
Chloride: 111 mmol/L (ref 98–111)
Creatinine, Ser: 1.16 mg/dL (ref 0.61–1.24)
GFR calc Af Amer: >60 mL/min (ref >60)
GFR calc non Af Amer: >60 mL/min (ref >60)
Glucose, Bld: 141 mg/dL — ABNORMAL HIGH (ref 70–99)
Potassium: 5 mmol/L (ref 3.5–5.1)
Sodium: 140 mmol/L (ref 135–145)

## 2020-02-15 LAB — CORTISOL-AM, BLOOD: Cortisol - AM: 6.7 ug/dL (ref 6.7–22.6)

## 2020-02-15 MED ORDER — CEPHALEXIN 500 MG PO CAPS: 500.0000 mg | ORAL_CAPSULE | Freq: Three times a day (TID) | ORAL | 0 refills | Status: AC

## 2020-02-15 MED ORDER — HYDROCODONE-ACETAMINOPHEN 5-325 MG PO TABS: 1.0000 | ORAL_TABLET | ORAL | Status: DC | PRN

## 2020-02-15 NOTE — Progress Notes (Signed)
   Providing Compassionate, Quality Care - Together   Subjective: Patient reports no issues overnight. He feels a little "groggy," but denies headache. He denies any nasal drainage. He denies any visual changes.  Objective: Vital signs in last 24 hours: Temp:  [96.5 F (35.8 C)-98 F (36.7 C)] 98 F (36.7 C) (09/04 0800) Pulse Rate:  [59-66] 60 (09/04 0600) Resp:  [7-21] 12 (09/04 0600) BP: (101-157)/(59-89) 106/61 (09/04 0600) SpO2:  [92 %-100 %] 95 % (09/04 0600) Arterial Line BP: (97-166)/(60-101) 101/60 (09/04 0600)  Intake/Output from previous day: 09/03 0701 - 09/04 0700 In: 2726.1 [P.O.:480; I.V.:2087.8; IV Piggyback:158.3] Out: 2700 [Urine:2625; Blood:75] Intake/Output this shift: No intake/output data recorded.  Alert and oriented x 4 PERRLA Speech clear, fluent CN II-XII grossly intact MAE, Strength and sensation intact Nose without clear drainage, small amount of dried sanguinous drainage at nares    Lab Results: No results for input(s): WBC, HGB, HCT, PLT in the last 72 hours. BMET Recent Labs    02/14/20 0559  NA 138  K 4.3  CL 107  CO2 19*  GLUCOSE 100*  BUN 26*  CREATININE 1.23  CALCIUM 9.0    Studies/Results: No results found.  Assessment/Plan: Patient is one day status post transphenoidal pituitary resection. He is doing well-postoperatively.   LOS: 1 day    -Follow up BMET ordered -will transfer out of ICU to Progressive unit.   Viona Gilmore, DNP, AGNP-C Nurse Practitioner  [facility] [place] [address], [address], [place], [place] [address] P: [phone]    F: [phone]  02/15/2020, 8:53 AM

## 2020-02-15 NOTE — Discharge Instructions (Signed)
Sinus/Nasal Instructions:  1. Limited activity 2. Liquid and soft diet, advance as tolerated 3. May bathe and shower 4. Saline nasal spray - 4 puffs/nostril every hour while awake, begin the morning after surgery-use until follow-up ENT appointment 5. Elevate Head of Bed 6. No nose blowing/Open mouth sneeze 7. Alternate Tylenol and ibuprofen every 6 hours as needed for pain.  Call [facility] for any nasal related questions or concerns: [phone] follow-up in approximately 2 weeks with Dr. Wilburn Cornelia for postoperative recheck.

## 2020-02-15 NOTE — Progress Notes (Signed)
   ENT Progress Note: POD #1 s/p Procedure(s): Transphenoidal resection of pituitary tumor TRANSPHENOIDAL APPROACH EXPOSURE   Subjective: No headache, minimal discharge  Objective: Vital signs in last 24 hours: Temp:  [96.5 F (35.8 C)-98 F (36.7 C)] 98 F (36.7 C) (09/04 0800) Pulse Rate:  [59-66] 60 (09/04 0600) Resp:  [7-21] 12 (09/04 0600) BP: (101-157)/(59-89) 106/61 (09/04 0600) SpO2:  [92 %-100 %] 95 % (09/04 0600) Arterial Line BP: (97-166)/(60-101) 101/60 (09/04 0600) Weight change:  Last BM Date:  (pta)  Intake/Output from previous day: 09/03 0701 - 09/04 0700 In: 2726.1 [P.O.:480; I.V.:2087.8; IV Piggyback:158.3] Out: 2700 [Urine:2625; Blood:75] Intake/Output this shift: No intake/output data recorded.  Labs: No results for input(s): WBC, HGB, HCT, PLT in the last 72 hours. Recent Labs    02/14/20 0559  NA 138  K 4.3  CL 107  CO2 19*  GLUCOSE 100*  BUN 26*  CALCIUM 9.0    Studies/Results: No results found.   PHYSICAL EXAM: Extra ocular mobility intact, normal vision.   Minimal bloody crusting in the nasal passageway.  No active bleeding or discharge   Assessment/Plan: Patient stable after endoscopic transsphenoidal pituitary resection.  Plan transfer to stepdown and presumed discharge tomorrow if continued improvement.  Patient will begin saline nasal spray this morning and use ongoing until ENT follow-up at 2 weeks.    Jerrell Belfast 02/15/2020, 9:16 AM

## 2020-02-15 NOTE — Progress Notes (Signed)
Report given to [facility] on 3W. Patient transferred to unit without incident. All belongings taken. Wife at bedside.  VSS

## 2020-02-15 NOTE — Progress Notes (Signed)
Foley cath removed at this time without complications. Will monitor for UO.

## 2020-02-16 MED ORDER — LEVETIRACETAM 500 MG PO TABS
500.0000 mg | ORAL_TABLET | Freq: Two times a day (BID) | ORAL | Status: DC
  Administered 2020-02-16 – 2020-02-17 (×2): 500 mg via ORAL
  Filled 2020-02-16 (×2): qty 1

## 2020-02-16 NOTE — Progress Notes (Signed)
   Providing Compassionate, Quality Care - Together  NEUROSURGERY PROGRESS NOTE   S: No issues overnight. No drainage from nose, no other complaints   O: EXAM:  BP (!) 146/89 (BP Location: Right Arm)   Pulse 60   Temp 97.9 F (36.6 C) (Oral)   Resp 18   Ht 5\' 7"  (1.702 m)   Wt 88.5 kg   SpO2 99%   BMI 30.54 kg/m   Awake, alert, oriented  Speech fluent, appropriate  CNs grossly intact  5/5 BUE/BLE  No drainage from nose Visual field full on gross assessment  ASSESSMENT:  74 y.o. male with  S/p TSP POD#2  PLAN: - doing well, will monitor today and f/u labs in am - pain control - mon lytes - likely dc tomorrow home    Thank you for allowing me to participate in this patient's care.  Please do not hesitate to call with questions or concerns.   Elwin Sleight, [facility] Cell: [phone]

## 2020-02-17 LAB — BASIC METABOLIC PANEL
Anion gap: 10 (ref 5–15)
BUN: 16 mg/dL (ref 8–23)
CO2: 22 mmol/L (ref 22–32)
Calcium: 8.4 mg/dL — ABNORMAL LOW (ref 8.9–10.3)
Chloride: 107 mmol/L (ref 98–111)
Creatinine, Ser: 0.92 mg/dL (ref 0.61–1.24)
GFR calc Af Amer: >60 mL/min (ref >60)
GFR calc non Af Amer: >60 mL/min (ref >60)
Glucose, Bld: 110 mg/dL — ABNORMAL HIGH (ref 70–99)
Potassium: 4.3 mmol/L (ref 3.5–5.1)
Sodium: 139 mmol/L (ref 135–145)

## 2020-02-17 MED ORDER — DOCUSATE SODIUM 100 MG PO CAPS: 100.0000 mg | ORAL_CAPSULE | Freq: Two times a day (BID) | ORAL | 0 refills | Status: DC

## 2020-02-17 MED ORDER — SALINE SPRAY 0.65 % NA SOLN
1.0000 | NASAL | 0 refills | Status: DC
Start: 2020-02-17 — End: 2021-02-18

## 2020-02-17 MED ORDER — CEPHALEXIN 500 MG PO CAPS: 500.0000 mg | ORAL_CAPSULE | Freq: Three times a day (TID) | ORAL | 0 refills | Status: DC

## 2020-02-17 MED ORDER — SALINE SPRAY 0.65 % NA SOLN
1.0000 | NASAL | Status: DC
  Administered 2020-02-17: 1 via NASAL
  Filled 2020-02-17: qty 44

## 2020-02-17 MED ORDER — ACETAMINOPHEN-CODEINE #3 300-30 MG PO TABS
1.0000 | ORAL_TABLET | ORAL | 0 refills | Status: DC | PRN
Start: 2020-02-17 — End: 2021-02-18

## 2020-02-17 MED ORDER — DOCUSATE SODIUM 100 MG PO CAPS
100.0000 mg | ORAL_CAPSULE | Freq: Two times a day (BID) | ORAL | Status: DC
  Administered 2020-02-17: 100 mg via ORAL
  Filled 2020-02-17: qty 1

## 2020-02-17 NOTE — Progress Notes (Signed)
Patient ready for discharge to home; discharge instructions given and reviewed; wife present at bedside; Rx's sent electronically.  Patient discharged out via wheelchair; all belongings returned.

## 2020-02-17 NOTE — Discharge Summary (Signed)
Physician Discharge Summary  Patient ID: Daniel Reyes MRN: [id number] DOB/AGE: 11/24/1945 74 y.o.  Admit date: 02/14/2020 Discharge date: 02/17/2020  Admission Diagnoses:  Pituitary mass  Discharge Diagnoses:  Same Active Problems:   Pituitary tumor   Status post transsphenoidal pituitary resection [facility])   Discharged Condition: Stable  Hospital Course:  Daniel Reyes is a 74 y.o. male who underwent the below surgery and tolerated it well.  There were no complications.  He was monitored in the ICU postoperatively for close neurologic monitoring.  He was at his neurologic baseline upon discharge, ambulating well.  He had no signs or symptoms of CSF leaking.  He was tolerating a normal diet.  He was having normal bowel and bladder function.  His pain was controlled on oral medication upon discharge.  Treatments: Surgery - 1. Endoscopic transsphenoidal pituitary resection with intraoperative navigation                                     2.  Bilateral endoscopic sphenoidotomy  Discharge Exam: Blood pressure 112/75, pulse (!) 59, temperature 97.8 F (36.6 C), temperature source Oral, resp. rate 16, height 5\' 7"  (1.702 m), weight 88.5 kg, SpO2 96 %. Awake, alert, oriented Speech fluent, appropriate CNs grossly intact 5/5 BUE/BLE No nasal drainage Visual field grossly full  Disposition: Discharge disposition: 01-Home or Self Care        Allergies as of 02/17/2020      Reactions   Imdur [isosorbide Dinitrate] Anaphylaxis, Nausea And Vomiting   Nose swelling with difficulty breathing thru nose      Medication List    STOP taking these medications   amoxicillin 875 MG tablet Commonly known as: AMOXIL   doxycycline 100 MG capsule Commonly known as: VIBRAMYCIN     TAKE these medications   acetaminophen-codeine 300-30 MG tablet Commonly known as: TYLENOL #3 Take 1 tablet by mouth every 4 (four) hours as needed for moderate pain.   albuterol 108 (90 Base) MCG/ACT  inhaler Commonly known as: VENTOLIN HFA Inhale 2 puffs into the lungs every 6 (six) hours as needed for wheezing or shortness of breath.   atorvastatin 20 MG tablet Commonly known as: LIPITOR TAKE 1 TABLET BY MOUTH EVERY DAY   Breo Ellipta 100-25 MCG/INH Aepb Generic drug: fluticasone furoate-vilanterol INHALE 1 PUFF BY MOUTH EVERY DAY   cephALEXin 500 MG capsule Commonly known as: Keflex Take 1 capsule (500 mg total) by mouth 3 (three) times daily for 10 days.   cephALEXin 500 MG capsule Commonly known as: KEFLEX Take 1 capsule (500 mg total) by mouth 3 (three) times daily.   cholecalciferol 25 MCG (1000 UNIT) tablet Commonly known as: VITAMIN D3 Take 1,000 Units by mouth daily.   docusate sodium 100 MG capsule Commonly known as: COLACE Take 1 capsule (100 mg total) by mouth 2 (two) times daily.   multivitamin with minerals Tabs tablet Take 1 tablet by mouth daily.   sodium chloride 0.65 % Soln nasal spray Commonly known as: OCEAN Place 1 spray into both nostrils every 2 (two) hours.       Follow-up Information    Jerrell Belfast, MD In 2 weeks.   Specialty: Otolaryngology Contact information: [address] [place] [address] [phone]        Ashok Pall, MD Follow up in 2 week(s).   Specialty: Neurosurgery Why: call for appointment Contact information: [address] [place] [address] [phone]               Signed: Theodoro Doing Bruno Leach 02/17/2020, 10:29 AM

## 2020-02-18 LAB — SURGICAL PATHOLOGY

## 2020-02-20 NOTE — Op Note (Signed)
02/14/2020  3:10 PM  PATIENT:  Daniel Reyes  74 y.o. male  PRE-OPERATIVE DIAGNOSIS:  Pituitary tumor  POST-OPERATIVE DIAGNOSIS:  Pituitary Tumor  PROCEDURE:  Procedure(s): Transphenoidal resection of pituitary tumor TRANSPHENOIDAL APPROACH EXPOSURE  SURGEON: Surgeon(s): Ashok Pall, MD Jerrell Belfast, MD  ASSISTANTS:none  ANESTHESIA:   general  EBL:  No intake/output data recorded.  BLOOD ADMINISTERED:none  CELL SAVER GIVEN:none  COUNT:per nursing  DRAINS: none   SPECIMEN:  Source of Specimen:  pituitary gland  DICTATION: ILYAAS MUSTO was taken to the operating room, intubated, and placed under a general anesthetic without difficulty. Under separate cover Dr. Wilburn Cornelia will dictate the approach and exposure of the sphenoid sinus, along with the closure.  Once the sphenoid was entered using endoscopic assistance from dr. Wilburn Cornelia I used a small chisel to create an opening in the sella. I used Kerrison punches to remove more bone, and with navigation I could see where my exposure of the gland encompassed. With adequate exposure I then used coagulation to cauterize the dura, then opened the dura with a scalped. I used a variety of currettes to then remove the tumor. I first started in the inferior portion of the gland, then the sides and eventually the superior aspect. I eventually believed that all of the tumor available was removed. I felt that I could appreciate the chiasm, and the diaphragma was pulsatile. I then finished, and Dr. Wilburn Cornelia closed.  PLAN OF CARE: Admit to inpatient   PATIENT DISPOSITION:  PACU - hemodynamically stable.   Delay start of Pharmacological VTE agent (>24hrs) due to surgical blood loss or risk of bleeding:  no

## 2020-02-24 ENCOUNTER — Ambulatory Visit (INDEPENDENT_AMBULATORY_CARE_PROVIDER_SITE_OTHER): Payer: Medicare HMO | Admitting: *Deleted

## 2020-02-24 DIAGNOSIS — I495 Sick sinus syndrome: Secondary | ICD-10-CM

## 2020-02-26 LAB — CUP PACEART REMOTE DEVICE CHECK
Battery Remaining Longevity: 132 mo
Battery Remaining Percentage: 100 %
Brady Statistic RA Percent Paced: 76 %
Brady Statistic RV Percent Paced: 4 %
Date Time Interrogation Session: 20210913025100
Implantable Lead Implant Date: 20191203
Implantable Lead Implant Date: 20191203
Implantable Lead Location: 753859
Implantable Lead Location: 753860
Implantable Lead Model: 7740
Implantable Lead Model: 7741
Implantable Lead Serial Number: 1095486
Implantable Lead Serial Number: 711800
Implantable Pulse Generator Implant Date: 20191203
Lead Channel Impedance Value: 611 Ohm
Lead Channel Impedance Value: 670 Ohm
Lead Channel Pacing Threshold Amplitude: 0.4 V
Lead Channel Pacing Threshold Amplitude: 1.1 V
Lead Channel Pacing Threshold Pulse Width: 0.4 ms
Lead Channel Pacing Threshold Pulse Width: 0.4 ms
Lead Channel Setting Pacing Amplitude: 3.5 V
Lead Channel Setting Pacing Amplitude: 3.5 V
Lead Channel Setting Pacing Pulse Width: 0.4 ms
Lead Channel Setting Sensing Sensitivity: 2.5 mV
Pulse Gen Serial Number: 838922

## 2020-02-26 NOTE — Progress Notes (Signed)
Remote pacemaker transmission.   

## 2020-03-12 ENCOUNTER — Encounter: Payer: Self-pay | Admitting: Family Medicine

## 2020-03-12 ENCOUNTER — Ambulatory Visit (INDEPENDENT_AMBULATORY_CARE_PROVIDER_SITE_OTHER): Payer: Medicare HMO | Admitting: Family Medicine

## 2020-03-12 ENCOUNTER — Other Ambulatory Visit: Payer: Self-pay

## 2020-03-12 VITALS — BP 104/80 | HR 54 | Temp 98.1°F | Resp 18 | Ht 67.0 in | Wt 197.6 lb

## 2020-03-12 DIAGNOSIS — R351 Nocturia: Secondary | ICD-10-CM

## 2020-03-12 DIAGNOSIS — R739 Hyperglycemia, unspecified: Secondary | ICD-10-CM

## 2020-03-12 DIAGNOSIS — E785 Hyperlipidemia, unspecified: Secondary | ICD-10-CM

## 2020-03-12 DIAGNOSIS — Z87898 Personal history of other specified conditions: Secondary | ICD-10-CM | POA: Diagnosis not present

## 2020-03-12 DIAGNOSIS — D497 Neoplasm of unspecified behavior of endocrine glands and other parts of nervous system: Secondary | ICD-10-CM

## 2020-03-12 DIAGNOSIS — Z0000 Encounter for general adult medical examination without abnormal findings: Secondary | ICD-10-CM | POA: Diagnosis not present

## 2020-03-12 DIAGNOSIS — Z1159 Encounter for screening for other viral diseases: Secondary | ICD-10-CM

## 2020-03-12 NOTE — Assessment & Plan Note (Signed)
Encouraged heart healthy diet, increase exercise, avoid trans fats, consider a krill oil cap daily 

## 2020-03-12 NOTE — Progress Notes (Signed)
Patient ID: Daniel Reyes, male    DOB: [birth year]  Age: 74 y.o. MRN: [id number]    Subjective:  Subjective  HPI [name] presents for cpe and multiple problems --- he recently had a pituitary tumor removed with Dr Christella Noa and shoemaker .   He is still struggling with low bp / fatigue and they recommended he see an endocrine   He also c/o tremors that are very mild and does not really bother him much but he promised his wife he would ask.    Review of Systems  Constitutional: Positive for fatigue. Negative for appetite change, diaphoresis and unexpected weight change.  HENT: Negative for congestion, ear pain, hearing loss, nosebleeds, postnasal drip, rhinorrhea, sinus pressure, sneezing and tinnitus.   Eyes: Negative for photophobia, pain, discharge, redness, itching and visual disturbance.  Respiratory: Negative.  Negative for cough, chest tightness, shortness of breath and wheezing.   Cardiovascular: Negative.  Negative for chest pain, palpitations and leg swelling.  Gastrointestinal: Negative for abdominal distention, abdominal pain, anal bleeding, blood in stool and constipation.  Endocrine: Negative.  Negative for cold intolerance, heat intolerance, polydipsia, polyphagia and polyuria.  Genitourinary: Negative.  Negative for difficulty urinating, dysuria and frequency.  Musculoskeletal: Negative.   Skin: Negative.   Allergic/Immunologic: Negative.   Neurological: Positive for tremors. Negative for dizziness, weakness, light-headedness, numbness and headaches.  Psychiatric/Behavioral: Negative for agitation, confusion, decreased concentration, dysphoric mood, sleep disturbance and suicidal ideas. The patient is not nervous/anxious.     History Past Medical History:  Diagnosis Date  . Dyspnea   . GERD (gastroesophageal reflux disease)   . History of chicken pox   . Hyperlipemia   . Mild CAD    a. cath 02/09/17 - Mild to moderate proximal and mid LAD plaque with less than 40%  narrowing  . Nocturnal hypoxia   . Presence of permanent cardiac pacemaker 2019   approximately 2 years ago    He has a past surgical history that includes Cholecystectomy open (1998); Neck surgery (1983); gerd surgery; RIGHT/LEFT HEART CATH AND CORONARY ANGIOGRAPHY (N/A, 02/09/2017); Tonsillectomy and adenoidectomy; Back surgery (1980); PACEMAKER IMPLANT (N/A, 05/15/2018); Tonsillectomy; Insert / replace / remove pacemaker; Cardiac catheterization (02/09/2017); Craniotomy (N/A, 02/14/2020); and Transphenoidal approach exposure (N/A, 02/14/2020).   His family history includes Alzheimer's disease in his mother and paternal grandmother; Cancer in his father; Colon cancer in his father; Heart attack in his father; Heart disease in his brother; Mental illness in his maternal grandfather; Peripheral vascular disease in his paternal grandfather; Stroke in his brother and maternal grandmother.He reports that he has never smoked. He has never used smokeless tobacco. He reports current alcohol use. He reports that he does not use drugs.  Current Outpatient Medications on File Prior to Visit  Medication Sig Dispense Refill  . acetaminophen-codeine (TYLENOL #3) 300-30 MG tablet Take 1 tablet by mouth every 4 (four) hours as needed for moderate pain. 30 tablet 0  . albuterol (PROVENTIL HFA;VENTOLIN HFA) 108 (90 Base) MCG/ACT inhaler Inhale 2 puffs into the lungs every 6 (six) hours as needed for wheezing or shortness of breath. 1 Inhaler 2  . atorvastatin (LIPITOR) 20 MG tablet TAKE 1 TABLET BY MOUTH EVERY DAY (Patient taking differently: Take 20 mg by mouth daily. ) 90 tablet 0  . BREO ELLIPTA 100-25 MCG/INH AEPB INHALE 1 PUFF BY MOUTH EVERY DAY 180 each 3  . cholecalciferol (VITAMIN D3) 25 MCG (1000 UNIT) tablet Take 1,000 Units by mouth daily.    Marland Kitchen  docusate sodium (COLACE) 100 MG capsule Take 1 capsule (100 mg total) by mouth 2 (two) times daily. 20 capsule 0  . Multiple Vitamin (MULTIVITAMIN WITH MINERALS) TABS  tablet Take 1 tablet by mouth daily.    . sodium chloride (OCEAN) 0.65 % SOLN nasal spray Place 1 spray into both nostrils every 2 (two) hours. 88 mL 0   No current facility-administered medications on file prior to visit.      Health Maintenance  Topic Date Due  . Hepatitis C Screening  Never done  . COVID-19 Vaccine (1) Never done  . INFLUENZA VACCINE  01/12/2020  . COLONOSCOPY  01/11/2025  . TETANUS/TDAP  05/30/2027  . PNA vac Low Risk Adult  Completed    Objective:  Objective  Physical Exam Vitals and nursing note reviewed.  Constitutional:      General: He is sleeping. He is not in acute distress.    Appearance: He is well-developed. He is not diaphoretic.  HENT:     Head: Normocephalic and atraumatic.     Right Ear: External ear normal.     Left Ear: External ear normal.     Nose: Nose normal.     Mouth/Throat:     Pharynx: No oropharyngeal exudate.  Eyes:     General:        Right eye: No discharge.        Left eye: No discharge.     Conjunctiva/sclera: Conjunctivae normal.     Pupils: Pupils are equal, round, and reactive to light.  Neck:     Thyroid: No thyromegaly.     Vascular: No JVD.  Cardiovascular:     Rate and Rhythm: Normal rate and regular rhythm.     Heart sounds: No murmur heard.  No friction rub. No gallop.   Pulmonary:     Effort: Pulmonary effort is normal. No respiratory distress.     Breath sounds: Normal breath sounds. No wheezing or rales.  Chest:     Chest wall: No tenderness.  Abdominal:     General: Bowel sounds are normal. There is no distension.     Palpations: Abdomen is soft. There is no mass.     Tenderness: There is no abdominal tenderness. There is no guarding or rebound.  Genitourinary:    Penis: Normal.      Prostate: Normal.     Rectum: Normal. Guaiac result negative.  Musculoskeletal:        General: No tenderness. Normal range of motion.     Cervical back: Normal range of motion and neck supple.  Lymphadenopathy:      Cervical: No cervical adenopathy.  Skin:    General: Skin is warm and dry.     Coloration: Skin is not pale.     Findings: No erythema or rash.  Neurological:     Mental Status: He is oriented to person, place, and time.     Motor: No abnormal muscle tone.     Deep Tendon Reflexes: Reflexes are normal and symmetric. Reflexes normal.     Comments: Min tremor in both hand ---not interfering with pt activities of daily living   Psychiatric:        Behavior: Behavior normal.        Thought Content: Thought content normal.        Judgment: Judgment normal.    BP 104/80 (BP Location: Left Arm, Patient Position: Sitting, Cuff Size: Normal)   Pulse (!) 54   Temp 98.1 F (36.7 C) (  Oral)   Resp 18   Ht 5\' 7"  (1.702 m)   Wt 197 lb 9.6 oz (89.6 kg)   SpO2 100%   BMI 30.95 kg/m  Wt Readings from Last 3 Encounters:  03/12/20 197 lb 9.6 oz (89.6 kg)  02/14/20 195 lb (88.5 kg)  02/12/20 194 lb 11.2 oz (88.3 kg)     Lab Results  Component Value Date   WBC 5.8 02/12/2020   HGB 13.6 02/12/2020   HCT 41.6 02/12/2020   PLT 238 02/12/2020   GLUCOSE 110 (H) 02/17/2020   CHOL 137 12/06/2018   TRIG 81.0 12/06/2018   HDL 44.00 12/06/2018   LDLCALC 77 12/06/2018   ALT 23 12/06/2018   AST 20 12/06/2018   NA 139 02/17/2020   K 4.3 02/17/2020   CL 107 02/17/2020   CREATININE 0.92 02/17/2020   BUN 16 02/17/2020   CO2 22 02/17/2020   TSH 0.932 10/21/2019   PSA 0.59 12/06/2018   INR 1.05 02/08/2017   HGBA1C 5.8 12/06/2018    No results found.   Assessment & Plan:  Plan  I have discontinued Rossi Burdo. Nell's cephALEXin. I am also having him maintain his albuterol, multivitamin with minerals, atorvastatin, cholecalciferol, Breo Ellipta, docusate sodium, sodium chloride, and acetaminophen-codeine.  No orders of the defined types were placed in this encounter.   Problem List Items Addressed This Visit      Unprioritized   Hyperglycemia   Relevant Orders   Comprehensive metabolic  panel   Hemoglobin A1c   Hyperlipidemia    Encouraged heart healthy diet, increase exercise, avoid trans fats, consider a krill oil cap daily      Relevant Orders   Lipid panel   Nocturia   Relevant Orders   PSA   Pituitary tumor    Per neurosurgery / ent Refer to endo       Preventative health care - Primary    ghm utd Check labs  See AVS F/u pcp        Other Visit Diagnoses    H/O: pituitary tumor       Relevant Orders   Ambulatory referral to Endocrinology   Need for hepatitis C screening test       Relevant Orders   Hepatitis C antibody      Follow-up: Return in about 6 months (around 09/09/2020), or if symptoms worsen or fail to improve, for hyperlipidemia.  Ann Held, DO

## 2020-03-12 NOTE — Assessment & Plan Note (Signed)
ghm utd Check labs  See AVS F/u pcp

## 2020-03-12 NOTE — Assessment & Plan Note (Signed)
Per neurosurgery / ent Refer to endo

## 2020-03-12 NOTE — Progress Notes (Deleted)
New Patient Office Visit  Subjective:  Patient ID: Daniel Reyes, male    DOB: 1945-12-06  Age: 74 y.o. MRN: [id number]  CC:  Chief Complaint  Patient presents with  . Annual Exam    Pt states fasting    HPI Daniel Reyes presents for ***  Past Medical History:  Diagnosis Date  . Dyspnea   . GERD (gastroesophageal reflux disease)   . History of chicken pox   . Hyperlipemia   . Mild CAD    a. cath 02/09/17 - Mild to moderate proximal and mid LAD plaque with less than 40% narrowing  . Nocturnal hypoxia   . Presence of permanent cardiac pacemaker 2019   approximately 2 years ago    Past Surgical History:  Procedure Laterality Date  . BACK SURGERY  1980  . CARDIAC CATHETERIZATION  02/09/2017  . CHOLECYSTECTOMY OPEN  1998  . CRANIOTOMY N/A 02/14/2020   Procedure: Transphenoidal resection of pituitary tumor;  Surgeon: Ashok Pall, MD;  Location: [facility];  Service: Neurosurgery;  Laterality: N/A;  . gerd surgery    . INSERT / REPLACE / REMOVE PACEMAKER    . NECK SURGERY  1983  . PACEMAKER IMPLANT N/A 05/15/2018   Procedure: PACEMAKER IMPLANT - daul chamber;  Surgeon: Evans Lance, MD;  Location: [facility];  Service: Cardiovascular;  Laterality: N/A;  . RIGHT/LEFT HEART CATH AND CORONARY ANGIOGRAPHY N/A 02/09/2017   Procedure: RIGHT/LEFT HEART CATH AND CORONARY ANGIOGRAPHY;  Surgeon: Belva Crome, MD;  Location: [facility];  Service: Cardiovascular;  Laterality: N/A;  . TONSILLECTOMY    . TONSILLECTOMY AND ADENOIDECTOMY    . TRANSPHENOIDAL APPROACH EXPOSURE N/A 02/14/2020   Procedure: TRANSPHENOIDAL APPROACH EXPOSURE;  Surgeon: Jerrell Belfast, MD;  Location: [facility];  Service: ENT;  Laterality: N/A;    Family History  Problem Relation Age of Onset  . Heart attack Father   . Colon cancer Father   . Cancer Father   . Alzheimer's disease Mother   . Stroke Brother   . Heart disease Brother        afib  . Stroke Maternal Grandmother   . Mental illness  Maternal Grandfather        suicide  . Alzheimer's disease Paternal Grandmother   . Peripheral vascular disease Paternal Grandfather   . Alcohol abuse Neg Hx     Social History   Socioeconomic History  . Marital status: Married    Spouse name: Not on file  . Number of children: 3  . Years of education: Not on file  . Highest education level: Not on file  Occupational History  . 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 Smoking status: Never Smoker  . Smokeless tobacco: Never Used  Vaping Use  . Vaping Use: Never used  Substance and Sexual Activity  . Alcohol use: Yes    Comment: very rarely  . Drug use: No  . Sexual activity: Not Currently  Other Topics Concern  . Not on file  Social History Narrative   Lives with wife, retired Pharmacist, community, no major dietary restrictions, negative cigarettes. Rare alcohol, wears seat belt most of the time   Social Determinants of Health   Financial Resource Strain:   . Difficulty of Paying Living Expenses: Not on file  Food Insecurity:   . Worried About Charity fundraiser in the Last Year: Not on file  . Ran Out of Food in the Last  Year: Not on file  Transportation Needs:   . Lack of Transportation (Medical): Not on file  . Lack of Transportation (Non-Medical): Not on file  Physical Activity:   . Days of Exercise per Week: Not on file  . Minutes of Exercise per Session: Not on file  Stress:   . Feeling of Stress : Not on file  Social Connections:   . Frequency of Communication with Friends and Family: Not on file  . Frequency of Social Gatherings with Friends and Family: Not on file  . Attends Religious Services: Not on file  . Active Member of Clubs or Organizations: Not on file  . Attends Archivist Meetings: Not on file  . Marital Status: Not on file  Intimate Partner Violence:   . Fear of Current or Ex-Partner: Not on file  . Emotionally Abused: Not on file  . Physically  Abused: Not on file  . Sexually Abused: Not on file    ROS Review of Systems  Objective:   Today's Vitals: BP 104/80 (BP Location: Left Arm, Patient Position: Sitting, Cuff Size: Normal)   Pulse (!) 54   Temp 98.1 F (36.7 C) (Oral)   Resp 18   Ht 5\' 7"  (1.702 m)   Wt 197 lb 9.6 oz (89.6 kg)   SpO2 100%   BMI 30.95 kg/m   Physical Exam  Assessment & Plan:   Problem List Items Addressed This Visit      Unprioritized   Hyperglycemia   Relevant Orders   Comprehensive metabolic panel   Hemoglobin A1c   Hyperlipidemia   Relevant Orders   Lipid panel   Nocturia   Relevant Orders   PSA    Other Visit Diagnoses    Preventative health care    -  Primary   H/O: pituitary tumor       Relevant Orders   Ambulatory referral to Endocrinology   Need for hepatitis C screening test       Relevant Orders   Hepatitis C antibody      Outpatient Encounter Medications as of 03/12/2020  Medication Sig  . acetaminophen-codeine (TYLENOL #3) 300-30 MG tablet Take 1 tablet by mouth every 4 (four) hours as needed for moderate pain.  Marland Kitchen albuterol (PROVENTIL HFA;VENTOLIN HFA) 108 (90 Base) MCG/ACT inhaler Inhale 2 puffs into the lungs every 6 (six) hours as needed for wheezing or shortness of breath.  Marland Kitchen atorvastatin (LIPITOR) 20 MG tablet TAKE 1 TABLET BY MOUTH EVERY DAY (Patient taking differently: Take 20 mg by mouth daily. )  . BREO ELLIPTA 100-25 MCG/INH AEPB INHALE 1 PUFF BY MOUTH EVERY DAY  . cholecalciferol (VITAMIN D3) 25 MCG (1000 UNIT) tablet Take 1,000 Units by mouth daily.  Marland Kitchen docusate sodium (COLACE) 100 MG capsule Take 1 capsule (100 mg total) by mouth 2 (two) times daily.  . Multiple Vitamin (MULTIVITAMIN WITH MINERALS) TABS tablet Take 1 tablet by mouth daily.  . sodium chloride (OCEAN) 0.65 % SOLN nasal spray Place 1 spray into both nostrils every 2 (two) hours.  . [DISCONTINUED] cephALEXin (KEFLEX) 500 MG capsule Take 1 capsule (500 mg total) by mouth 3 (three) times daily.  (Patient not taking: Reported on 03/12/2020)   No facility-administered encounter medications on file as of 03/12/2020.    Follow-up: Return in about 6 months (around 09/09/2020), or if symptoms worsen or fail to improve, for hyperlipidemia.   Ann Held, DO

## 2020-03-12 NOTE — Patient Instructions (Signed)

## 2020-03-13 LAB — HEMOGLOBIN A1C
Hgb A1c MFr Bld: 5.8 % of total Hgb — ABNORMAL HIGH (ref <5.7)
Mean Plasma Glucose: 120 (calc)
eAG (mmol/L): 6.6 (calc)

## 2020-03-13 LAB — COMPREHENSIVE METABOLIC PANEL
AG Ratio: 1.6 (calc) (ref 1.0–2.5)
ALT: 42 U/L (ref 9–46)
AST: 18 U/L (ref 10–35)
Albumin: 4.3 g/dL (ref 3.6–5.1)
Alkaline phosphatase (APISO): 65 U/L (ref 35–144)
BUN: 22 mg/dL (ref 7–25)
CO2: 23 mmol/L (ref 20–32)
Calcium: 9.5 mg/dL (ref 8.6–10.3)
Chloride: 109 mmol/L (ref 98–110)
Creat: 1.03 mg/dL (ref 0.70–1.18)
Globulin: 2.7 g/dL (calc) (ref 1.9–3.7)
Glucose, Bld: 91 mg/dL (ref 65–99)
Potassium: 4.8 mmol/L (ref 3.5–5.3)
Sodium: 144 mmol/L (ref 135–146)
Total Bilirubin: 0.6 mg/dL (ref 0.2–1.2)
Total Protein: 7 g/dL (ref 6.1–8.1)

## 2020-03-13 LAB — LIPID PANEL
Cholesterol: 169 mg/dL (ref <200)
HDL: 59 mg/dL (ref >=40)
LDL Cholesterol (Calc): 91 mg/dL (calc)
Non-HDL Cholesterol (Calc): 110 mg/dL (calc) (ref <130)
Total CHOL/HDL Ratio: 2.9 (calc) (ref <5.0)
Triglycerides: 91 mg/dL (ref <150)

## 2020-03-13 LAB — HEPATITIS C ANTIBODY
Hepatitis C Ab: NONREACTIVE
SIGNAL TO CUT-OFF: 0.05 (ref <1.00)

## 2020-03-13 LAB — PSA: PSA: 0.53 ng/mL (ref <=4.0)

## 2020-03-24 ENCOUNTER — Ambulatory Visit (INDEPENDENT_AMBULATORY_CARE_PROVIDER_SITE_OTHER): Payer: Medicare HMO | Admitting: Internal Medicine

## 2020-03-24 ENCOUNTER — Ambulatory Visit: Payer: Medicare HMO | Attending: Internal Medicine

## 2020-03-24 ENCOUNTER — Encounter: Payer: Self-pay | Admitting: Internal Medicine

## 2020-03-24 ENCOUNTER — Other Ambulatory Visit: Payer: Self-pay

## 2020-03-24 ENCOUNTER — Other Ambulatory Visit (HOSPITAL_BASED_OUTPATIENT_CLINIC_OR_DEPARTMENT_OTHER): Payer: Self-pay | Admitting: Internal Medicine

## 2020-03-24 VITALS — BP 110/80 | HR 60 | Ht 67.0 in | Wt 199.0 lb

## 2020-03-24 DIAGNOSIS — Z9889 Other specified postprocedural states: Secondary | ICD-10-CM | POA: Diagnosis not present

## 2020-03-24 DIAGNOSIS — Z86018 Personal history of other benign neoplasm: Secondary | ICD-10-CM | POA: Diagnosis not present

## 2020-03-24 DIAGNOSIS — Z23 Encounter for immunization: Secondary | ICD-10-CM

## 2020-03-24 DIAGNOSIS — D497 Neoplasm of unspecified behavior of endocrine glands and other parts of nervous system: Secondary | ICD-10-CM | POA: Diagnosis not present

## 2020-03-24 MED FILL — PFIZER-BIONTECH COVID-19 VA: 30 | 1 days supply | Qty: 0 | Fill #0

## 2020-03-24 NOTE — Progress Notes (Signed)
Name: Daniel Reyes  MRN/ DOB: [id number], 1946/05/20    Age/ Sex: 74 y.o., male    PCP: Mosie Lukes, MD   Reason for Endocrinology Evaluation: S/P transphenoidal pituitary resection       Date of Initial Endocrinology Evaluation: 03/24/2020     HPI: Daniel Reyes is a 74 y.o. male with a past medical history of dyslipidemia . The patient presented for initial endocrinology clinic visit on 03/24/2020 for consultative assistance with his Hx of pituitary macroadenoma    Pt has been having  fatigued and dizziness for the past few years, he was evaluated  By cardiology, cardiac  cath was negative  Was evaluated by cardiology and found to have RBBB but cardiac cath was normal  Pt was evaluated by neurology and brain imaging revealed pituitary macroadenoma  24 x 17 x 17 mm.      He is S/P transphenoidal pituitary surgery 02/14/2020 through Dr. Ashok Pall.    Has minimal headaches. Has noted improvement in his visual field.  Nocturia x2 which is chronic  Denies polydipsia   Fatigue has been improving  His BP was low the first few weeks after the surgery  Denies diarrhea  Has gained 5 lbs since the surgery   He denies change in shoe size Denies excessive sweating     Denies FH of endocrine disease   HISTORY:  Past Medical History:  Past Medical History:  Diagnosis Date  . Dyspnea   . GERD (gastroesophageal reflux disease)   . History of chicken pox   . Hyperlipemia   . Mild CAD    a. cath 02/09/17 - Mild to moderate proximal and mid LAD plaque with less than 40% narrowing  . Nocturnal hypoxia   . Presence of permanent cardiac pacemaker 2019   approximately 2 years ago   Past Surgical History:  Past Surgical History:  Procedure Laterality Date  . BACK SURGERY  1980  . CARDIAC CATHETERIZATION  02/09/2017  . CHOLECYSTECTOMY OPEN  1998  . CRANIOTOMY N/A 02/14/2020   Procedure: Transphenoidal resection of pituitary tumor;  Surgeon: Ashok Pall, MD;  Location:  [facility];  Service: Neurosurgery;  Laterality: N/A;  . gerd surgery    . INSERT / REPLACE / REMOVE PACEMAKER    . NECK SURGERY  1983  . PACEMAKER IMPLANT N/A 05/15/2018   Procedure: PACEMAKER IMPLANT - daul chamber;  Surgeon: Evans Lance, MD;  Location: [facility];  Service: Cardiovascular;  Laterality: N/A;  . RIGHT/LEFT HEART CATH AND CORONARY ANGIOGRAPHY N/A 02/09/2017   Procedure: RIGHT/LEFT HEART CATH AND CORONARY ANGIOGRAPHY;  Surgeon: Belva Crome, MD;  Location: [facility];  Service: Cardiovascular;  Laterality: N/A;  . TONSILLECTOMY    . TONSILLECTOMY AND ADENOIDECTOMY    . TRANSPHENOIDAL APPROACH EXPOSURE N/A 02/14/2020   Procedure: TRANSPHENOIDAL APPROACH EXPOSURE;  Surgeon: Jerrell Belfast, MD;  Location: [facility];  Service: ENT;  Laterality: N/A;      Social History:  reports that he has never smoked. He has never used smokeless tobacco. He reports current alcohol use. He reports that he does not use drugs.  Family History: family history includes Alzheimer's disease in his mother and paternal grandmother; Cancer in his father; Colon cancer in his father; Heart attack in his father; Heart disease in his brother; Mental illness in his maternal grandfather; Peripheral vascular disease in his paternal grandfather; Stroke in his brother and maternal grandmother.   HOME MEDICATIONS: Allergies as  of 03/24/2020      Reactions   Imdur [isosorbide Dinitrate] Anaphylaxis, Nausea And Vomiting   Nose swelling with difficulty breathing thru nose      Medication List       Accurate as of March 24, 2020  8:45 AM. If you have any questions, ask your nurse or doctor.        acetaminophen-codeine 300-30 MG tablet Commonly known as: TYLENOL #3 Take 1 tablet by mouth every 4 (four) hours as needed for moderate pain.   albuterol 108 (90 Base) MCG/ACT inhaler Commonly known as: VENTOLIN HFA Inhale 2 puffs into the lungs every 6 (six) hours as needed for wheezing or  shortness of breath.   atorvastatin 20 MG tablet Commonly known as: LIPITOR TAKE 1 TABLET BY MOUTH EVERY DAY   Breo Ellipta 100-25 MCG/INH Aepb Generic drug: fluticasone furoate-vilanterol INHALE 1 PUFF BY MOUTH EVERY DAY   cholecalciferol 25 MCG (1000 UNIT) tablet Commonly known as: VITAMIN D3 Take 1,000 Units by mouth daily.   docusate sodium 100 MG capsule Commonly known as: COLACE Take 1 capsule (100 mg total) by mouth 2 (two) times daily.   multivitamin with minerals Tabs tablet Take 1 tablet by mouth daily.   sodium chloride 0.65 % Soln nasal spray Commonly known as: OCEAN Place 1 spray into both nostrils every 2 (two) hours.         REVIEW OF SYSTEMS: A comprehensive ROS was conducted with the patient and is negative except as per HPI      OBJECTIVE:  VS: BP 110/80   Pulse 60   Ht 5\' 7"  (1.702 m)   Wt 199 lb (90.3 kg)   SpO2 97%   BMI 31.17 kg/m    Wt Readings from Last 3 Encounters:  03/24/20 199 lb (90.3 kg)  03/12/20 197 lb 9.6 oz (89.6 kg)  02/14/20 195 lb (88.5 kg)     EXAM: General: Pt appears well and is in NAD  Eyes: External eye exam normal without stare, lid lag or exophthalmos.  EOM intact. Confrontation testing abnormal B/L.   Neck: General: Supple without adenopathy. Thyroid: Thyroid size normal.  No goiter or nodules appreciated  Lungs: Clear with good BS bilat with no rales, rhonchi, or wheezes  Heart: Auscultation: RRR.  Abdomen: Normoactive bowel sounds, soft, nontender, without masses or organomegaly palpable  Extremities:  BL LE: No pretibial edema normal ROM and strength.  Skin: Hair: Texture and amount normal with gender appropriate distribution Skin Inspection: No rashes Skin Palpation: Skin temperature, texture, and thickness normal to palpation  Neuro: Cranial nerves: II - XII grossly intact  Motor: Normal strength throughout DTRs: 3+ and symmetric in UE without delay in relaxation phase  Mental Status: Judgment,  insight: Intact Orientation: Oriented to time, place, and person Mood and affect: No depression, anxiety, or agitation     DATA REVIEWED: Results for Daniel Reyes, Daniel Reyes (MRN [id number]) as of 03/25/2020 11:51  Ref. Range 03/24/2020 08:39  Sodium Latest Ref Range: 135 - 146 mmol/L 142  Potassium Latest Ref Range: 3.5 - 5.3 mmol/L 4.4  Chloride Latest Ref Range: 98 - 110 mmol/L 106  CO2 Latest Ref Range: 20 - 32 mmol/L 25  Glucose Latest Ref Range: 65 - 99 mg/dL 106 (H)  BUN Latest Ref Range: 7 - 25 mg/dL 23  Creatinine Latest Ref Range: 0.70 - 1.18 mg/dL 1.03  Calcium Latest Ref Range: 8.6 - 10.3 mg/dL 9.8  BUN/Creatinine Ratio Latest Ref Range: 6 - 22 (calc)  NOT APPLICABLE  Cortisol, Plasma Latest Units: mcg/dL 18.3  LH Latest Ref Range: 1.6 - 15.2 mIU/mL 2.5  FSH Latest Ref Range: 1.6 - 8.0 mIU/mL 7.7  Prolactin Latest Ref Range: 2.0 - 18.0 ng/mL 5.4  TSH Latest Ref Range: 0.40 - 4.50 mIU/L 1.36  T4,Free(Direct) Latest Ref Range: 0.8 - 1.8 ng/dL 1.3    IGF- 1, ACTH and testosterone are pending      CT head (without) demonstrating: 11/25/2019 - Pituitary gland is enlarged and measures 2.5x1.7x2.2cm (SI x AP x trans). Pituitary stalk is midline. Superior bulging potentially contacts the optic chiasm. 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 - No acute findings.   MRI 12/26/2019  Brain: Confirmed sellar mass with suprasellar extension, indistinguishable from a normal pituitary gland and solid with homogeneous enhancement. The mass measures 24 x 17 x 17 mm. There is contact of the optic chiasm without notable chiasmatic displacement. No indication of cavernous sinus invasion. No adjacent dural thickening.  Brain itself is unremarkable with no infarct, hemorrhage, hydrocephalus, or chronic blood products.  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 There is chiasmatic contact; no cavernous sinus invasion.   ASSESSMENT/PLAN/RECOMMENDATIONS:   1. Pituitary Macroadenoma, S/P transphenoidal pituitary resection on 02/14/2020   - Historically he has had fatigue, dizziness and hypotension. I am concerned about cortisol deficiency as well as hypothyroidism.  - Will proceed with labs pituitary check up  - Cortisol. TFT's prolactin, FSH and LH are all normal   -ACTH,IGF-1, and testosterone are pending  - NO pre-op endocrine work up as far as I can see in his chart ( no labs in care everywhere)  - Dynamic testing will depend on his labs results - No clinical suspicion for DI      F/U in 3 months     I spent 45 minutes preparing to see the patient by review of recent labs, imaging and procedures, obtaining and reviewing separately obtained history, communicating with the patient, ordering medications, tests or procedures, and documenting clinical information in the EHR including the differential Dx, treatment, and any further evaluation and other management      Signed electronically by: Mack Guise, MD  [facility]  [facility] [address] [place], [place] [address] Phone: [phone] FAX: [phone]   CC: Mosie Lukes, [address] [place] [address] Phone: [phone] Fax: [phone]   Return to Endocrinology clinic as below: Future Appointments  Date Time Provider [place]  05/25/2020  7:40 AM [facility] DEVICE REMOTES [facility]  08/24/2020  7:40 AM [facility] DEVICE REMOTES [facility]  09/14/2020  9:00 AM Mosie Lukes, MD [facility]  11/23/2020  7:40 AM [facility] DEVICE REMOTES [facility]  02/22/2021  7:40 AM [facility] DEVICE REMOTES [facility]  05/24/2021  7:40 AM [facility] DEVICE REMOTES [facility]  [facility]  08/23/2021  7:40 AM [facility] DEVICE REMOTES [facility]  11/22/2021  7:40 AM [facility] DEVICE REMOTES [facility]

## 2020-03-24 NOTE — Progress Notes (Signed)
   Covid-19 Vaccination Clinic  Name:  Daniel Reyes    MRN: [id number] DOB: 1945-11-11  03/24/2020  Daniel Reyes was observed post Covid-19 immunization for 15 minutes without incident. He was provided with Vaccine Information Sheet and instruction to access the V-Safe system. Vaccinated by [employer].  Daniel Reyes was instructed to call 911 with any severe reactions post vaccine: Marland Kitchen Difficulty breathing  . Swelling of face and throat  . A fast heartbeat  . A bad rash all over body  . Dizziness and weakness

## 2020-03-24 NOTE — Patient Instructions (Addendum)
-   Please stop by the lab today  

## 2020-03-25 ENCOUNTER — Encounter: Payer: Self-pay | Admitting: Internal Medicine

## 2020-03-26 LAB — BASIC METABOLIC PANEL
BUN: 23 mg/dL (ref 7–25)
CO2: 25 mmol/L (ref 20–32)
Calcium: 9.8 mg/dL (ref 8.6–10.3)
Chloride: 106 mmol/L (ref 98–110)
Creat: 1.03 mg/dL (ref 0.70–1.18)
Glucose, Bld: 106 mg/dL — ABNORMAL HIGH (ref 65–99)
Potassium: 4.4 mmol/L (ref 3.5–5.3)
Sodium: 142 mmol/L (ref 135–146)

## 2020-03-26 LAB — T4, FREE: Free T4: 1.3 ng/dL (ref 0.8–1.8)

## 2020-03-26 LAB — FOLLICLE STIMULATING HORMONE: FSH: 7.7 m[IU]/mL (ref 1.6–8.0)

## 2020-03-26 LAB — LUTEINIZING HORMONE: LH: 2.5 m[IU]/mL (ref 1.6–15.2)

## 2020-03-26 LAB — TESTOSTERONE, TOTAL, LC/MS/MS: Testosterone, Total, LC-MS-MS: 281 ng/dL (ref 250–1100)

## 2020-03-26 LAB — ACTH: C206 ACTH: 40 pg/mL (ref 6–50)

## 2020-03-26 LAB — PROLACTIN: Prolactin: 5.4 ng/mL (ref 2.0–18.0)

## 2020-03-26 LAB — TSH: TSH: 1.36 mIU/L (ref 0.40–4.50)

## 2020-03-26 LAB — CORTISOL: Cortisol, Plasma: 18.3 ug/dL

## 2020-03-27 ENCOUNTER — Other Ambulatory Visit (HOSPITAL_COMMUNITY): Payer: Self-pay | Admitting: Neurosurgery

## 2020-03-27 DIAGNOSIS — D497 Neoplasm of unspecified behavior of endocrine glands and other parts of nervous system: Secondary | ICD-10-CM

## 2020-03-30 LAB — INSULIN-LIKE GROWTH FACTOR
IGF-I, LC/MS: 68 ng/mL (ref 34–245)
Z-Score (Male): -0.8 SD (ref ?–2.0)

## 2020-04-09 ENCOUNTER — Ambulatory Visit (HOSPITAL_COMMUNITY)
Admission: RE | Admit: 2020-04-09 | Discharge: 2020-04-09 | Disposition: A | Discharge status: Home / Self Care | Payer: Medicare HMO | Source: Ambulatory Visit | Attending: Neurosurgery | Admitting: Neurosurgery

## 2020-04-09 ENCOUNTER — Other Ambulatory Visit: Payer: Self-pay

## 2020-04-09 DIAGNOSIS — J341 Cyst and mucocele of nose and nasal sinus: Secondary | ICD-10-CM | POA: Diagnosis not present

## 2020-04-09 DIAGNOSIS — Z9889 Other specified postprocedural states: Secondary | ICD-10-CM | POA: Diagnosis not present

## 2020-04-09 DIAGNOSIS — D497 Neoplasm of unspecified behavior of endocrine glands and other parts of nervous system: Secondary | ICD-10-CM | POA: Insufficient documentation

## 2020-04-09 DIAGNOSIS — D352 Benign neoplasm of pituitary gland: Secondary | ICD-10-CM | POA: Diagnosis not present

## 2020-04-09 IMAGING — MR MR HEAD WO/W CM
12 of 18 series · 37 of 48 positions shown · IV contrast (Yes   GAD)
Comparison: MRI [DATE].

CLINICAL DATA: Pituitary tumor.

EXAM:
MRI HEAD WITHOUT AND WITH CONTRAST
TECHNIQUE: Multiplanar, multiecho pulse sequences of the brain and surrounding
structures were obtained without and with intravenous contrast.
CONTRAST:  9mL GADAVIST GADOBUTROL 1 MMOL/ML IV SOLN

[Series 3: T1 · sagittal · 5.0mm · 0.98mm/px · 3 of 27 slices shown (1 of 3)]
[im 1/27]
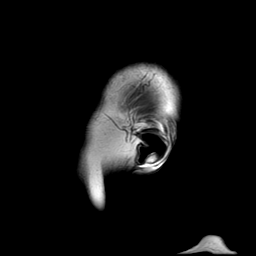
[im 14/27]
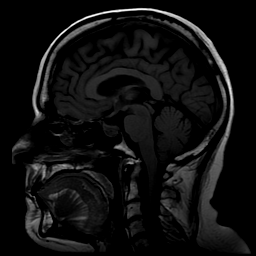
[im 27/27]
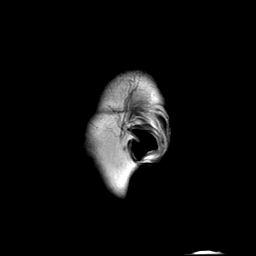

[Series 4: DWI · axial · 5.0mm · 1.09mm/px · z∈[-37,+113]mm · 8 of 62 slices shown]
[im 1/62]
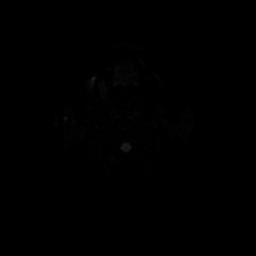
[im 9/62]
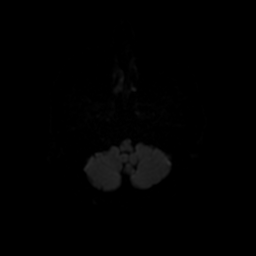
[im 18/62]
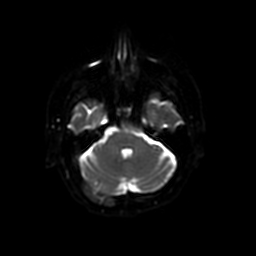
[im 27/62]
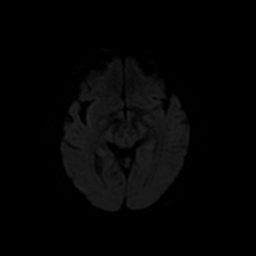
[im 35/62]
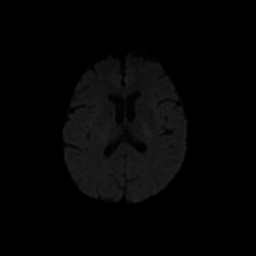
[im 44/62]
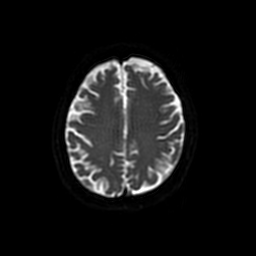
[im 53/62]
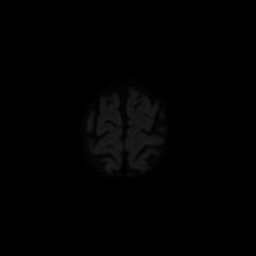
[im 62/62]
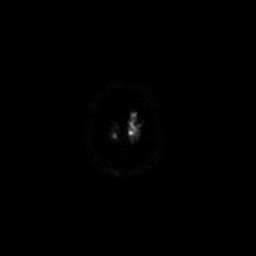

[Series 5: T2 · axial · 5.0mm · 0.43mm/px · z∈[-36,+111]mm · 3 of 22 slices shown (1 of 2)]
[im 1/22]
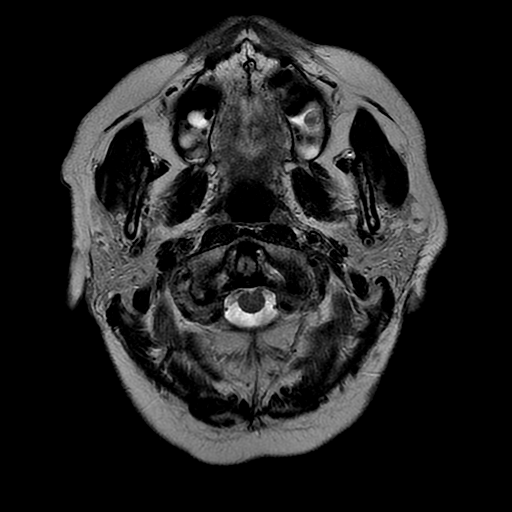
[im 11/22]
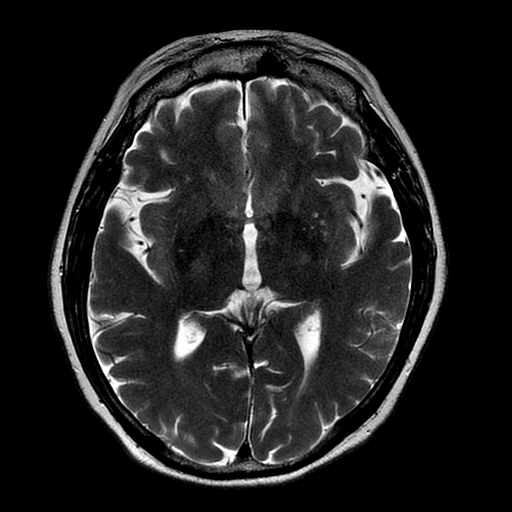
[im 22/22]
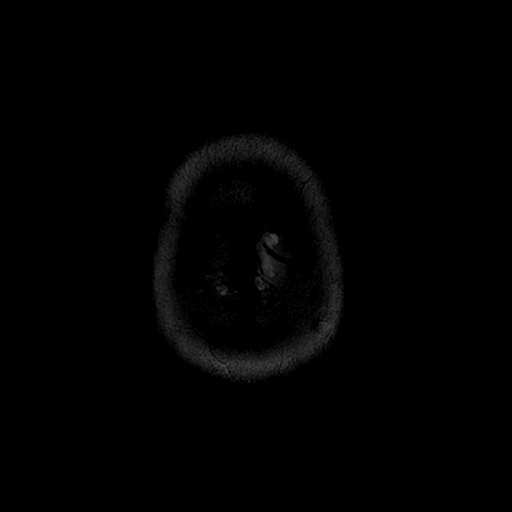

[Series 6: FLAIR · axial · 5.0mm · 0.86mm/px · z∈[-36,+111]mm · 3 of 22 slices shown]
[im 1/22]
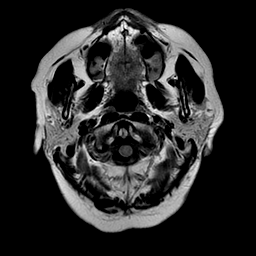
[im 11/22]
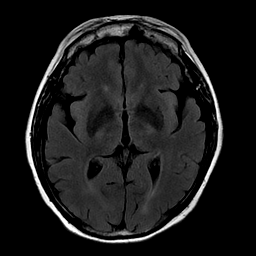
[im 22/22]
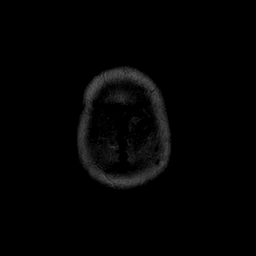

[Series 7: ax mpgr · axial · 5.0mm · 0.43mm/px · z∈[-36,+111]mm · 3 of 22 slices shown]
[im 1/22]
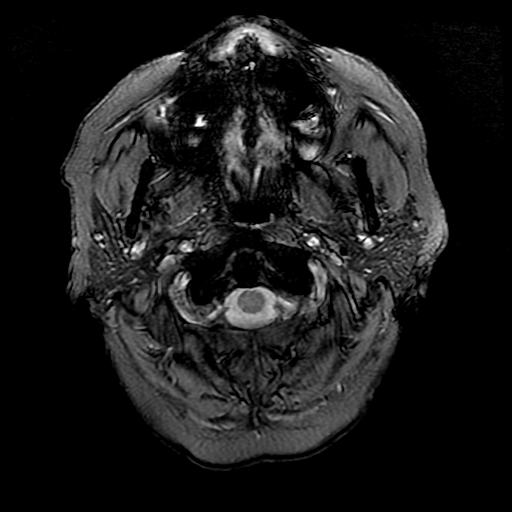
[im 11/22]
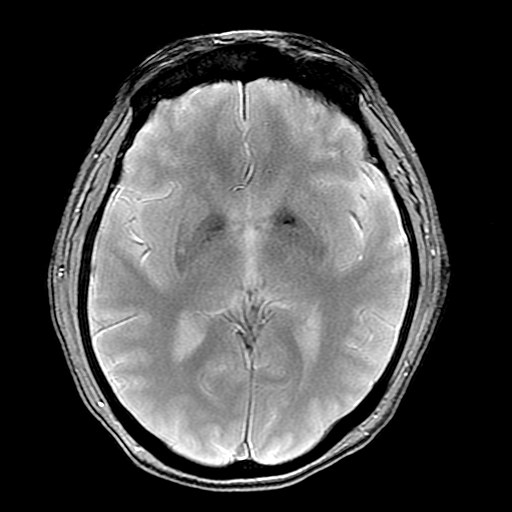
[im 22/22]
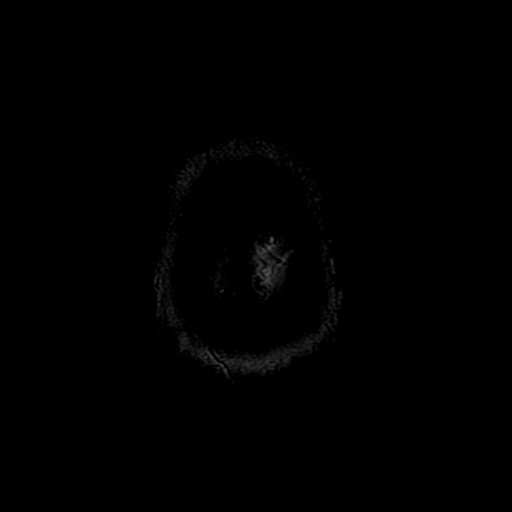

[Series 8: T2 · coronal · 5.0mm · 0.94mm/px · 4 of 29 slices shown (2 of 2)]
[im 1/29]
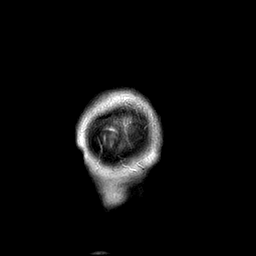
[im 10/29]
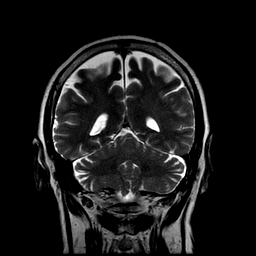
[im 19/29]
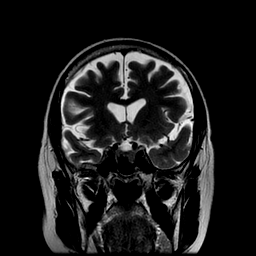
[im 29/29]
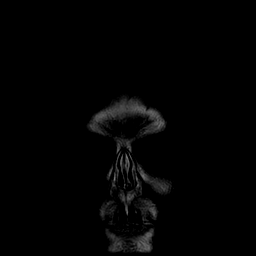

[Series 9: T1 · sagittal · 3.0mm · 0.66mm/px · 2 of 15 slices shown (2 of 3)]
[im 1/15]
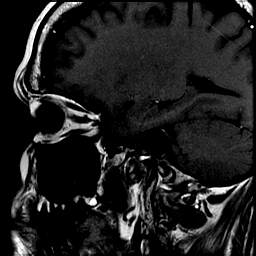
[im 15/15]
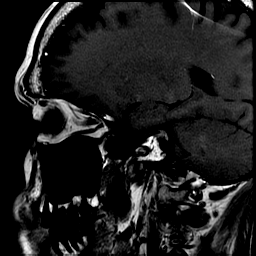

[Series 10: T1 · coronal · 3.0mm · 0.33mm/px · 2 of 13 slices shown (3 of 3)]
[im 1/13]
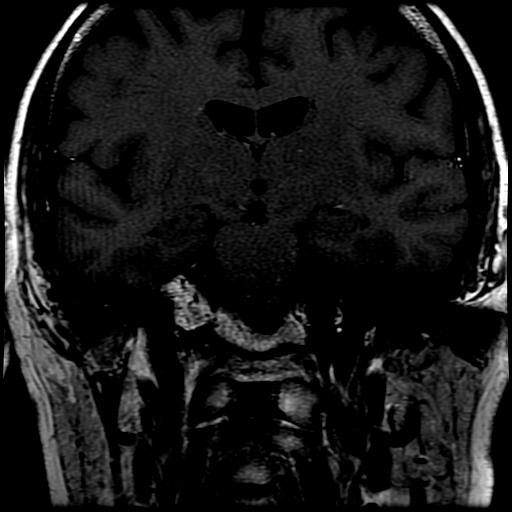
[im 13/13]
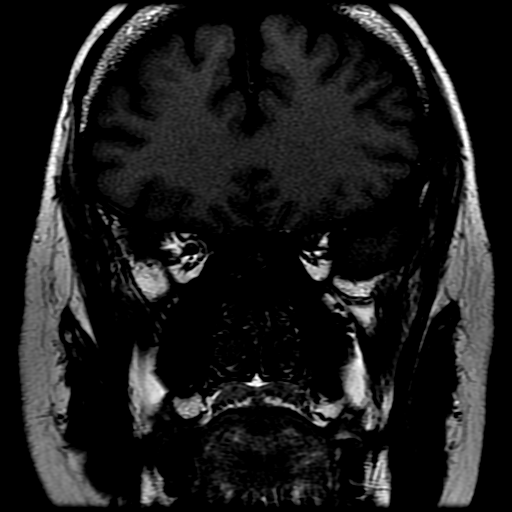

[Series 13: T1 post-contrast · coronal · 3.0mm · 0.33mm/px · 2 of 13 slices shown (1 of 3)]
[im 1/13]
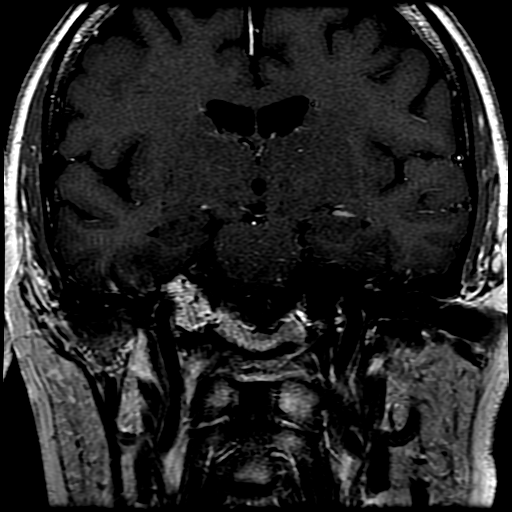
[im 13/13]
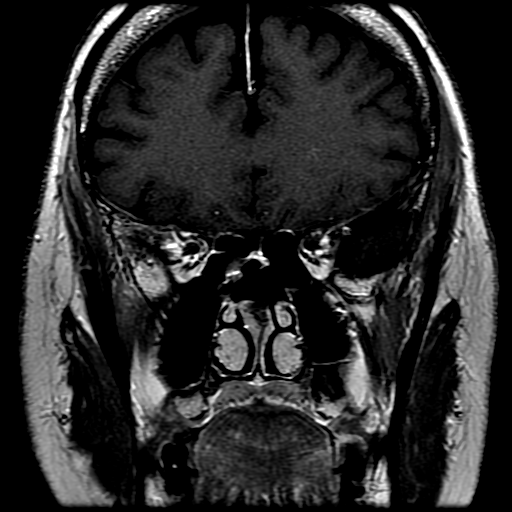

[Series 14: T1 post-contrast · sagittal · 3.0mm · 0.66mm/px · 2 of 15 slices shown (2 of 3)]
[im 1/15]
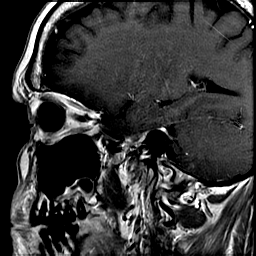
[im 15/15]
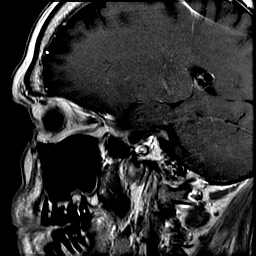

[Series 15: T1 post-contrast · coronal · 5.0mm · 0.86mm/px · 4 of 29 slices shown (3 of 3)]
[im 1/29]
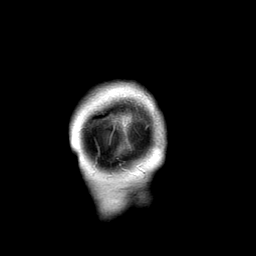
[im 10/29]
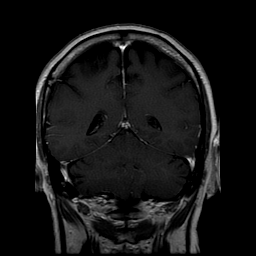
[im 19/29]
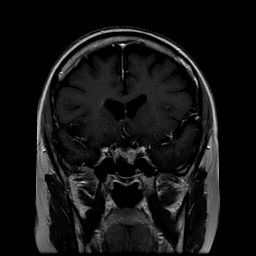
[im 29/29]
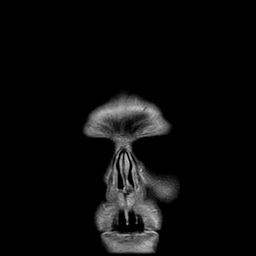

[Series 1100: dynamic pituitary · coronal · 3.0mm · 0.35mm/px · 1 of 11 slices shown]
[im 1/11]
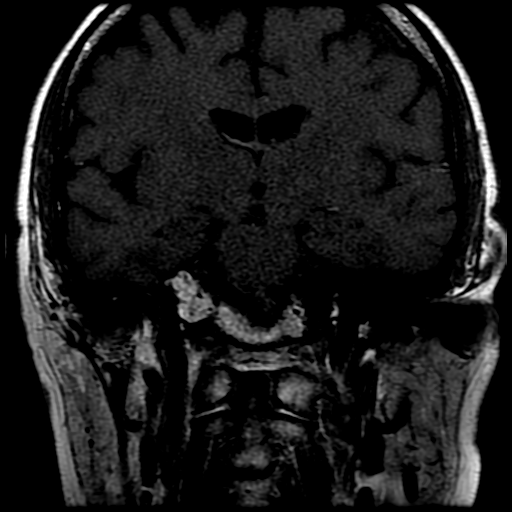

[37 of 48 positions shown; findings below may reference images not displayed]

FINDINGS: Brain: High-resolution imaging through the pituitary was performed
with contrast. This demonstrates postsurgical changes of
transsphenoidal resection of a pituitary macroadenoma. Residual
heterogeneously enhancing soft tissue within the sella measures
approximately 1.9 by 1.6 by 1.4 cm (AP by transverse by
craniocaudal). No substantial suprasellar extent of soft tissue.
Small amount of enhancement extends along the tuberculum sella. No
mass effect on the optic chiasm. Normal appearance of the
infundibulum, which is midline.

No acute hemorrhage. No hydrocephalus. No acute infarct.

Vascular: Major arterial flow voids are maintained at the skull
base.

Skull and upper cervical spine: Normal marrow signal.

Sinuses/Orbits: Inferior maxillary sinus retention cyst.
Unremarkable orbits.

Other: No mastoid effusions.
IMPRESSION: Status post transsphenoidal resection of a pituitary macroadenoma
with residual heterogeneously enhancing soft tissue within the sella
as detailed above. No substantial suprasellar extent and no mass
effect on the optic chiasm.

## 2020-04-09 MED ORDER — GADOBUTROL 1 MMOL/ML IV SOLN
9.0000 mL | Freq: Once | INTRAVENOUS | Status: AC | PRN
  Administered 2020-04-09: 9 mL via INTRAVENOUS

## 2020-04-13 DIAGNOSIS — R69 Illness, unspecified: Secondary | ICD-10-CM | POA: Diagnosis not present

## 2020-04-14 ENCOUNTER — Encounter: Payer: Self-pay | Admitting: Family Medicine

## 2020-04-14 DIAGNOSIS — Z86010 Personal history of colonic polyps: Secondary | ICD-10-CM | POA: Diagnosis not present

## 2020-04-14 DIAGNOSIS — K2270 Barrett's esophagus without dysplasia: Secondary | ICD-10-CM | POA: Diagnosis not present

## 2020-04-14 LAB — HM COLONOSCOPY

## 2020-04-17 ENCOUNTER — Other Ambulatory Visit: Payer: Self-pay | Admitting: Family Medicine

## 2020-04-27 DIAGNOSIS — L820 Inflamed seborrheic keratosis: Secondary | ICD-10-CM | POA: Diagnosis not present

## 2020-04-27 DIAGNOSIS — D1801 Hemangioma of skin and subcutaneous tissue: Secondary | ICD-10-CM | POA: Diagnosis not present

## 2020-05-25 ENCOUNTER — Ambulatory Visit (INDEPENDENT_AMBULATORY_CARE_PROVIDER_SITE_OTHER): Payer: Medicare HMO

## 2020-05-25 DIAGNOSIS — I495 Sick sinus syndrome: Secondary | ICD-10-CM | POA: Diagnosis not present

## 2020-05-25 LAB — CUP PACEART REMOTE DEVICE CHECK
Battery Remaining Longevity: 138 mo
Battery Remaining Percentage: 100 %
Brady Statistic RA Percent Paced: 74 %
Brady Statistic RV Percent Paced: 3 %
Date Time Interrogation Session: 20211213025100
Implantable Lead Implant Date: 20191203
Implantable Lead Implant Date: 20191203
Implantable Lead Location: 753859
Implantable Lead Location: 753860
Implantable Lead Model: 7740
Implantable Lead Model: 7741
Implantable Lead Serial Number: 1095486
Implantable Lead Serial Number: 711800
Implantable Pulse Generator Implant Date: 20191203
Lead Channel Impedance Value: 599 Ohm
Lead Channel Impedance Value: 654 Ohm
Lead Channel Pacing Threshold Amplitude: 0.4 V
Lead Channel Pacing Threshold Amplitude: 1 V
Lead Channel Pacing Threshold Pulse Width: 0.4 ms
Lead Channel Pacing Threshold Pulse Width: 0.4 ms
Lead Channel Setting Pacing Amplitude: 3.5 V
Lead Channel Setting Pacing Amplitude: 3.5 V
Lead Channel Setting Pacing Pulse Width: 0.4 ms
Lead Channel Setting Sensing Sensitivity: 2.5 mV
Pulse Gen Serial Number: 838922

## 2020-06-02 DIAGNOSIS — R69 Illness, unspecified: Secondary | ICD-10-CM | POA: Diagnosis not present

## 2020-06-09 NOTE — Progress Notes (Signed)
Remote pacemaker transmission.   

## 2020-06-30 ENCOUNTER — Ambulatory Visit: Payer: Medicare HMO | Admitting: Internal Medicine

## 2020-07-14 ENCOUNTER — Telehealth (INDEPENDENT_AMBULATORY_CARE_PROVIDER_SITE_OTHER): Payer: Medicare HMO | Admitting: Family

## 2020-07-14 ENCOUNTER — Other Ambulatory Visit: Payer: Self-pay

## 2020-07-14 DIAGNOSIS — U071 COVID-19: Secondary | ICD-10-CM | POA: Diagnosis not present

## 2020-07-14 NOTE — Progress Notes (Signed)
Virtual Visit via Telephone Note  I connected with Daniel Reyes on 07/14/20 at  4:40 PM EST by telephone and verified that I am speaking with the correct person using two identifiers.  Location: Patient: home Provider: work   I discussed the limitations, risks, security and privacy concerns of performing an evaluation and management service by telephone and the availability of in person appointments. I also discussed with the patient that there may be a patient responsible charge related to this service. The patient expressed understanding and agreed to proceed.   History of Present Illness:  Patient is a 75 yr old male who presents today to discuss covid-19 infection. Developed head and chest congestion 07/06/20. He traveled [place] to get on a cruise in [place] and tested positive twice yesterday, so he did not board and he returned home last night.  He reports + chest congestion today. Head congestion has improved and his cough is more dry at this time.  He denies sob, loss of taste or smell, fever, headache, nausea or diarrhea.  He has had 3 pfizer shots. Has tried tussin-DM. Mostly concerned about his chest congestion.     Observations/Objective:  Gen: awake, alert. Resp:  Speaking in full sentences, breathing sounds even and non-labored.   Assessment and Plan:  COVID-19 infection-  Of note, pt was upset that I called the cell number listed as it was his wife's cell number. I apologized and updated his cell number in the system to his personal cell number.   Overall his cough and symptoms are improving. He reports that he is afebrile.  He has an oxygen meter at home but did not have it handy at the time of today's visit. Recommended that he monitor his oxygen regularly for the next few days. He is advised to go to the ER if his oxygen drops <90% of if he develops shortness of breath.  I do not see indication for antibiotics at this time, but advised him to let us know if his symptoms do not  continue to improve and we can get him set up in our [facility]. In the meantime, recommended mucinex, hydration, rest and prn tylenol. Pt verbalizes understanding.  Follow Up Instructions:    I discussed the assessment and treatment plan with the patient. The patient was provided an opportunity to ask questions and all were answered. The patient agreed with the plan and demonstrated an understanding of the instructions.   The patient was advised to call back or seek an in-person evaluation if the symptoms worsen or if the condition fails to improve as anticipated.  I provided 12 minutes of non-face-to-face time during this encounter.   Nance Pear, NP

## 2020-07-20 DIAGNOSIS — Z03818 Encounter for observation for suspected exposure to other biological agents ruled out: Secondary | ICD-10-CM | POA: Diagnosis not present

## 2020-07-20 DIAGNOSIS — Z20822 Contact with and (suspected) exposure to covid-19: Secondary | ICD-10-CM | POA: Diagnosis not present

## 2020-08-04 ENCOUNTER — Encounter: Payer: Self-pay | Admitting: *Deleted

## 2020-08-24 ENCOUNTER — Ambulatory Visit (INDEPENDENT_AMBULATORY_CARE_PROVIDER_SITE_OTHER): Payer: Medicare HMO

## 2020-08-24 DIAGNOSIS — I495 Sick sinus syndrome: Secondary | ICD-10-CM

## 2020-08-26 LAB — CUP PACEART REMOTE DEVICE CHECK
Battery Remaining Longevity: 138 mo
Battery Remaining Percentage: 100 %
Brady Statistic RA Percent Paced: 74 %
Brady Statistic RV Percent Paced: 3 %
Date Time Interrogation Session: 20220314025100
Implantable Lead Implant Date: 20191203
Implantable Lead Implant Date: 20191203
Implantable Lead Location: 753859
Implantable Lead Location: 753860
Implantable Lead Model: 7740
Implantable Lead Model: 7741
Implantable Lead Serial Number: 1095486
Implantable Lead Serial Number: 711800
Implantable Pulse Generator Implant Date: 20191203
Lead Channel Impedance Value: 612 Ohm
Lead Channel Impedance Value: 665 Ohm
Lead Channel Pacing Threshold Amplitude: 0.4 V
Lead Channel Pacing Threshold Amplitude: 1.2 V
Lead Channel Pacing Threshold Pulse Width: 0.4 ms
Lead Channel Pacing Threshold Pulse Width: 0.4 ms
Lead Channel Setting Pacing Amplitude: 3.5 V
Lead Channel Setting Pacing Amplitude: 3.5 V
Lead Channel Setting Pacing Pulse Width: 0.4 ms
Lead Channel Setting Sensing Sensitivity: 2.5 mV
Pulse Gen Serial Number: 838922

## 2020-09-02 NOTE — Progress Notes (Signed)
Remote pacemaker transmission.   

## 2020-09-14 ENCOUNTER — Ambulatory Visit: Payer: Medicare HMO | Admitting: Family Medicine

## 2020-10-23 ENCOUNTER — Other Ambulatory Visit (HOSPITAL_COMMUNITY): Payer: Self-pay | Admitting: Neurosurgery

## 2020-10-23 DIAGNOSIS — D497 Neoplasm of unspecified behavior of endocrine glands and other parts of nervous system: Secondary | ICD-10-CM

## 2020-11-11 ENCOUNTER — Ambulatory Visit (HOSPITAL_COMMUNITY)
Admission: RE | Admit: 2020-11-11 | Discharge: 2020-11-11 | Disposition: A | Discharge status: Home / Self Care | Payer: Medicare HMO | Source: Ambulatory Visit | Attending: Neurosurgery | Admitting: Neurosurgery

## 2020-11-11 ENCOUNTER — Other Ambulatory Visit: Payer: Self-pay

## 2020-11-11 DIAGNOSIS — D496 Neoplasm of unspecified behavior of brain: Secondary | ICD-10-CM | POA: Diagnosis not present

## 2020-11-11 DIAGNOSIS — Z9889 Other specified postprocedural states: Secondary | ICD-10-CM | POA: Diagnosis not present

## 2020-11-11 DIAGNOSIS — D497 Neoplasm of unspecified behavior of endocrine glands and other parts of nervous system: Secondary | ICD-10-CM

## 2020-11-11 DIAGNOSIS — D352 Benign neoplasm of pituitary gland: Secondary | ICD-10-CM | POA: Diagnosis not present

## 2020-11-11 DIAGNOSIS — J341 Cyst and mucocele of nose and nasal sinus: Secondary | ICD-10-CM | POA: Diagnosis not present

## 2020-11-11 IMAGING — MR MR HEAD WO/W CM
9 of 20 series · 27 of 48 positions shown · IV contrast (Yes   GAD)
Comparison: MRI of the brain [DATE].

CLINICAL DATA: Pituitary tumor.

EXAM:
MRI HEAD WITHOUT AND WITH CONTRAST
TECHNIQUE: Multiplanar, multiecho pulse sequences of the brain and surrounding
structures were obtained without and with intravenous contrast.
CONTRAST:  9mL GADAVIST GADOBUTROL 1 MMOL/ML IV SOLN

[Series 3: DWI · axial · 3.0mm · 1.09mm/px · z∈[-83,+67]mm · 8 of 102 slices shown (1 of 2)]
[im 1/102]
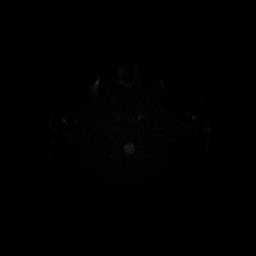
[im 15/102]
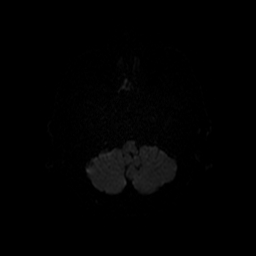
[im 29/102]
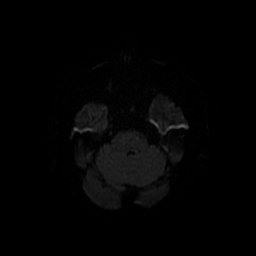
[im 44/102]
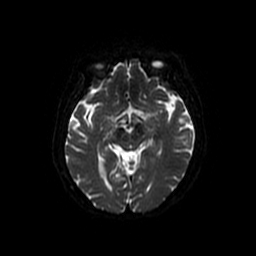
[im 58/102]
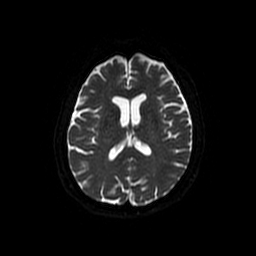
[im 73/102]
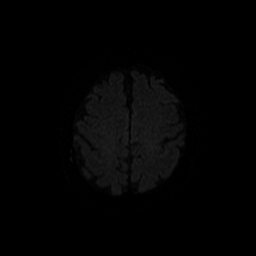
[im 87/102]
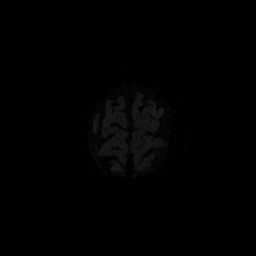
[im 102/102]
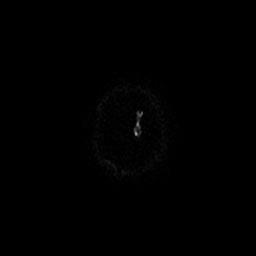

[Series 5: T2 · axial · 5.0mm · 0.43mm/px · z∈[-96,+59]mm · 2 of 27 slices shown (1 of 2)]
[im 1/27]
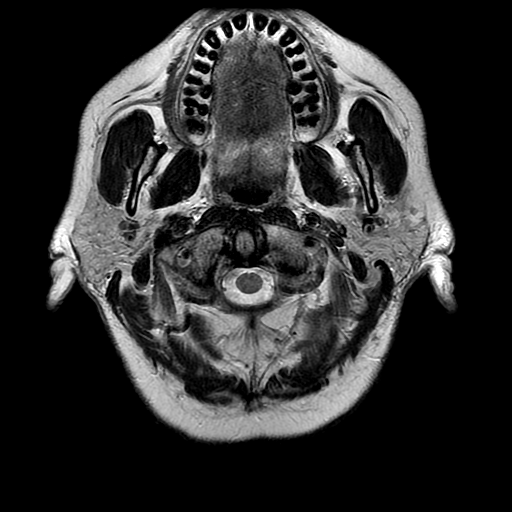
[im 27/27]
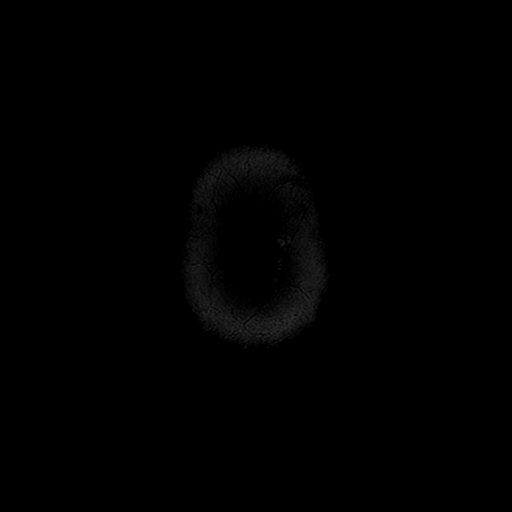

[Series 6: FLAIR · axial · 3.0mm · 0.43mm/px · z∈[-96,+59]mm · 2 of 27 slices shown]
[im 1/27]
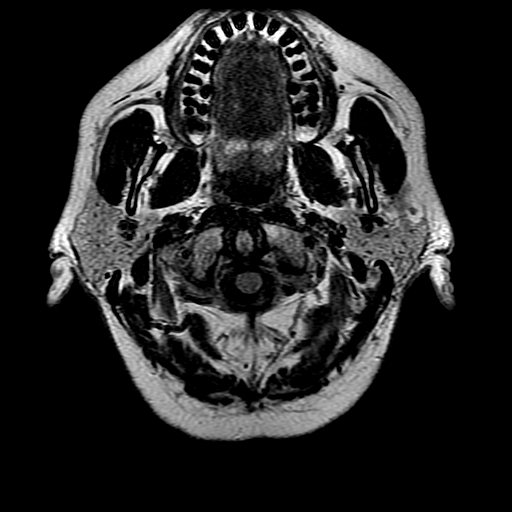
[im 27/27]
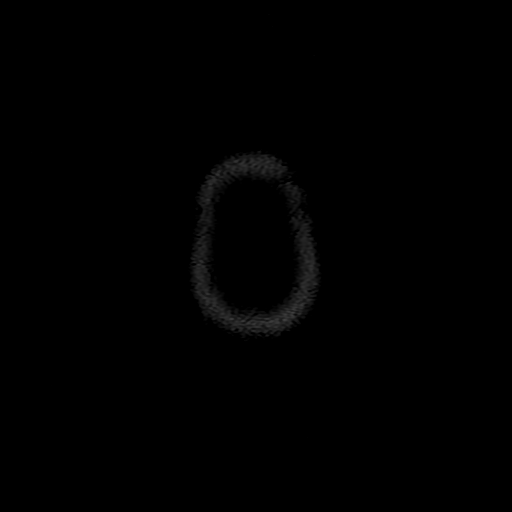

[Series 12: T1 post-contrast · sagittal · 3.0mm · 0.35mm/px · 1 of 11 slices shown (1 of 4)]
[im 1/11]
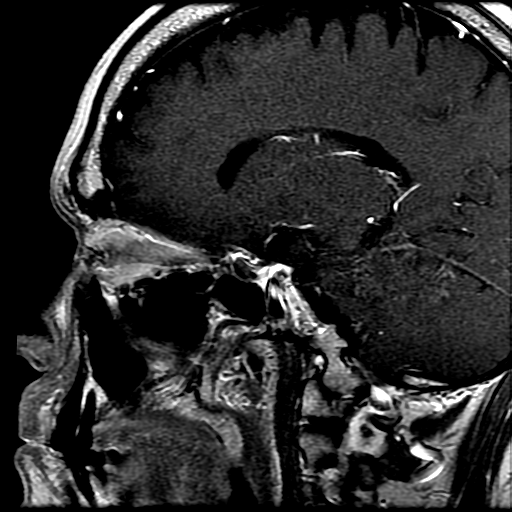

[Series 13: T1 post-contrast · coronal · 3.0mm · 0.35mm/px · 1 of 14 slices shown (2 of 4)]
[im 1/14]
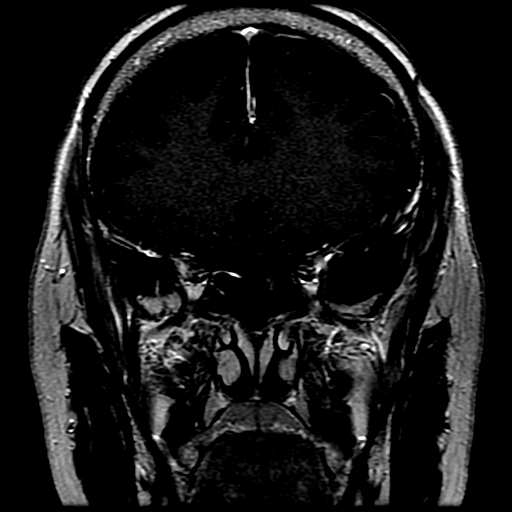

[Series 14: T2 · coronal · 3.0mm · 0.35mm/px · 1 of 14 slices shown (2 of 2)]
[im 1/14]
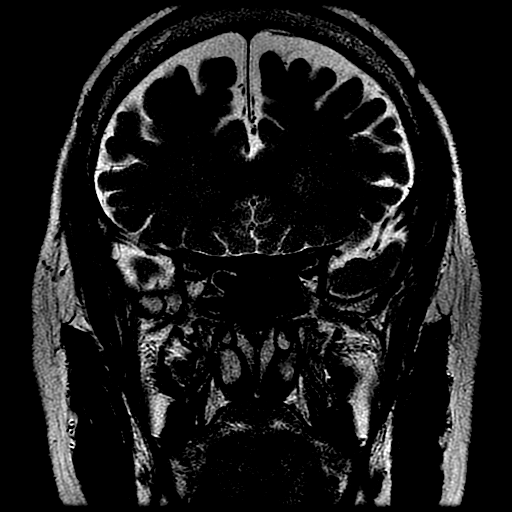

[Series 15: T1 post-contrast · axial · 3.0mm · 0.47mm/px · z∈[-98,+60]mm · 5 of 54 slices shown (3 of 4)]
[im 1/54]
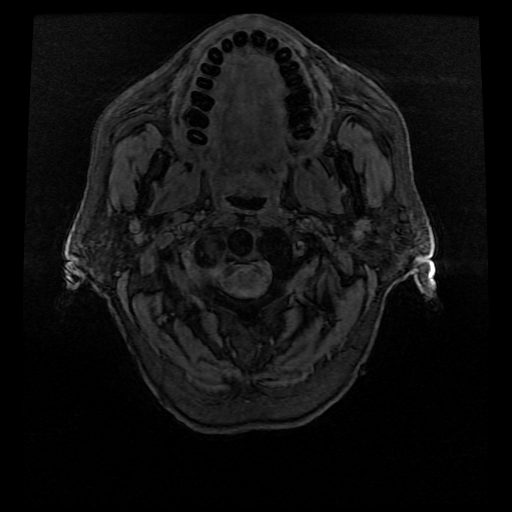
[im 14/54]
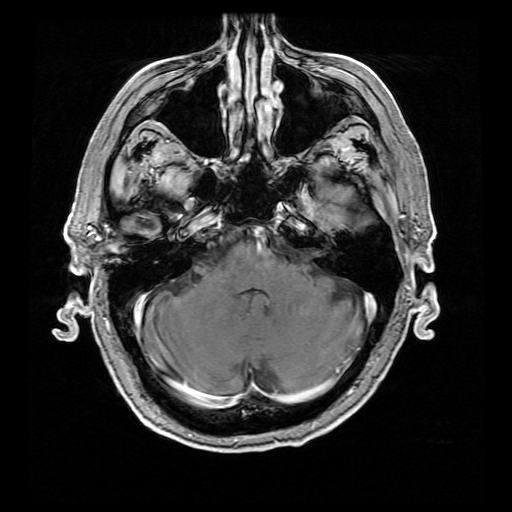
[im 27/54]
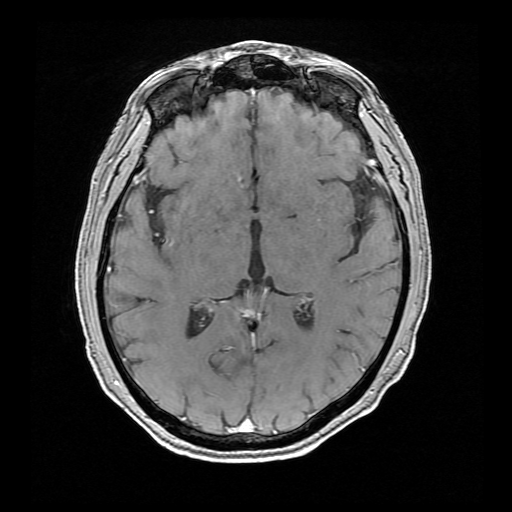
[im 40/54]
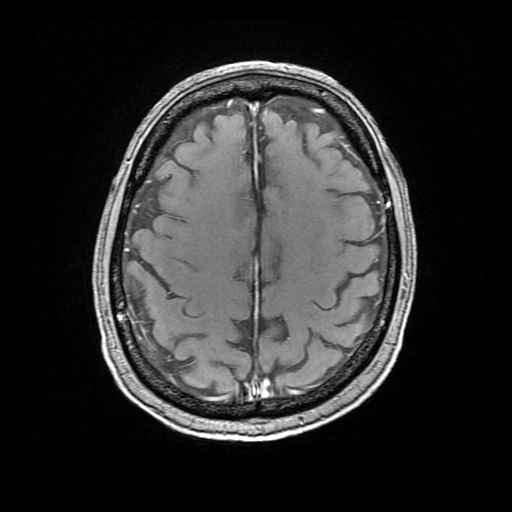
[im 54/54]
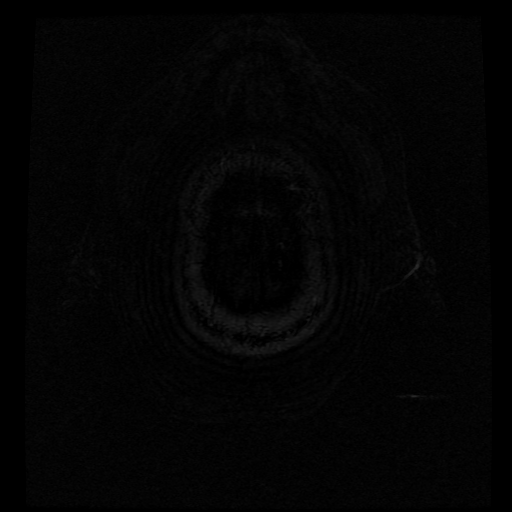

[Series 16: T1 post-contrast · coronal · 5.0mm · 0.43mm/px · 3 of 29 slices shown (4 of 4)]
[im 1/29]
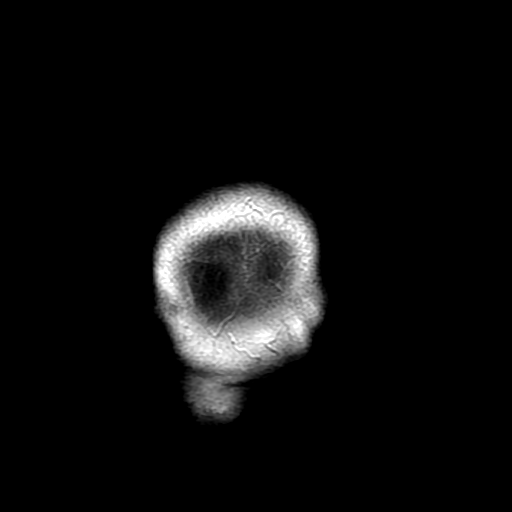
[im 15/29]
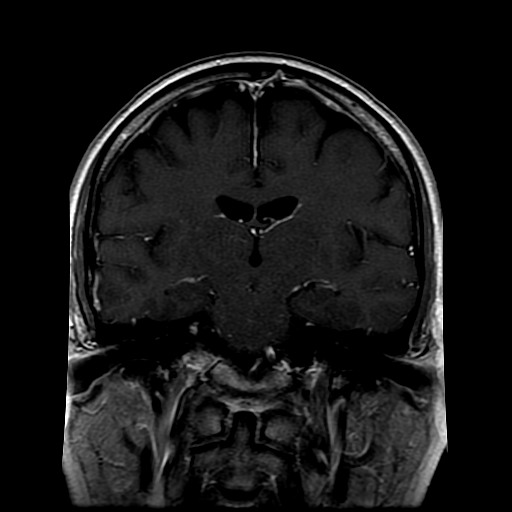
[im 29/29]
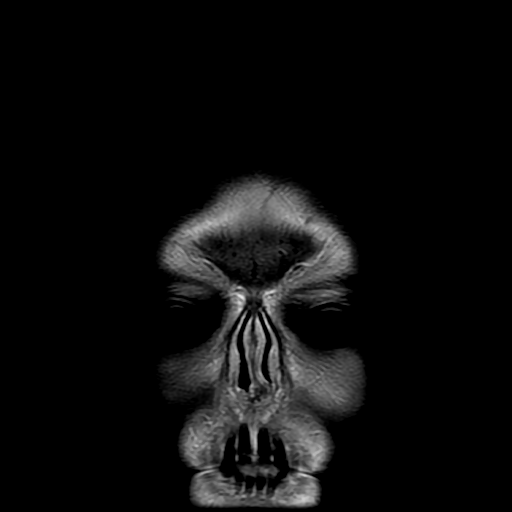

[Series 300: DWI · axial · 3.0mm · 1.09mm/px · z∈[-83,+67]mm · 4 of 51 slices shown (2 of 2)]
[im 1/51]
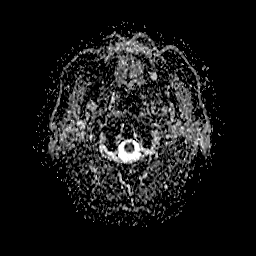
[im 17/51]
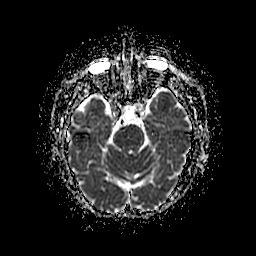
[im 34/51]
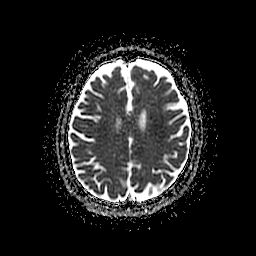
[im 51/51]
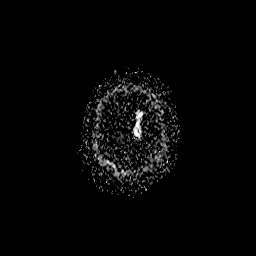

[27 of 48 positions shown; findings below may reference images not displayed]

FINDINGS: Brain: No acute infarction, hemorrhage, hydrocephalus, or
extra-axial collection.

Postsurgical changes from transsphenoidal a pituitary tumor
resection. There is significant interval decrease in size the area
of abnormal contrast enhancement within the sella which now is
predominantly hypoenhancing, occupying the inferior aspect of the
sella, and measuring approximately 1.2 x 1.1 x 0.5 cm compared to
2.4 x 1.7 x 1.7 cm on prior. Normal pituitary tissue is seen above
the area of hypoenhancement. The pituitary stalk is centered and has
normal thickness. The suprasellar cistern and cavernous sinuses are
unremarkable. Normal flow void within the cavernous internal carotid
arteries. Small the heterogeneous tissue in the sphenoid sinus is
likely related to packing.

Vascular: Normal flow voids.

Skull and upper cervical spine: Normal marrow signal.

Sinuses/Orbits: Small mucous retention cysts in the bilateral
maxillary sinuses. The orbits are maintained.
IMPRESSION: Interval decrease in size of the area of heterogeneous contrast
enhancement within the inferior aspect of sella, now predominantly
hypoenhancing, measuring up to 1.2 cm, compared to 2.4 cm on prior.

## 2020-11-11 MED ORDER — GADOBUTROL 1 MMOL/ML IV SOLN
9.0000 mL | Freq: Once | INTRAVENOUS | Status: AC | PRN
  Administered 2020-11-11: 9 mL via INTRAVENOUS

## 2020-11-11 NOTE — Progress Notes (Signed)
Per order, Changed device settings for MRI to AOO at 75 bpm   Tachy-therapies to off if applicable.   Will program device back to pre-MRI settings after completion of exam.

## 2020-11-11 NOTE — Progress Notes (Signed)
Informed of MRI for today.   Device system confirmed to be MRI conditional, with implant date > 6 weeks ago and no evidence of abandoned or epicardial leads in review of most recent CXR Interrogation from today by [employer] rep with recommendation of AOO 75 fur the duration of procedure.  Change device settings for MRI to AOO at 75 bpm  Tachy-therapies to off if applicable.  Program device back to pre-MRI settings after completion of exam.  Shirley Friar, PA-C  11/11/2020 1:16 PM

## 2020-11-23 ENCOUNTER — Ambulatory Visit (INDEPENDENT_AMBULATORY_CARE_PROVIDER_SITE_OTHER): Payer: Medicare HMO

## 2020-11-23 DIAGNOSIS — I495 Sick sinus syndrome: Secondary | ICD-10-CM | POA: Diagnosis not present

## 2020-11-24 LAB — CUP PACEART REMOTE DEVICE CHECK
Battery Remaining Longevity: 126 mo
Battery Remaining Percentage: 100 %
Brady Statistic RA Percent Paced: 74 %
Brady Statistic RV Percent Paced: 3 %
Date Time Interrogation Session: 20220613223400
Implantable Lead Implant Date: 20191203
Implantable Lead Implant Date: 20191203
Implantable Lead Location: 753859
Implantable Lead Location: 753860
Implantable Lead Model: 7740
Implantable Lead Model: 7741
Implantable Lead Serial Number: 1095486
Implantable Lead Serial Number: 711800
Implantable Pulse Generator Implant Date: 20191203
Lead Channel Impedance Value: 600 Ohm
Lead Channel Impedance Value: 677 Ohm
Lead Channel Pacing Threshold Amplitude: 0.4 V
Lead Channel Pacing Threshold Amplitude: 1.3 V
Lead Channel Pacing Threshold Pulse Width: 0.4 ms
Lead Channel Pacing Threshold Pulse Width: 0.4 ms
Lead Channel Setting Pacing Amplitude: 3.5 V
Lead Channel Setting Pacing Amplitude: 3.5 V
Lead Channel Setting Pacing Pulse Width: 0.4 ms
Lead Channel Setting Sensing Sensitivity: 2.5 mV
Pulse Gen Serial Number: 838922

## 2020-12-01 DIAGNOSIS — D497 Neoplasm of unspecified behavior of endocrine glands and other parts of nervous system: Secondary | ICD-10-CM | POA: Diagnosis not present

## 2020-12-01 DIAGNOSIS — Z6828 Body mass index (BMI) 28.0-28.9, adult: Secondary | ICD-10-CM | POA: Diagnosis not present

## 2020-12-15 NOTE — Progress Notes (Signed)
Remote pacemaker transmission.   

## 2021-02-18 ENCOUNTER — Encounter: Payer: Self-pay | Admitting: Family Medicine

## 2021-02-18 ENCOUNTER — Ambulatory Visit (INDEPENDENT_AMBULATORY_CARE_PROVIDER_SITE_OTHER): Payer: Medicare HMO | Admitting: Family Medicine

## 2021-02-18 ENCOUNTER — Other Ambulatory Visit: Payer: Self-pay

## 2021-02-18 VITALS — BP 112/76 | HR 60 | Temp 98.1°F | Resp 16 | Wt 188.6 lb

## 2021-02-18 DIAGNOSIS — D649 Anemia, unspecified: Secondary | ICD-10-CM

## 2021-02-18 DIAGNOSIS — E893 Postprocedural hypopituitarism: Secondary | ICD-10-CM

## 2021-02-18 DIAGNOSIS — E782 Mixed hyperlipidemia: Secondary | ICD-10-CM | POA: Diagnosis not present

## 2021-02-18 DIAGNOSIS — R739 Hyperglycemia, unspecified: Secondary | ICD-10-CM

## 2021-02-18 DIAGNOSIS — D497 Neoplasm of unspecified behavior of endocrine glands and other parts of nervous system: Secondary | ICD-10-CM | POA: Diagnosis not present

## 2021-02-18 DIAGNOSIS — I2510 Atherosclerotic heart disease of native coronary artery without angina pectoris: Secondary | ICD-10-CM

## 2021-02-18 LAB — LIPID PANEL
Cholesterol: 253 mg/dL — ABNORMAL HIGH (ref 0–200)
HDL: 51.3 mg/dL (ref >39.00)
LDL Cholesterol: 177 mg/dL — ABNORMAL HIGH (ref 0–99)
NonHDL: 201.84
Total CHOL/HDL Ratio: 5
Triglycerides: 123 mg/dL (ref 0.0–149.0)
VLDL: 24.6 mg/dL (ref 0.0–40.0)

## 2021-02-18 LAB — COMPREHENSIVE METABOLIC PANEL
ALT: 22 U/L (ref 0–53)
AST: 18 U/L (ref 0–37)
Albumin: 4.2 g/dL (ref 3.5–5.2)
Alkaline Phosphatase: 53 U/L (ref 39–117)
BUN: 19 mg/dL (ref 6–23)
CO2: 27 mEq/L (ref 19–32)
Calcium: 9.4 mg/dL (ref 8.4–10.5)
Chloride: 106 mEq/L (ref 96–112)
Creatinine, Ser: 1.08 mg/dL (ref 0.40–1.50)
GFR: 67.77 mL/min (ref >60.00)
Glucose, Bld: 86 mg/dL (ref 70–99)
Potassium: 5 mEq/L (ref 3.5–5.1)
Sodium: 139 mEq/L (ref 135–145)
Total Bilirubin: 0.8 mg/dL (ref 0.2–1.2)
Total Protein: 7.4 g/dL (ref 6.0–8.3)

## 2021-02-18 LAB — CBC
HCT: 42.3 % (ref 39.0–52.0)
Hemoglobin: 14.1 g/dL (ref 13.0–17.0)
MCHC: 33.4 g/dL (ref 30.0–36.0)
MCV: 84.2 fl (ref 78.0–100.0)
Platelets: 218 10*3/uL (ref 150.0–400.0)
RBC: 5.03 Mil/uL (ref 4.22–5.81)
RDW: 14.5 % (ref 11.5–15.5)
WBC: 5.2 10*3/uL (ref 4.0–10.5)

## 2021-02-18 LAB — TSH: TSH: 1.54 u[IU]/mL (ref 0.35–5.50)

## 2021-02-18 LAB — HEMOGLOBIN A1C: Hgb A1c MFr Bld: 5.7 % (ref 4.6–6.5)

## 2021-02-18 MED ORDER — ALBUTEROL SULFATE HFA 108 (90 BASE) MCG/ACT IN AERS: 2.0000 | INHALATION_SPRAY | Freq: Four times a day (QID) | RESPIRATORY_TRACT | 2 refills | Status: DC | PRN

## 2021-02-18 NOTE — Patient Instructions (Signed)

## 2021-02-18 NOTE — Progress Notes (Signed)
Patient ID: Daniel Reyes, male    DOB: Dec 22, 1945  Age: 75 y.o. MRN: [id number]    Subjective:   Chief Complaint  Patient presents with   6 months f/u   Subjective   HPI Daniel Reyes presents for office visit today for follow up on sleep apnea and medication management. He states that he is doing great this morning and has no recent febrile sicknesses or ER visits to report. He states that before the surgery he was in poor health lasting 6-8 years and only felt better after surgery by. Dr. Christella Noa. He reports that he had also lost some weight. Wt Readings from Last 3 Encounters:  02/18/21 188 lb 9.6 oz (85.5 kg)  03/24/20 199 lb (90.3 kg)  03/12/20 197 lb 9.6 oz (89.6 kg)  Denies CP/palp/SOB/HA/congestion/fevers/GI or GU c/o. Currently, he is not taking his prescribed medications.  Had minor cough for 3-4 days when he got covid in February. No left over symptoms of cough, fatigue, or loss of taste. He states that his breathing has been good, but he wants an albuterol refill just in case. At the moment, he is not using his CPAP and does not plan on getting a sleep study again. He states that his sleep study had only showed mild sleep apnea.  Review of Systems  Constitutional:  Negative for chills, fatigue and fever.  HENT:  Negative for congestion, rhinorrhea, sinus pressure, sinus pain and sore throat.   Eyes:  Negative for pain.  Respiratory:  Negative for cough and shortness of breath.   Cardiovascular:  Negative for chest pain, palpitations and leg swelling.  Gastrointestinal:  Negative for abdominal pain, blood in stool, diarrhea, nausea and vomiting.  Genitourinary:  Negative for flank pain, frequency and penile pain.  Musculoskeletal:  Negative for back pain.  Neurological:  Negative for headaches.   History Past Medical History:  Diagnosis Date   Dyspnea    GERD (gastroesophageal reflux disease)    History of chicken pox    Hyperlipemia    Mild CAD    a. cath 02/09/17 -  Mild to moderate proximal and mid LAD plaque with less than 40% narrowing   Nocturnal hypoxia    Presence of permanent cardiac pacemaker 2019   approximately 2 years ago    He has a past surgical history that includes Cholecystectomy open (1998); Neck surgery (1983); gerd surgery; RIGHT/LEFT HEART CATH AND CORONARY ANGIOGRAPHY (N/A, 02/09/2017); Tonsillectomy and adenoidectomy; Back surgery (1980); PACEMAKER IMPLANT (N/A, 05/15/2018); Tonsillectomy; Insert / replace / remove pacemaker; Cardiac catheterization (02/09/2017); Craniotomy (N/A, 02/14/2020); and Transphenoidal approach exposure (N/A, 02/14/2020).   His family history includes Alzheimer's disease in his mother and paternal grandmother; Cancer in his father; Colon cancer in his father; Heart attack in his father; Heart disease in his brother; Mental illness in his maternal grandfather; Peripheral vascular disease in his paternal grandfather; Stroke in his brother and maternal grandmother.He reports that he has never smoked. He has never used smokeless tobacco. He reports current alcohol use. He reports that he does not use drugs.  No current outpatient medications on file prior to visit.   No current facility-administered medications on file prior to visit.     Objective:  Objective  Physical Exam Constitutional:      General: He is not in acute distress.    Appearance: Normal appearance. He is not ill-appearing or toxic-appearing.  HENT:     Head: Normocephalic and atraumatic.     Right Ear: Tympanic membrane,  ear canal and external ear normal.     Left Ear: Tympanic membrane, ear canal and external ear normal.     Nose: No congestion or rhinorrhea.  Eyes:     Extraocular Movements: Extraocular movements intact.     Pupils: Pupils are equal, round, and reactive to light.  Cardiovascular:     Rate and Rhythm: Normal rate and regular rhythm.     Pulses: Normal pulses.     Heart sounds: Normal heart sounds. No murmur  heard. Pulmonary:     Effort: Pulmonary effort is normal. No respiratory distress.     Breath sounds: Normal breath sounds. No wheezing, rhonchi or rales.  Abdominal:     General: Bowel sounds are normal.     Palpations: Abdomen is soft. There is no mass.     Tenderness: There is no abdominal tenderness. There is no guarding.     Hernia: No hernia is present.  Musculoskeletal:        General: Normal range of motion.     Cervical back: Normal range of motion and neck supple.  Skin:    General: Skin is warm and dry.  Neurological:     Mental Status: He is alert and oriented to person, place, and time.  Psychiatric:        Behavior: Behavior normal.   BP 112/76   Pulse 60   Temp 98.1 F (36.7 C)   Resp 16   Wt 188 lb 9.6 oz (85.5 kg)   SpO2 99%   BMI 29.54 kg/m  Wt Readings from Last 3 Encounters:  02/18/21 188 lb 9.6 oz (85.5 kg)  03/24/20 199 lb (90.3 kg)  03/12/20 197 lb 9.6 oz (89.6 kg)     Lab Results  Component Value Date   WBC 5.2 02/18/2021   HGB 14.1 02/18/2021   HCT 42.3 02/18/2021   PLT 218.0 02/18/2021   GLUCOSE 86 02/18/2021   CHOL 253 (H) 02/18/2021   TRIG 123.0 02/18/2021   HDL 51.30 02/18/2021   LDLCALC 177 (H) 02/18/2021   ALT 22 02/18/2021   AST 18 02/18/2021   NA 139 02/18/2021   K 5.0 02/18/2021   CL 106 02/18/2021   CREATININE 1.08 02/18/2021   BUN 19 02/18/2021   CO2 27 02/18/2021   TSH 1.54 02/18/2021   PSA 0.53 03/12/2020   INR 1.05 02/08/2017   HGBA1C 5.7 02/18/2021    MR BRAIN W WO CONTRAST  Result Date: 11/11/2020 CLINICAL DATA:  Pituitary tumor. EXAM: MRI HEAD WITHOUT AND WITH CONTRAST TECHNIQUE: Multiplanar, multiecho pulse sequences of the brain and surrounding structures were obtained without and with intravenous contrast. CONTRAST:  34m GADAVIST GADOBUTROL 1 MMOL/ML IV SOLN COMPARISON:  MRI of the brain April 09, 2020. FINDINGS: Brain: No acute infarction, hemorrhage, hydrocephalus, or extra-axial collection. Postsurgical  changes from transsphenoidal a pituitary tumor resection. There is significant interval decrease in size the area of abnormal contrast enhancement within the sella which now is predominantly hypoenhancing, occupying the inferior aspect of the sella, and measuring approximately 1.2 x 1.1 x 0.5 cm compared to 2.4 x 1.7 x 1.7 cm on prior. Normal pituitary tissue is seen above the area of hypoenhancement. The pituitary stalk is centered and has normal thickness. The suprasellar cistern and cavernous sinuses are unremarkable. Normal flow void within the cavernous internal carotid arteries. Small the heterogeneous tissue in the sphenoid sinus is likely related to packing. Vascular: Normal flow voids. Skull and upper cervical spine: Normal marrow signal. Sinuses/Orbits: Small  mucous retention cysts in the bilateral maxillary sinuses. The orbits are maintained. IMPRESSION: Interval decrease in size of the area of heterogeneous contrast enhancement within the inferior aspect of sella, now predominantly hypoenhancing, measuring up to 1.2 cm, compared to 2.4 cm on prior. Electronically Signed   By: Pedro Earls M.D.   On: 11/11/2020 17:30     Assessment & Plan:  Plan    Meds ordered this encounter  Medications   albuterol (VENTOLIN HFA) 108 (90 Base) MCG/ACT inhaler    Sig: Inhale 2 puffs into the lungs every 6 (six) hours as needed for wheezing or shortness of breath.    Dispense:  1 each    Refill:  2     Problem List Items Addressed This Visit     Hyperlipidemia - Primary    Tolerating statin, encouraged heart healthy diet, avoid trans fats, minimize simple carbs and saturated fats. Increase exercise as tolerated      Relevant Orders   Lipid panel (Completed)   Mild CAD   Relevant Orders   TSH (Completed)   Hyperglycemia    hgba1c acceptable, minimize simple carbs. Increase exercise as tolerated.       Relevant Orders   Comprehensive metabolic panel (Completed)   TSH  (Completed)   Hemoglobin A1c (Completed)   Anemia    Increase leafy greens, consider increased lean red meat and using cast iron cookware. Continue to monitor, report any concerns      Relevant Orders   CBC (Completed)   Status post transsphenoidal pituitary resection [facility])    He feels much better since his surgery and offers no concerns today      RESOLVED: Pituitary tumor    After surgery he feels fully recovered and has not concerns today.        Follow-up: Return in about 6 months (around 08/18/2021) for annual exam.  I, Suezanne Jacquet, acting as a scribe for Penni Homans, MD, have documented all relevent documentation on behalf of Penni Homans, MD, as directed by Penni Homans, MD while in the presence of Penni Homans, MD. DO:02/21/21.  I, Mosie Lukes, MD personally performed the services described in this documentation. All medical record entries made by the scribe were at my direction and in my presence. I have reviewed the chart and agree that the record reflects my personal performance and is accurate and complete

## 2021-02-19 ENCOUNTER — Other Ambulatory Visit: Payer: Self-pay

## 2021-02-19 MED ORDER — ATORVASTATIN CALCIUM 10 MG PO TABS: 10.0000 mg | ORAL_TABLET | Freq: Every day | ORAL | 1 refills | Status: DC

## 2021-02-21 NOTE — Assessment & Plan Note (Signed)
Tolerating statin, encouraged heart healthy diet, avoid trans fats, minimize simple carbs and saturated fats. Increase exercise as tolerated 

## 2021-02-21 NOTE — Assessment & Plan Note (Signed)
After surgery he feels fully recovered and has not concerns today.

## 2021-02-21 NOTE — Assessment & Plan Note (Signed)
Increase leafy greens, consider increased lean red meat and using cast iron cookware. Continue to monitor, report any concerns 

## 2021-02-21 NOTE — Assessment & Plan Note (Signed)
He feels much better since his surgery and offers no concerns today

## 2021-02-21 NOTE — Assessment & Plan Note (Signed)
hgba1c acceptable, minimize simple carbs. Increase exercise as tolerated.  

## 2021-02-22 ENCOUNTER — Ambulatory Visit (INDEPENDENT_AMBULATORY_CARE_PROVIDER_SITE_OTHER): Payer: Medicare HMO

## 2021-02-22 DIAGNOSIS — I495 Sick sinus syndrome: Secondary | ICD-10-CM

## 2021-02-22 LAB — CUP PACEART REMOTE DEVICE CHECK
Battery Remaining Longevity: 126 mo
Battery Remaining Percentage: 100 %
Brady Statistic RA Percent Paced: 74 %
Brady Statistic RV Percent Paced: 3 %
Date Time Interrogation Session: 20220912025100
Implantable Lead Implant Date: 20191203
Implantable Lead Implant Date: 20191203
Implantable Lead Location: 753859
Implantable Lead Location: 753860
Implantable Lead Model: 7740
Implantable Lead Model: 7741
Implantable Lead Serial Number: 1095486
Implantable Lead Serial Number: 711800
Implantable Pulse Generator Implant Date: 20191203
Lead Channel Impedance Value: 592 Ohm
Lead Channel Impedance Value: 657 Ohm
Lead Channel Pacing Threshold Amplitude: 0.4 V
Lead Channel Pacing Threshold Amplitude: 1.1 V
Lead Channel Pacing Threshold Pulse Width: 0.4 ms
Lead Channel Pacing Threshold Pulse Width: 0.4 ms
Lead Channel Setting Pacing Amplitude: 3.5 V
Lead Channel Setting Pacing Amplitude: 3.5 V
Lead Channel Setting Pacing Pulse Width: 0.4 ms
Lead Channel Setting Sensing Sensitivity: 2.5 mV
Pulse Gen Serial Number: 838922

## 2021-03-02 NOTE — Progress Notes (Signed)
Remote pacemaker transmission.   

## 2021-05-19 ENCOUNTER — Ambulatory Visit: Payer: Medicare HMO | Attending: Internal Medicine

## 2021-05-19 ENCOUNTER — Other Ambulatory Visit (HOSPITAL_BASED_OUTPATIENT_CLINIC_OR_DEPARTMENT_OTHER): Payer: Self-pay

## 2021-05-19 DIAGNOSIS — Z23 Encounter for immunization: Secondary | ICD-10-CM

## 2021-05-19 MED ORDER — FLUAD QUADRIVALENT 0.5 ML IM PRSY
PREFILLED_SYRINGE | INTRAMUSCULAR | 0 refills | Status: DC
Start: 2021-05-19 — End: 2022-07-05
  Filled 2021-05-19: qty 0.5, 1d supply, fill #0

## 2021-05-19 NOTE — Progress Notes (Signed)
   Covid-19 Vaccination Clinic  Name:  DOUGLASS DUNSHEE    MRN: [id number] DOB: [birth year]  05/19/2021  Mr. Renaldo was observed post Covid-19 immunization for 15 minutes without incident. He was provided with Vaccine Information Sheet and instruction to access the V-Safe system.   Mr. Mcclintock was instructed to call 911 with any severe reactions post vaccine: Difficulty breathing  Swelling of face and throat  A fast heartbeat  A bad rash all over body  Dizziness and weakness   Immunizations Administered     Name Date Dose VIS Date Route   Pfizer Covid-19 Vaccine Bivalent Booster 05/19/2021  1:34 PM 0.3 mL 02/10/2021 Intramuscular   Manufacturer: [place]   Lot: [id number]   [place]: [phone]

## 2021-05-20 ENCOUNTER — Other Ambulatory Visit (HOSPITAL_BASED_OUTPATIENT_CLINIC_OR_DEPARTMENT_OTHER): Payer: Self-pay

## 2021-05-20 MED ORDER — PFIZER COVID-19 VAC BIVALENT 30 MCG/0.3ML IM SUSP
INTRAMUSCULAR | 0 refills | Status: DC
Start: 2021-05-19 — End: 2022-07-05
  Filled 2021-05-20: qty 0.3, 1d supply, fill #0

## 2021-05-24 ENCOUNTER — Ambulatory Visit (INDEPENDENT_AMBULATORY_CARE_PROVIDER_SITE_OTHER): Payer: Medicare HMO

## 2021-05-24 DIAGNOSIS — D225 Melanocytic nevi of trunk: Secondary | ICD-10-CM | POA: Diagnosis not present

## 2021-05-24 DIAGNOSIS — I495 Sick sinus syndrome: Secondary | ICD-10-CM

## 2021-05-24 DIAGNOSIS — D2272 Melanocytic nevi of left lower limb, including hip: Secondary | ICD-10-CM | POA: Diagnosis not present

## 2021-05-24 DIAGNOSIS — Z1283 Encounter for screening for malignant neoplasm of skin: Secondary | ICD-10-CM | POA: Diagnosis not present

## 2021-05-24 DIAGNOSIS — D485 Neoplasm of uncertain behavior of skin: Secondary | ICD-10-CM | POA: Diagnosis not present

## 2021-05-24 DIAGNOSIS — L820 Inflamed seborrheic keratosis: Secondary | ICD-10-CM | POA: Diagnosis not present

## 2021-05-24 LAB — CUP PACEART REMOTE DEVICE CHECK
Battery Remaining Longevity: 120 mo
Battery Remaining Percentage: 100 %
Brady Statistic RA Percent Paced: 74 %
Brady Statistic RV Percent Paced: 2 %
Date Time Interrogation Session: 20221212025100
Implantable Lead Implant Date: 20191203
Implantable Lead Implant Date: 20191203
Implantable Lead Location: 753859
Implantable Lead Location: 753860
Implantable Lead Model: 7740
Implantable Lead Model: 7741
Implantable Lead Serial Number: 1095486
Implantable Lead Serial Number: 711800
Implantable Pulse Generator Implant Date: 20191203
Lead Channel Impedance Value: 589 Ohm
Lead Channel Impedance Value: 652 Ohm
Lead Channel Pacing Threshold Amplitude: 0.4 V
Lead Channel Pacing Threshold Amplitude: 1.1 V
Lead Channel Pacing Threshold Pulse Width: 0.4 ms
Lead Channel Pacing Threshold Pulse Width: 0.4 ms
Lead Channel Setting Pacing Amplitude: 3.5 V
Lead Channel Setting Pacing Amplitude: 3.5 V
Lead Channel Setting Pacing Pulse Width: 0.4 ms
Lead Channel Setting Sensing Sensitivity: 2.5 mV
Pulse Gen Serial Number: 838922

## 2021-06-02 DIAGNOSIS — L98499 Non-pressure chronic ulcer of skin of other sites with unspecified severity: Secondary | ICD-10-CM | POA: Diagnosis not present

## 2021-06-02 DIAGNOSIS — D485 Neoplasm of uncertain behavior of skin: Secondary | ICD-10-CM | POA: Diagnosis not present

## 2021-06-02 NOTE — Progress Notes (Signed)
Remote pacemaker transmission.   

## 2021-07-03 ENCOUNTER — Encounter: Payer: Self-pay | Admitting: Family Medicine

## 2021-07-06 DIAGNOSIS — M60000 Infective myositis, unspecified right arm: Secondary | ICD-10-CM | POA: Diagnosis not present

## 2021-07-13 ENCOUNTER — Ambulatory Visit (INDEPENDENT_AMBULATORY_CARE_PROVIDER_SITE_OTHER): Payer: Medicare HMO | Admitting: Family Medicine

## 2021-07-13 ENCOUNTER — Encounter: Payer: Self-pay | Admitting: Family Medicine

## 2021-07-13 VITALS — BP 108/62 | HR 69 | Temp 98.4°F | Ht 68.0 in | Wt 197.5 lb

## 2021-07-13 DIAGNOSIS — S29011A Strain of muscle and tendon of front wall of thorax, initial encounter: Secondary | ICD-10-CM

## 2021-07-13 DIAGNOSIS — M546 Pain in thoracic spine: Secondary | ICD-10-CM | POA: Diagnosis not present

## 2021-07-13 MED ORDER — TIZANIDINE HCL 4 MG PO TABS: 4.0000 mg | ORAL_TABLET | Freq: Four times a day (QID) | ORAL | 0 refills | Status: DC | PRN

## 2021-07-13 MED ORDER — MELOXICAM 7.5 MG PO TABS: 7.5000 mg | ORAL_TABLET | Freq: Every day | ORAL | 0 refills | Status: DC

## 2021-07-13 MED ORDER — ALBUTEROL SULFATE HFA 108 (90 BASE) MCG/ACT IN AERS: 2.0000 | INHALATION_SPRAY | Freq: Four times a day (QID) | RESPIRATORY_TRACT | 2 refills | Status: DC | PRN

## 2021-07-13 NOTE — Patient Instructions (Signed)
Heat (pad or rice pillow in microwave) over affected area, 10-15 minutes twice daily.   Ice/cold pack over area for 10-15 min twice daily.  OK to take Tylenol 1000 mg (2 extra strength tabs) or 975 mg (3 regular strength tabs) every 6 hours as needed.  Let us know if you need anything.   Mid-Back Strain Rehab It is normal to feel mild stretching, pulling, tightness, or discomfort as you do these exercises, but you should stop right away if you feel sudden pain or your pain gets worse.   Stretching and range of motion exercises This exercise warms up your muscles and joints and improves the movement and flexibility of your back and shoulders. This exercise also help to relieve pain. Exercise A: Chest and spine stretch    Lie down on your back on a firm surface. Roll a towel or a small blanket so it is about 4 inches (10 cm) in diameter. Put the towel lengthwise under the middle of your back so it is under your spine, but not under your shoulder blades. To increase the stretch, you may put your hands behind your head and let your elbows fall to your sides. Hold for 30 seconds. Repeat exercise 2 times. Complete this exercise 3 times per week.  Strengthening exercises These exercises build strength and endurance in your back and your shoulder blade muscles. Endurance is the ability to use your muscles for a long time, even after they get tired. Exercise B: Alternating arm and leg raises    Get on your hands and knees on a firm surface. If you are on a hard floor, you may want to use padding to cushion your knees, such as an exercise mat. Line up your arms and legs. Your hands should be below your shoulders, and your knees should be below your hips. Lift your left leg behind you. At the same time, raise your right arm and straighten it in front of you. Do not lift your leg higher than your hip. Do not lift your arm higher than your shoulder. Keep your abdominal and back muscles  tight. Keep your hips facing the ground. Do not arch your back. Keep your balance carefully, and do not hold your breath. Hold for 3 seconds. Slowly return to the starting position and repeat with your right leg and your left arm. Repeat 2 times. Complete this exercise 3 times per week. Exercise C: Straight arm rows (shoulder extension)     Stand with your feet shoulder width apart. Secure an exercise band to a stable object in front of you so the band is at or above shoulder height. Hold one end of the exercise band in each hand. Straighten your elbows and lift your hands up to shoulder height. Step back, away from the secured end of the exercise band, until the band stretches. Squeeze your shoulder blades together and pull your hands down to the sides of your thighs. Stop when your hands are straight down by your sides. Do not let your hands go behind your body. Hold for 3 seconds. Slowly return to the starting position. Repeat 2 times. Complete this exercise 3 times per week. Exercise D: Shoulder external rotation, prone Lie on your abdomen on a firm bed so your left / right forearm hangs over the edge of the bed and your upper arm is on the bed, straight out from your body. Your elbow should be bent. Your palm should be facing your feet. If instructed, hold a  5 lb weight in your hand. Squeeze your shoulder blade toward the middle of your back. Do not let your shoulder lift toward your ear. Keep your elbow bent in an "L" shape (90 degrees) while you slowly move your forearm up toward the ceiling. Move your forearm up to the height of the bed, toward your head. Your upper arm should not move. At the top of the movement, your palm should face the floor. Hold for 3 seconds. Slowly return to the starting position and relax your muscles. Repeat 3 times. Complete this exercise 3 times per week. Exercise E: Scapular retraction and external rotation, rowing    Sit in a stable chair  without armrests, or stand. Secure an exercise band to a stable object in front of you so it is at shoulder height. Hold one end of the exercise band in each hand. Bring your arms out straight in front of you. Step back, away from the secured end of the exercise band, until the band stretches. Pull the band backward. As you do this, bend your elbows and squeeze your shoulder blades together, but avoid letting the rest of your body move. Do not let your shoulders lift up toward your ears. Stop when your elbows are at your sides or slightly behind your body. Hold for 5 seconds. Slowly straighten your arms to return to the starting position. Repeat 2 times. Complete this exercise 3 times per week. Posture and body mechanics    Body mechanics refers to the movements and positions of your body while you do your daily activities. Posture is part of body mechanics. Good posture and healthy body mechanics can help to relieve stress in your body's tissues and joints. Good posture means that your spine is in its natural S-curve position (your spine is neutral), your shoulders are pulled back slightly, and your head is not tipped forward. The following are general guidelines for applying improved posture and body mechanics to your everyday activities. Standing    When standing, keep your spine neutral and your feet about hip-width apart. Keep a slight bend in your knees. Your ears, shoulders, and hips should line up. When you do a task in which you lean forward while standing in one place for a long time, place one foot up on a stable object that is 2-4 inches (5-10 cm) high, such as a footstool. This helps keep your spine neutral. Sitting    When sitting, keep your spine neutral and keep your feet flat on the floor. Use a footrest, if necessary, and keep your thighs parallel to the floor. Avoid rounding your shoulders, and avoid tilting your head forward. When working at a desk or a computer, keep your  desk at a height where your hands are slightly lower than your elbows. Slide your chair under your desk so you are close enough to maintain good posture. When working at a computer, place your monitor at a height where you are looking straight ahead and you do not have to tilt your head forward or downward to look at the screen. Resting    When lying down and resting, avoid positions that are most painful for you. If you have pain with activities such as sitting, bending, stooping, or squatting (flexion-based activities), lie in a position in which your body does not bend very much. For example, avoid curling up on your side with your arms and knees near your chest (fetal position). If you have pain with activities such as standing for  a long time or reaching with your arms (extension-based activities), lie with your spine in a neutral position and bend your knees slightly. Try the following positions: Lying on your side with a pillow between your knees. Lying on your back with a pillow under your knees.   Lifting    When lifting objects, keep your feet at least shoulder-width apart and tighten your abdominal muscles. Bend your knees and hips and keep your spine neutral. It is important to lift using the strength of your legs, not your back. Do not lock your knees straight out. Always ask for help to lift heavy or awkward objects. Make sure you discuss any questions you have with your health care provider.  Pectoralis Major Rehab Ask your health care provider which exercises are safe for you. Do exercises exactly as told by your health care provider and adjust them as directed. It is normal to feel mild stretching, pulling, tightness, or discomfort as you do these exercises, but you should stop right away if you feel sudden pain or your pain gets worse. Do not begin these exercises until told by your health care provider. Stretching and range of motion exercises These exercises warm up your  muscles and joints and improve the movement and flexibility of your shoulder. These exercises can also help to relieve pain, numbness, and tingling. Exercise A: Pendulum  Stand near a wall or a surface that you can hold onto for balance. Bend at the waist and let your left / right arm hang straight down. Use your other arm to keep your balance. Relax your arm and shoulder muscles, and move your hips and your trunk so your left / right arm swings freely. Your arm should swing because of the motion of your body, not because you are using your arm or shoulder muscles. Keep moving so your arm swings in the following directions, as told by your health care provider: Side to side. Forward and backward. In clockwise and counterclockwise circles. Slowly return to the starting position. Repeat 2 times. Complete this exercise 3 times per week. Exercise B: Abduction, standing Stand and hold a broomstick, a cane, or a similar object. Place your hands a little more than shoulder-width apart on the object. Your left / right hand should be palm-up, and your other hand should be palm-down. While keeping your elbow straight and your shoulder muscles relaxed, push the stick across your body toward your left / right side. Raise your left / right arm to the side of your body and then over your head until you feel a stretch in your shoulder. Stop when you reach the angle that is recommended by your health care provider. Avoid shrugging your shoulder while you raise your arm. Keep your shoulder blade tucked down toward the middle of your spine. Hold for 10 seconds. Slowly return to the starting position. Repeat 2 times. Complete this exercise 3 times per week. Exercise C: Wand flexion, supine  Lie on your back. You may bend your knees for comfort. Hold a broomstick, a cane, or a similar object so that your hands are about shoulder-width apart on the object. Your palms should face toward your feet. Raise your left  / right arm in front of your face, then behind your head (toward the floor). Use your other hand to help you do this. Stop when you feel a gentle stretch in your shoulder, or when you reach the angle that is recommended by your health care provider. Hold for 3  seconds. Use the broomstick and your other arm to help you return your left / right arm to the starting position. Repeat 2 times. Complete this exercise 3 times per week. Exercise D: Wand shoulder external rotation Stand and hold a broomstick, a cane, or a similar object so your hands are about shoulder-width apart on the object. Start with your arms hanging down, then bend both elbows to an "L" shape (90 degrees). Keep your left / right elbow at your side. Use your other hand to push the stick so your left / right forearm moves away from your body, out to your side. Keep your left / right elbow bent to 90 degrees and keep it against your side. Stop when you feel a gentle stretch in your shoulder, or when you reach the angle recommended by your health care provider. Hold for 10 seconds. Use the stick to help you return your left / right arm to the starting position. Repeat 2 times. Complete this exercise 3 times per week. Strengthening exercises These exercises build strength and endurance in your shoulder. Endurance is the ability to use your muscles for a long time, even after your muscles get tired. Exercise E: Scapular protraction, standing Stand so you are facing a wall. Place your feet about one arm-length away from the wall. Place your hands on the wall and straighten your elbows. Keep your hands on the wall as you push your upper back away from the wall. You should feel your shoulder blades sliding forward. Keep your elbows and your head still. If you are not sure that you are doing this exercise correctly, ask your health care provider for more instructions. Hold for 3 seconds. Slowly return to the starting position. Let your  muscles relax completely before you repeat this exercise. Repeat 2 times. Complete this exercise 3 times per week. Exercise F: Shoulder blade squeezes  (scapular retraction) Sit with good posture in a stable chair. Do not let your back touch the back of the chair. Your arms should be at your sides with your elbows bent. You may rest your forearms on a pillow if that is more comfortable. Squeeze your shoulder blades together. Bring them down and back. Keep your shoulders level. Do not lift your shoulders up toward your ears. Hold for 3 seconds. Return to the starting position. Repeat 2 times. Complete this exercise 3 times per week. This information is not intended to replace advice given to you by your health care provider. Make sure you discuss any questions you have with your health care provider. Document Released: 05/30/2005 Document Revised: 03/10/2016 Document Reviewed: 02/15/2015 Elsevier Interactive Patient Education  [employer].

## 2021-07-13 NOTE — Progress Notes (Signed)
Musculoskeletal Exam  Patient: Daniel Reyes DOB: 1946-04-19  DOS: 07/13/2021  SUBJECTIVE:  Chief Complaint:   Chief Complaint  Patient presents with   Shoulder Pain    Right     Daniel Reyes is a 76 y.o.  male for evaluation and treatment of R upper chest/shoulder pain.   Onset:  2 weeks ago. No inj or change in activity.  Location: R trap area, R upper chest Character:  aching, burning, and sharp  Progression of issue:  has slightly improved Associated symptoms: radiates to chest, pain with movement of arm Not exertional, standing made it worse.  E[place] and CXR on cruise ship.  Denies redness, bruising, swelling, decreased ROM. Treatment: to date has been OTC NSAIDS, acetaminophen, and muscle relaxers.   Neurovascular symptoms: no  Past Medical History:  Diagnosis Date   Dyspnea    GERD (gastroesophageal reflux disease)    History of chicken pox    Hyperlipemia    Mild CAD    a. cath 02/09/17 - Mild to moderate proximal and mid LAD plaque with less than 40% narrowing   Nocturnal hypoxia    Presence of permanent cardiac pacemaker 2019   approximately 2 years ago    Objective: VITAL SIGNS: BP 108/62    Pulse 69    Temp 98.4 F (36.9 C) (Oral)    Ht 5\' 8"  (1.727 m)    Wt 197 lb 8 oz (89.6 kg)    SpO2 99%    BMI 30.03 kg/m  Constitutional: Well formed, well developed. No acute distress. Thorax & Lungs: No accessory muscle use Musculoskeletal: R shoulder.   Normal active range of motion: yes.   Normal passive range of motion: yes Tenderness to palpation: yes over mid clav line of upper R pec and cephalad portion of rhomboid; no ttp over trap Deformity: no Ecchymosis: no Tests positive: none Tests negative: Spurling's Neurologic: Normal sensory function. No focal deficits noted. DTR's equal and symmetric in UE's. No clonus. Psychiatric: Normal mood. Age appropriate judgment and insight. Alert & oriented x 3.    Assessment:  Pectoralis muscle strain, initial  encounter - Plan: meloxicam (MOBIC) 7.5 MG tablet, tiZANidine (ZANAFLEX) 4 MG tablet  Acute right-sided thoracic back pain - Plan: meloxicam (MOBIC) 7.5 MG tablet, tiZANidine (ZANAFLEX) 4 MG tablet  Plan: Stretches/exercises, heat, ice, Tylenol. PT if no better in next 3-4 weeks.  F/u prn. The patient voiced understanding and agreement to the plan.   [place], DO 07/13/21  9:51 AM

## 2021-08-04 DIAGNOSIS — D352 Benign neoplasm of pituitary gland: Secondary | ICD-10-CM | POA: Diagnosis not present

## 2021-08-04 DIAGNOSIS — H35372 Puckering of macula, left eye: Secondary | ICD-10-CM | POA: Diagnosis not present

## 2021-08-04 DIAGNOSIS — H43813 Vitreous degeneration, bilateral: Secondary | ICD-10-CM | POA: Diagnosis not present

## 2021-08-04 DIAGNOSIS — H2513 Age-related nuclear cataract, bilateral: Secondary | ICD-10-CM | POA: Diagnosis not present

## 2021-08-09 ENCOUNTER — Other Ambulatory Visit: Payer: Self-pay | Admitting: Family Medicine

## 2021-08-09 DIAGNOSIS — S29011A Strain of muscle and tendon of front wall of thorax, initial encounter: Secondary | ICD-10-CM

## 2021-08-09 DIAGNOSIS — M546 Pain in thoracic spine: Secondary | ICD-10-CM

## 2021-08-23 ENCOUNTER — Ambulatory Visit (INDEPENDENT_AMBULATORY_CARE_PROVIDER_SITE_OTHER): Payer: Medicare HMO

## 2021-08-23 DIAGNOSIS — I495 Sick sinus syndrome: Secondary | ICD-10-CM | POA: Diagnosis not present

## 2021-08-23 LAB — CUP PACEART REMOTE DEVICE CHECK
Battery Remaining Longevity: 120 mo
Battery Remaining Percentage: 100 %
Brady Statistic RA Percent Paced: 75 %
Brady Statistic RV Percent Paced: 2 %
Date Time Interrogation Session: 20230313025000
Implantable Lead Implant Date: 20191203
Implantable Lead Implant Date: 20191203
Implantable Lead Location: 753859
Implantable Lead Location: 753860
Implantable Lead Model: 7740
Implantable Lead Model: 7741
Implantable Lead Serial Number: 1095486
Implantable Lead Serial Number: 711800
Implantable Pulse Generator Implant Date: 20191203
Lead Channel Impedance Value: 609 Ohm
Lead Channel Impedance Value: 659 Ohm
Lead Channel Pacing Threshold Amplitude: 0.4 V
Lead Channel Pacing Threshold Amplitude: 1 V
Lead Channel Pacing Threshold Pulse Width: 0.4 ms
Lead Channel Pacing Threshold Pulse Width: 0.4 ms
Lead Channel Setting Pacing Amplitude: 3.5 V
Lead Channel Setting Pacing Amplitude: 3.5 V
Lead Channel Setting Pacing Pulse Width: 0.4 ms
Lead Channel Setting Sensing Sensitivity: 2.5 mV
Pulse Gen Serial Number: 838922

## 2021-08-26 DIAGNOSIS — L570 Actinic keratosis: Secondary | ICD-10-CM | POA: Diagnosis not present

## 2021-08-26 DIAGNOSIS — X32XXXD Exposure to sunlight, subsequent encounter: Secondary | ICD-10-CM | POA: Diagnosis not present

## 2021-08-26 DIAGNOSIS — Z1283 Encounter for screening for malignant neoplasm of skin: Secondary | ICD-10-CM | POA: Diagnosis not present

## 2021-08-26 DIAGNOSIS — D225 Melanocytic nevi of trunk: Secondary | ICD-10-CM | POA: Diagnosis not present

## 2021-09-03 NOTE — Progress Notes (Signed)
Remote pacemaker transmission.   

## 2021-09-20 ENCOUNTER — Encounter: Payer: Medicare HMO | Admitting: Family Medicine

## 2021-09-23 ENCOUNTER — Encounter: Payer: Medicare HMO | Admitting: Family Medicine

## 2021-10-21 ENCOUNTER — Other Ambulatory Visit (HOSPITAL_COMMUNITY): Payer: Self-pay | Admitting: Neurosurgery

## 2021-10-21 ENCOUNTER — Other Ambulatory Visit: Payer: Self-pay | Admitting: Neurosurgery

## 2021-10-21 DIAGNOSIS — D497 Neoplasm of unspecified behavior of endocrine glands and other parts of nervous system: Secondary | ICD-10-CM

## 2021-10-29 ENCOUNTER — Ambulatory Visit: Payer: Medicare HMO

## 2021-11-22 ENCOUNTER — Ambulatory Visit (INDEPENDENT_AMBULATORY_CARE_PROVIDER_SITE_OTHER): Payer: Medicare HMO

## 2021-11-22 DIAGNOSIS — I495 Sick sinus syndrome: Secondary | ICD-10-CM

## 2021-11-23 LAB — CUP PACEART REMOTE DEVICE CHECK
Battery Remaining Longevity: 114 mo
Battery Remaining Percentage: 100 %
Brady Statistic RA Percent Paced: 75 %
Brady Statistic RV Percent Paced: 2 %
Date Time Interrogation Session: 20230612025300
Implantable Lead Implant Date: 20191203
Implantable Lead Implant Date: 20191203
Implantable Lead Location: 753859
Implantable Lead Location: 753860
Implantable Lead Model: 7740
Implantable Lead Model: 7741
Implantable Lead Serial Number: 1095486
Implantable Lead Serial Number: 711800
Implantable Pulse Generator Implant Date: 20191203
Lead Channel Impedance Value: 573 Ohm
Lead Channel Impedance Value: 642 Ohm
Lead Channel Pacing Threshold Amplitude: 0.4 V
Lead Channel Pacing Threshold Amplitude: 1.2 V
Lead Channel Pacing Threshold Pulse Width: 0.4 ms
Lead Channel Pacing Threshold Pulse Width: 0.4 ms
Lead Channel Setting Pacing Amplitude: 3.5 V
Lead Channel Setting Pacing Amplitude: 3.5 V
Lead Channel Setting Pacing Pulse Width: 0.4 ms
Lead Channel Setting Sensing Sensitivity: 2.5 mV
Pulse Gen Serial Number: 838922

## 2021-12-09 ENCOUNTER — Ambulatory Visit (HOSPITAL_COMMUNITY)
Admission: RE | Admit: 2021-12-09 | Discharge: 2021-12-09 | Disposition: A | Discharge status: Home / Self Care | Payer: Medicare HMO | Source: Ambulatory Visit | Attending: Neurosurgery | Admitting: Neurosurgery

## 2021-12-09 DIAGNOSIS — D497 Neoplasm of unspecified behavior of endocrine glands and other parts of nervous system: Secondary | ICD-10-CM | POA: Diagnosis not present

## 2021-12-09 DIAGNOSIS — I619 Nontraumatic intracerebral hemorrhage, unspecified: Secondary | ICD-10-CM | POA: Diagnosis not present

## 2021-12-09 DIAGNOSIS — D352 Benign neoplasm of pituitary gland: Secondary | ICD-10-CM | POA: Diagnosis not present

## 2021-12-09 MED ORDER — GADOBUTROL 1 MMOL/ML IV SOLN
8.0000 mL | Freq: Once | INTRAVENOUS | Status: AC | PRN
  Administered 2021-12-09: 8 mL via INTRAVENOUS

## 2021-12-09 NOTE — Progress Notes (Signed)
Per order, "Interrogation performed by [employer]. Programmed to DOO 75 for procedure.   Tachy-therapies to off if applicable.   Program device back to pre-MRI settings after completion of exam."  Programmed device with [employer] rep, Joey, to DOO 75. Post MRI device set back to regular settings. No issues during scan.

## 2021-12-09 NOTE — Progress Notes (Signed)
IVT consulted for difficult PIV placement.  Upon arrival, access was already obtained.

## 2021-12-09 NOTE — Progress Notes (Signed)
Informed of MRI for today.   Device system confirmed to be MRI conditional, with implant date > 6 weeks ago, and no evidence of abandoned or epicardial leads in review of most recent CXR Interrogation performed by [employer]. Programmed to DOO 75 for procedure.   Tachy-therapies to off if applicable.  Program device back to pre-MRI settings after completion of exam.  Shirley Friar, PA-C  12/09/2021 1:12 PM

## 2021-12-10 NOTE — Progress Notes (Signed)
Remote pacemaker transmission.   

## 2021-12-13 DIAGNOSIS — Z6828 Body mass index (BMI) 28.0-28.9, adult: Secondary | ICD-10-CM | POA: Diagnosis not present

## 2021-12-13 DIAGNOSIS — M5417 Radiculopathy, lumbosacral region: Secondary | ICD-10-CM | POA: Diagnosis not present

## 2022-02-03 ENCOUNTER — Encounter: Payer: Self-pay | Admitting: *Deleted

## 2022-02-03 ENCOUNTER — Ambulatory Visit: Payer: Self-pay | Admitting: *Deleted

## 2022-02-03 NOTE — Patient Outreach (Signed)
  Care Coordination   02/03/2022 Name: Daniel Reyes MRN: [id number] DOB: 03-Sep-1945   Care Coordination Outreach Attempts:  An unsuccessful telephone outreach was attempted today to offer the patient information about available care coordination services as a benefit of their health plan.   Follow Up Plan:  Additional outreach attempts will be made to offer the patient care coordination information and services.   Encounter Outcome:  No Answer left voice message requesting call back  Care Coordination Interventions Activated:  No   Care Coordination Interventions:  No, not indicated unsuccessful outreach attempt    Oneta Rack, RN, BSN, CCRN Alumnus RN [facility] [phone]: direct office

## 2022-02-07 ENCOUNTER — Ambulatory Visit: Payer: Self-pay | Admitting: *Deleted

## 2022-02-07 ENCOUNTER — Encounter: Payer: Self-pay | Admitting: *Deleted

## 2022-02-07 NOTE — Patient Outreach (Signed)
  Care Coordination   02/07/2022 Name: Daniel Reyes MRN: [id number] DOB: 1945/08/11   Care Coordination Outreach Attempts:  A second unsuccessful outreach was attempted today to offer the patient with information about available care coordination services as a benefit of their health plan.     Follow Up Plan:  Additional outreach attempts will be made to offer the patient care coordination information and services.   Encounter Outcome:  No Answer left voice message requesting call back  Care Coordination Interventions Activated:  No   Care Coordination Interventions:  No, not indicated unsuccessful attempt   Oneta Rack, RN, BSN, CCRN Alumnus RN CM Care Coordination/ [facility] [phone]: direct office

## 2022-02-11 ENCOUNTER — Encounter: Payer: Self-pay | Admitting: *Deleted

## 2022-02-11 ENCOUNTER — Ambulatory Visit: Payer: Self-pay | Admitting: *Deleted

## 2022-02-11 NOTE — Patient Outreach (Signed)
  Care Coordination   02/11/2022 Name: Daniel Reyes MRN: [id number] DOB: March 15, 1946   Care Coordination Outreach Attempts:  A third unsuccessful outreach was attempted today to offer the patient with information about available care coordination services as a benefit of their health plan.   Follow Up Plan:  No further outreach attempts will be made at this time. We have been unable to contact the patient to offer or enroll patient in care coordination services  Encounter Outcome:  No Answer left voice message requesting call back  Care Coordination Interventions Activated:  No   Care Coordination Interventions:  No, not indicated unsuccessful consecutive outreach attempt # [place], RN, BSN, CCRN Alumnus RN CM Care Coordination/ [facility] [phone]: direct office

## 2022-02-18 ENCOUNTER — Telehealth: Payer: Self-pay | Admitting: Family Medicine

## 2022-02-18 NOTE — Telephone Encounter (Signed)
Left message for patient to call back and schedule Medicare Annual Wellness Visit (AWV).   Please offer to do virtually or by telephone.  Left office number and my jabber [phone].  Last AWV:06/01/2018  Please schedule at anytime with Nurse Health Advisor.

## 2022-02-21 ENCOUNTER — Ambulatory Visit (INDEPENDENT_AMBULATORY_CARE_PROVIDER_SITE_OTHER): Payer: Medicare HMO

## 2022-02-21 DIAGNOSIS — I495 Sick sinus syndrome: Secondary | ICD-10-CM | POA: Diagnosis not present

## 2022-02-23 LAB — CUP PACEART REMOTE DEVICE CHECK
Battery Remaining Longevity: 120 mo
Battery Remaining Percentage: 100 %
Brady Statistic RA Percent Paced: 75 %
Brady Statistic RV Percent Paced: 2 %
Date Time Interrogation Session: 20230911202500
Implantable Lead Implant Date: 20191203
Implantable Lead Implant Date: 20191203
Implantable Lead Location: 753859
Implantable Lead Location: 753860
Implantable Lead Model: 7740
Implantable Lead Model: 7741
Implantable Lead Serial Number: 1095486
Implantable Lead Serial Number: 711800
Implantable Pulse Generator Implant Date: 20191203
Lead Channel Impedance Value: 566 Ohm
Lead Channel Impedance Value: 665 Ohm
Lead Channel Pacing Threshold Amplitude: 0.4 V
Lead Channel Pacing Threshold Amplitude: 1.4 V
Lead Channel Pacing Threshold Pulse Width: 0.4 ms
Lead Channel Pacing Threshold Pulse Width: 0.4 ms
Lead Channel Setting Pacing Amplitude: 3.5 V
Lead Channel Setting Pacing Amplitude: 3.5 V
Lead Channel Setting Pacing Pulse Width: 0.4 ms
Lead Channel Setting Sensing Sensitivity: 2.5 mV
Pulse Gen Serial Number: 838922

## 2022-03-07 DIAGNOSIS — B338 Other specified viral diseases: Secondary | ICD-10-CM | POA: Diagnosis not present

## 2022-03-10 NOTE — Progress Notes (Signed)
Remote pacemaker transmission.   

## 2022-05-06 DIAGNOSIS — J069 Acute upper respiratory infection, unspecified: Secondary | ICD-10-CM | POA: Diagnosis not present

## 2022-05-30 ENCOUNTER — Ambulatory Visit (INDEPENDENT_AMBULATORY_CARE_PROVIDER_SITE_OTHER): Payer: Medicare HMO

## 2022-05-30 DIAGNOSIS — I495 Sick sinus syndrome: Secondary | ICD-10-CM | POA: Diagnosis not present

## 2022-05-31 LAB — CUP PACEART REMOTE DEVICE CHECK
Battery Remaining Longevity: 108 mo
Battery Remaining Percentage: 100 %
Brady Statistic RA Percent Paced: 75 %
Brady Statistic RV Percent Paced: 2 %
Date Time Interrogation Session: 20231215221400
Implantable Lead Connection Status: 753985
Implantable Lead Connection Status: 753985
Implantable Lead Implant Date: 20191203
Implantable Lead Implant Date: 20191203
Implantable Lead Location: 753859
Implantable Lead Location: 753860
Implantable Lead Model: 7740
Implantable Lead Model: 7741
Implantable Lead Serial Number: 1095486
Implantable Lead Serial Number: 711800
Implantable Pulse Generator Implant Date: 20191203
Lead Channel Impedance Value: 587 Ohm
Lead Channel Impedance Value: 652 Ohm
Lead Channel Pacing Threshold Amplitude: 0.3 V
Lead Channel Pacing Threshold Amplitude: 1 V
Lead Channel Pacing Threshold Pulse Width: 0.4 ms
Lead Channel Pacing Threshold Pulse Width: 0.4 ms
Lead Channel Setting Pacing Amplitude: 3.5 V
Lead Channel Setting Pacing Amplitude: 3.5 V
Lead Channel Setting Pacing Pulse Width: 0.4 ms
Lead Channel Setting Sensing Sensitivity: 2.5 mV
Pulse Gen Serial Number: 838922
Zone Setting Status: 755011

## 2022-07-05 ENCOUNTER — Encounter: Payer: Self-pay | Admitting: Family Medicine

## 2022-07-05 ENCOUNTER — Ambulatory Visit (INDEPENDENT_AMBULATORY_CARE_PROVIDER_SITE_OTHER): Payer: Medicare HMO | Admitting: Family Medicine

## 2022-07-05 VITALS — BP 108/74 | HR 58 | Temp 97.7°F | Resp 12 | Ht 68.0 in | Wt 197.8 lb

## 2022-07-05 DIAGNOSIS — J0140 Acute pansinusitis, unspecified: Secondary | ICD-10-CM | POA: Diagnosis not present

## 2022-07-05 DIAGNOSIS — J4520 Mild intermittent asthma, uncomplicated: Secondary | ICD-10-CM

## 2022-07-05 MED ORDER — AMOXICILLIN-POT CLAVULANATE 875-125 MG PO TABS: 1.0000 | ORAL_TABLET | Freq: Two times a day (BID) | ORAL | 0 refills | Status: DC

## 2022-07-05 MED ORDER — GUAIFENESIN ER 600 MG PO TB12: 1200.0000 mg | ORAL_TABLET | Freq: Two times a day (BID) | ORAL | 0 refills | Status: DC

## 2022-07-05 MED ORDER — FLUTICASONE PROPIONATE 50 MCG/ACT NA SUSP: 2.0000 | Freq: Every day | NASAL | 6 refills | Status: DC

## 2022-07-05 MED ORDER — ALBUTEROL SULFATE HFA 108 (90 BASE) MCG/ACT IN AERS: 2.0000 | INHALATION_SPRAY | Freq: Four times a day (QID) | RESPIRATORY_TRACT | 2 refills | Status: DC | PRN

## 2022-07-05 NOTE — Progress Notes (Signed)
Remote pacemaker transmission.   

## 2022-07-05 NOTE — Patient Instructions (Signed)
Start antibiotics - Augmentin. Recommend adding twice daily Mucinex and once a day Flonase Continue supportive measures including rest, hydration, humidifier use, steam showers, warm compresses to sinuses, warm liquids with lemon and honey, and over-the-counter cough, cold, and analgesics as needed.   Please contact office for follow-up if symptoms do not improve or worsen. Seek emergency care if symptoms become severe.

## 2022-07-05 NOTE — Progress Notes (Signed)
Acute Office Visit  Subjective:     Patient ID: Daniel Reyes, male    DOB: [birth year], 77 y.o.   MRN: [id number]  Chief Complaint  Patient presents with   head congrestion    HPI Patient is in today for head congestion and sinus pressure.   Patient states he got sick on a cruise the last week of December (diarrhea and URI symptoms). Most symptoms resolved, but he has had persistent nasal congestion, sinus pressure (mostly left maxillary), headaches, minimal coughing and sneezing for the past 2-3 weeks. He tried Mucinex for awhile with minimal improvement. He denies recent fevers, chills, nausea, vomiting, diarrhea, body aches, ear pain. No recent antibiotic use.      ROS All review of systems negative except what is listed in the HPI      Objective:    BP 108/74 (BP Location: Left Arm, Cuff Size: Normal)   Pulse (!) 58   Temp 97.7 F (36.5 C) (Oral)   Resp 12   Ht '5\' 8"'$  (1.727 m)   Wt 197 lb 12.8 oz (89.7 kg)   SpO2 99%   BMI 30.08 kg/m    Physical Exam Vitals reviewed.  Constitutional:      Appearance: Normal appearance.  HENT:     Head: Normocephalic and atraumatic.     Comments: Tenderness to left maxillary sinus percussion    Right Ear: Tympanic membrane normal.     Left Ear: Tympanic membrane normal.     Nose: Congestion present.     Mouth/Throat:     Mouth: Mucous membranes are moist.     Pharynx: Oropharynx is clear. No oropharyngeal exudate or posterior oropharyngeal erythema.  Eyes:     Conjunctiva/sclera: Conjunctivae normal.  Cardiovascular:     Rate and Rhythm: Normal rate and regular rhythm.     Pulses: Normal pulses.     Heart sounds: Normal heart sounds.  Pulmonary:     Effort: Pulmonary effort is normal.     Breath sounds: Normal breath sounds.  Musculoskeletal:     Cervical back: Normal range of motion and neck supple. No tenderness.  Lymphadenopathy:     Cervical: No cervical adenopathy.  Skin:    General: Skin is warm and dry.   Neurological:     Mental Status: He is alert and oriented to person, place, and time.  Psychiatric:        Mood and Affect: Mood normal.        Behavior: Behavior normal.        Thought Content: Thought content normal.        Judgment: Judgment normal.     No results found for any visits on 07/05/22.      Assessment & Plan:   Problem List Items Addressed This Visit       Respiratory   Asthma No acute concerns. Refill provided.    Relevant Medications   albuterol (VENTOLIN HFA) 108 (90 Base) MCG/ACT inhaler   Other Visit Diagnoses     Acute non-recurrent pansinusitis    -  Primary Start antibiotics - Augmentin. Recommend adding twice daily Mucinex and once a day Flonase Continue supportive measures including rest, hydration, humidifier use, steam showers, warm compresses to sinuses, warm liquids with lemon and honey, and over-the-counter cough, cold, and analgesics as needed.     Relevant Medications   amoxicillin-clavulanate (AUGMENTIN) 875-125 MG tablet   guaiFENesin (MUCINEX) 600 MG 12 hr tablet   fluticasone (FLONASE) 50 MCG/ACT nasal spray  Meds ordered this encounter  Medications   albuterol (VENTOLIN HFA) 108 (90 Base) MCG/ACT inhaler    Sig: Inhale 2 puffs into the lungs every 6 (six) hours as needed for wheezing or shortness of breath.    Dispense:  1 each    Refill:  2    Order Specific Question:   Supervising Provider    Answer:   Penni Homans A [[id number]]   amoxicillin-clavulanate (AUGMENTIN) 875-125 MG tablet    Sig: Take 1 tablet by mouth 2 (two) times daily.    Dispense:  20 tablet    Refill:  0    Order Specific Question:   Supervising Provider    Answer:   Penni Homans A [[id number]]   guaiFENesin (MUCINEX) 600 MG 12 hr tablet    Sig: Take 2 tablets (1,200 mg total) by mouth 2 (two) times daily.    Dispense:  30 tablet    Refill:  0    Order Specific Question:   Supervising Provider    Answer:   Penni Homans A [[id number]]   fluticasone (FLONASE)  50 MCG/ACT nasal spray    Sig: Place 2 sprays into both nostrils daily.    Dispense:  16 g    Refill:  6    Order Specific Question:   Supervising Provider    Answer:   Penni Homans A [[id number]]    Return if symptoms worsen or fail to improve.  Terrilyn Saver, NP

## 2022-07-25 DIAGNOSIS — R69 Illness, unspecified: Secondary | ICD-10-CM | POA: Diagnosis not present

## 2022-08-03 ENCOUNTER — Ambulatory Visit: Payer: Medicare HMO

## 2022-08-03 NOTE — Progress Notes (Signed)
Pt stated he would have to do a Friday appt due to his locations and the call may be dropped . Care guide aware to reschedule

## 2022-08-04 ENCOUNTER — Telehealth: Payer: Self-pay | Admitting: Family Medicine

## 2022-08-04 NOTE — Telephone Encounter (Signed)
Contacted REVELL LUNN to schedule their annual wellness visit. Appointment made for 08/05/2022.  [place] Direct Dial [phone]

## 2022-08-05 ENCOUNTER — Ambulatory Visit (INDEPENDENT_AMBULATORY_CARE_PROVIDER_SITE_OTHER): Payer: Medicare HMO

## 2022-08-05 VITALS — Ht 68.0 in | Wt 197.0 lb

## 2022-08-05 DIAGNOSIS — Z0000 Encounter for general adult medical examination without abnormal findings: Secondary | ICD-10-CM | POA: Diagnosis not present

## 2022-08-05 NOTE — Patient Instructions (Signed)
Daniel Reyes , Thank you for taking time to come for your Medicare Wellness Visit. I appreciate your ongoing commitment to your health goals. Please review the following plan we discussed and let me know if I can assist you in the future.   These are the goals we discussed:  Goals      Downsize  home.     Increase physical activity     Begin silver sneakers         This is a list of the screening recommended for you and due dates:  Health Maintenance  Topic Date Due   COVID-19 Vaccine (5 - 2023-24 season) 02/11/2022   Flu Shot  09/11/2022*   Medicare Annual Wellness Visit  08/06/2023   Colon Cancer Screening  04/14/2025   DTaP/Tdap/Td vaccine (2 - Tdap) 05/30/2027   Pneumonia Vaccine  Completed   Hepatitis C Screening: USPSTF Recommendation to screen - Ages 18-79 yo.  Completed   Zoster (Shingles) Vaccine  Completed   HPV Vaccine  Aged Out  *Topic was postponed. The date shown is not the original due date.    Advanced directives: Please bring a copy of your health care power of attorney and living will to the office to be added to your chart at your convenience.   Conditions/risks identified: Aim for 30 minutes of exercise or brisk walking, 6-8 glasses of water, and 5 servings of fruits and vegetables each day.   Next appointment: Follow up in one year for your annual wellness visit.   Preventive Care 27 Years and Older, Male  Preventive care refers to lifestyle choices and visits with your health care provider that can promote health and wellness. What does preventive care include? A yearly physical exam. This is also called an annual well check. Dental exams once or twice a year. Routine eye exams. Ask your health care provider how often you should have your eyes checked. Personal lifestyle choices, including: Daily care of your teeth and gums. Regular physical activity. Eating a healthy diet. Avoiding tobacco and drug use. Limiting alcohol use. Practicing safe  sex. Taking low doses of aspirin every day. Taking vitamin and mineral supplements as recommended by your health care provider. What happens during an annual well check? The services and screenings done by your health care provider during your annual well check will depend on your age, overall health, lifestyle risk factors, and family history of disease. Counseling  Your health care provider may ask you questions about your: Alcohol use. Tobacco use. Drug use. Emotional well-being. Home and relationship well-being. Sexual activity. Eating habits. History of falls. Memory and ability to understand (cognition). Work and work Statistician. Screening  You may have the following tests or measurements: Height, weight, and BMI. Blood pressure. Lipid and cholesterol levels. These may be checked every 5 years, or more frequently if you are over 20 years old. Skin check. Lung cancer screening. You may have this screening every year starting at age 43 if you have a 30-pack-year history of smoking and currently smoke or have quit within the past 15 years. Fecal occult blood test (FOBT) of the stool. You may have this test every year starting at age 83. Flexible sigmoidoscopy or colonoscopy. You may have a sigmoidoscopy every 5 years or a colonoscopy every 10 years starting at age 15. Prostate cancer screening. Recommendations will vary depending on your family history and other risks. Hepatitis C blood test. Hepatitis B blood test. Sexually transmitted disease (STD) testing. Diabetes screening. This is  done by checking your blood sugar (glucose) after you have not eaten for a while (fasting). You may have this done every 1-3 years. Abdominal aortic aneurysm (AAA) screening. You may need this if you are a current or former smoker. Osteoporosis. You may be screened starting at age 100 if you are at high risk. Talk with your health care provider about your test results, treatment options, and if  necessary, the need for more tests. Vaccines  Your health care provider may recommend certain vaccines, such as: Influenza vaccine. This is recommended every year. Tetanus, diphtheria, and acellular pertussis (Tdap, Td) vaccine. You may need a Td booster every 10 years. Zoster vaccine. You may need this after age 7. Pneumococcal 13-valent conjugate (PCV13) vaccine. One dose is recommended after age 27. Pneumococcal polysaccharide (PPSV23) vaccine. One dose is recommended after age 35. Talk to your health care provider about which screenings and vaccines you need and how often you need them. This information is not intended to replace advice given to you by your health care provider. Make sure you discuss any questions you have with your health care provider. Document Released: 06/26/2015 Document Revised: 02/17/2016 Document Reviewed: 03/31/2015 Elsevier Interactive Patient Education  2017 [place] Prevention in the Home Falls can cause injuries. They can happen to people of all ages. There are many things you can do to make your home safe and to help prevent falls. What can I do on the outside of my home? Regularly fix the edges of walkways and driveways and fix any cracks. Remove anything that might make you trip as you walk through a door, such as a raised step or threshold. Trim any bushes or trees on the path to your home. Use bright outdoor lighting. Clear any walking paths of anything that might make someone trip, such as rocks or tools. Regularly check to see if handrails are loose or broken. Make sure that both sides of any steps have handrails. Any raised decks and porches should have guardrails on the edges. Have any leaves, snow, or ice cleared regularly. Use sand or salt on walking paths during winter. Clean up any spills in your garage right away. This includes oil or grease spills. What can I do in the bathroom? Use night lights. Install grab bars by the toilet  and in the tub and shower. Do not use towel bars as grab bars. Use non-skid mats or decals in the tub or shower. If you need to sit down in the shower, use a plastic, non-slip stool. Keep the floor dry. Clean up any water that spills on the floor as soon as it happens. Remove soap buildup in the tub or shower regularly. Attach bath mats securely with double-sided non-slip rug tape. Do not have throw rugs and other things on the floor that can make you trip. What can I do in the bedroom? Use night lights. Make sure that you have a light by your bed that is easy to reach. Do not use any sheets or blankets that are too big for your bed. They should not hang down onto the floor. Have a firm chair that has side arms. You can use this for support while you get dressed. Do not have throw rugs and other things on the floor that can make you trip. What can I do in the kitchen? Clean up any spills right away. Avoid walking on wet floors. Keep items that you use a lot in easy-to-reach places. If you need  to reach something above you, use a strong step stool that has a grab bar. Keep electrical cords out of the way. Do not use floor polish or wax that makes floors slippery. If you must use wax, use non-skid floor wax. Do not have throw rugs and other things on the floor that can make you trip. What can I do with my stairs? Do not leave any items on the stairs. Make sure that there are handrails on both sides of the stairs and use them. Fix handrails that are broken or loose. Make sure that handrails are as long as the stairways. Check any carpeting to make sure that it is firmly attached to the stairs. Fix any carpet that is loose or worn. Avoid having throw rugs at the top or bottom of the stairs. If you do have throw rugs, attach them to the floor with carpet tape. Make sure that you have a light switch at the top of the stairs and the bottom of the stairs. If you do not have them, ask someone to add  them for you. What else can I do to help prevent falls? Wear shoes that: Do not have high heels. Have rubber bottoms. Are comfortable and fit you well. Are closed at the toe. Do not wear sandals. If you use a stepladder: Make sure that it is fully opened. Do not climb a closed stepladder. Make sure that both sides of the stepladder are locked into place. Ask someone to hold it for you, if possible. Clearly mark and make sure that you can see: Any grab bars or handrails. First and last steps. Where the edge of each step is. Use tools that help you move around (mobility aids) if they are needed. These include: Canes. Walkers. Scooters. Crutches. Turn on the lights when you go into a dark area. Replace any light bulbs as soon as they burn out. Set up your furniture so you have a clear path. Avoid moving your furniture around. If any of your floors are uneven, fix them. If there are any pets around you, be aware of where they are. Review your medicines with your doctor. Some medicines can make you feel dizzy. This can increase your chance of falling. Ask your doctor what other things that you can do to help prevent falls. This information is not intended to replace advice given to you by your health care provider. Make sure you discuss any questions you have with your health care provider. Document Released: 03/26/2009 Document Revised: 11/05/2015 Document Reviewed: 07/04/2014 Elsevier Interactive Patient Education  2017 [employer].

## 2022-08-05 NOTE — Progress Notes (Signed)
Subjective:   Daniel Reyes is a 77 y.o. male who presents for Medicare Annual/Subsequent preventive examination.  I connected with  Daniel Reyes on 08/05/22 by a audio enabled telemedicine application and verified that I am speaking with the correct person using two identifiers.  Patient Location: Home  Provider Location: Home Office  I discussed the limitations of evaluation and management by telemedicine. The patient expressed understanding and agreed to proceed.  Review of Systems     Cardiac Risk Factors include: advanced age (>14mn, >>103women);male gender;dyslipidemia     Objective:    Today's Vitals   08/05/22 1233  Weight: 197 lb (89.4 kg)  Height: '5\' 8"'$  (1.727 m)   Body mass index is 29.95 kg/m.     08/05/2022   12:29 PM 02/14/2020    9:00 PM 02/12/2020    8:11 AM 06/01/2018    9:21 AM 05/15/2018   10:22 AM 02/09/2017    3:00 AM 02/08/2017   10:51 AM  Advanced Directives  Does Patient Have a Medical Advance Directive? Yes Yes Yes Yes Yes No No  Type of Advance Directive Living will;Healthcare Power of [place] will    Does patient want to make changes to medical advance directive? No - Patient declined No - Patient declined  No - Patient declined No - Patient declined    Copy of HYoderin Chart? No - copy requested No - copy requested No - copy requested No - copy requested No - copy requested    Would patient like information on creating a medical advance directive?      No - Patient declined     Current Medications (verified) Outpatient Encounter Medications as of 08/05/2022  Medication Sig   fluticasone (FLONASE) 50 MCG/ACT nasal spray Place 2 sprays into both nostrils daily.   albuterol (VENTOLIN HFA) 108 (90 Base) MCG/ACT inhaler Inhale 2 puffs into the lungs every 6 (six) hours as  needed for wheezing or shortness of breath. (Patient not taking: Reported on 08/05/2022)   [DISCONTINUED] amoxicillin-clavulanate (AUGMENTIN) 875-125 MG tablet Take 1 tablet by mouth 2 (two) times daily.   [DISCONTINUED] guaiFENesin (MUCINEX) 600 MG 12 hr tablet Take 2 tablets (1,200 mg total) by mouth 2 (two) times daily.   No facility-administered encounter medications on file as of 08/05/2022.    Allergies (verified) Imdur [isosorbide dinitrate]   History: Past Medical History:  Diagnosis Date   Dyspnea    GERD (gastroesophageal reflux disease)    History of chicken pox    Hyperlipemia    Mild CAD    a. cath 02/09/17 - Mild to moderate proximal and mid LAD plaque with less than 40% narrowing   Nocturnal hypoxia    Presence of permanent cardiac pacemaker 2019   approximately 2 years ago   Past Surgical History:  Procedure Laterality Date   [place] 02/09/2017   CHOLECYSTECTOMY OPEN  1998   CRANIOTOMY N/A 02/14/2020   Procedure: Transphenoidal resection of pituitary tumor;  Surgeon: CAshok Pall MD;  [place]  Service: Neurosurgery;  Laterality: N/A;   gerd surgery     INSERT / REPLACE / REdisonIMPLANT N/A 05/15/2018   Procedure: PACEMAKER IMPLANT - daul chamber;  Surgeon: TEvans Lance MD;  [facility];  Service: Cardiovascular;  Laterality: N/A;   RIGHT/LEFT HEART CATH AND CORONARY ANGIOGRAPHY N/A 02/09/2017   Procedure: RIGHT/LEFT HEART CATH AND CORONARY ANGIOGRAPHY;  Surgeon: Belva Crome, MD;  Location: [place] CV LAB;  Service: Cardiovascular;  Laterality: N/A;   TONSILLECTOMY     TONSILLECTOMY AND ADENOIDECTOMY     TRANSPHENOIDAL APPROACH EXPOSURE N/A 02/14/2020   Procedure: TRANSPHENOIDAL APPROACH EXPOSURE;  Surgeon: Jerrell Belfast, MD;  Location: [place];  Service: ENT;  Laterality: N/A;   Family History  Problem Relation Age of Onset   Heart attack Father     Colon cancer Father    Cancer Father    Alzheimer's disease Mother    Stroke Brother    Heart disease Brother        afib   Stroke Maternal Grandmother    Mental illness Maternal Grandfather        suicide   Alzheimer's disease Paternal Grandmother    Peripheral vascular disease Paternal Grandfather    Alcohol abuse Neg Hx    Social History   Socioeconomic History   Marital status: Married    Spouse name: Not on file   Number of children: 3   Years of education: Not on file   Highest education level: Not on file  Occupational History   Occupation: retired-owner of [place]  Tobacco Use   Smoking status: Never   Smokeless tobacco: Never  Vaping Use   Vaping Use: Never used  Substance and Sexual Activity   Alcohol use: Yes    Comment: very rarely   Drug use: No   Sexual activity: Not Currently  Other Topics Concern   Not on file  Social History Narrative   Lives with wife, retired Pharmacist, community, no major dietary restrictions, negative cigarettes. Rare alcohol, wears seat belt most of the time   Social Determinants of Health   Financial Resource Strain: Low Risk  (08/05/2022)   Overall Financial Resource Strain (CARDIA)    Difficulty of Paying Living Expenses: Not hard at all  Food Insecurity: No Food Insecurity (08/05/2022)   Hunger Vital Sign    Worried About Running Out of Food in the Last Year: Never true    Ran Out of Food in the Last Year: Never true  Transportation Needs: No Transportation Needs (08/05/2022)   PRAPARE - Hydrologist (Medical): No    Lack of Transportation (Non-Medical): No  Physical Activity: Sufficiently Active (08/05/2022)   Exercise Vital Sign    Days of Exercise per Week: 5 days    Minutes of Exercise per Session: 30 min  Stress: No Stress Concern Present (08/05/2022)   [place]    Feeling of Stress : Not at all   Social Connections: Moderately Integrated (08/05/2022)   Social Connection and Isolation Panel [NHANES]    Frequency of Communication with Friends and Family: More than three times a week    Frequency of Social Gatherings with Friends and Family: Three times a week    Attends Religious Services: More than 4 times per year    Active Member of Clubs or Organizations: No    Attends Archivist Meetings: Never    Marital Status: Married    Tobacco Counseling Counseling given: Not Answered   Clinical Intake:  Pre-visit preparation completed: Yes  Pain : No/denies pain  Diabetes: No  How often do you need to have someone help you  when you read instructions, pamphlets, or other written materials from your doctor or pharmacy?: 1 - Never  Diabetic?No   Interpreter Needed?: No  Information entered by :: Denman George LPN   Activities of Daily Living    08/05/2022   10:49 AM  In your present state of health, do you have any difficulty performing the following activities:  Hearing? 0  Vision? 0  Difficulty concentrating or making decisions? 0  Walking or climbing stairs? 0  Dressing or bathing? 0  Doing errands, shopping? 0  Preparing Food and eating ? N  Using the Toilet? N  In the past six months, have you accidently leaked urine? N  Do you have problems with loss of bowel control? N  Managing your Medications? N  Managing your Finances? N  Housekeeping or managing your Housekeeping? N    Patient Care Team: Mosie Lukes, MD as PCP - General (Family Medicine) Evans Lance, MD as Consulting Physician (Cardiology) Jerrell Belfast, MD as Consulting Physician (Otolaryngology) Ashok Pall, MD as Consulting Physician (Neurosurgery) Marcial Pacas, MD as Consulting Physician (Neurology) Rigoberto Noel, MD as Consulting Physician (Pulmonary Disease)  Indicate any recent Medical Services you may have received from other than [facility] providers in the past year (date  may be approximate).     Assessment:   This is a routine wellness examination for [place].  Hearing/Vision screen Hearing Screening - Comments:: Denies hearing difficulties  Vision Screening - Comments:: Wears rx glasses - up to date with routine eye exams with [place] issues and exercise activities discussed: Current Exercise Habits: Home exercise routine, Type of exercise: walking, Time (Minutes): 30, Frequency (Times/Week): 5, Weekly Exercise (Minutes/Week): 150, Intensity: Mild   Goals Addressed   None    Depression Screen    08/05/2022   12:30 PM 07/13/2021    9:36 AM 03/12/2020    1:21 PM 06/01/2018    9:21 AM 05/29/2017    2:36 PM 06/01/2015    9:24 AM  PHQ 2/9 Scores  PHQ - 2 Score 0 0 0 0 0 0    Fall Risk    08/05/2022   12:34 PM 07/05/2022    8:03 AM 03/12/2020    1:19 PM 06/01/2018    9:21 AM 05/29/2017    2:36 PM  Fall Risk   Falls in the past year? 0 0 0 0 No  Number falls in past yr: 0 0 0    Injury with Fall? 0 0 0    Risk for fall due to :  No Fall Risks     Follow up Falls prevention discussed;Education provided;Falls evaluation completed  Falls evaluation completed      FALL RISK PREVENTION PERTAINING TO THE HOME:  Any stairs in or around the home? Yes  If so, are there any without handrails? No  Home free of loose throw rugs in walkways, pet beds, electrical cords, etc? Yes  Adequate lighting in your home to reduce risk of falls? Yes   ASSISTIVE DEVICES UTILIZED TO PREVENT FALLS:  Life alert? No  Use of a cane, walker or w/c? No  Grab bars in the bathroom? Yes  Shower chair or bench in shower? No  Elevated toilet seat or a handicapped toilet? Yes   TIMED UP AND GO:  Was the test performed? No . Telephonic visit   Cognitive Function:        08/05/2022   10:49 AM  6CIT Screen  What Year?  0 points  What month? 0 points  What time? 0 points  Count back from 20 0 points  Months in reverse 0 points  Repeat phrase  0 points  Total Score 0 points    Immunizations Immunization History  Administered Date(s) Administered   Fluad Quad(high Dose 65+) 06/02/2020, 05/19/2021   Influenza, High Dose Seasonal PF 03/17/2014, 05/04/2015, 04/24/2017, 02/26/2018, 05/30/2019   Influenza,inj,Quad PF,6+ Mos 02/14/2013, 05/11/2015   Influenza-Unspecified 02/14/2013, 05/11/2015   PFIZER(Purple Top)SARS-COV-2 Vaccination 07/04/2019, 07/25/2019, 03/24/2020   Pfizer Covid-19 Vaccine Bivalent Booster 49yr & up 05/19/2021   Pneumococcal Conjugate-13 06/01/2015   Pneumococcal Polysaccharide-23 09/11/2012, 09/11/2012, 02/14/2013   Td 05/29/2017   Zoster Recombinat (Shingrix) 02/22/2018, 08/13/2018   Zoster, Live 04/16/2010    TDAP status: Up to date  Flu Vaccine status: Declined, Education has been provided regarding the importance of this vaccine but patient still declined. Advised may receive this vaccine at local pharmacy or Health Dept. Aware to provide a copy of the vaccination record if obtained from local pharmacy or Health Dept. Verbalized acceptance and understanding.  Pneumococcal vaccine status: Up to date  Covid-19 vaccine status: Information provided on how to obtain vaccines.   Qualifies for Shingles Vaccine? Yes   Zostavax completed No   Shingrix Completed?: Yes  Screening Tests Health Maintenance  Topic Date Due   COVID-19 Vaccine (5 - 2023-24 season) 02/11/2022   INFLUENZA VACCINE  09/11/2022 (Originally 01/11/2022)   Medicare Annual Wellness (AWV)  08/06/2023   COLONOSCOPY (Pts 45-436yrInsurance coverage will need to be confirmed)  04/14/2025   DTaP/Tdap/Td (2 - Tdap) 05/30/2027   Pneumonia Vaccine 6573Years old  Completed   Hepatitis C Screening  Completed   Zoster Vaccines- Shingrix  Completed   HPV VACCINES  Aged Out    Health Maintenance  Health Maintenance Due  Topic Date Due   COVID-19 Vaccine (5 - 2023-24 season) 02/11/2022    Colorectal cancer screening: Type of screening:  Colonoscopy. Completed 04/14/20. Repeat every 5 years  Lung Cancer Screening: (Low Dose CT Chest recommended if Age 76-80ears, 30 pack-year currently smoking OR have quit w/in 15years.) does not qualify.   Lung Cancer Screening Referral: n/a  Additional Screening:  Hepatitis C Screening: does qualify; Completed 03/12/20  Vision Screening: Recommended annual ophthalmology exams for early detection of glaucoma and other disorders of the eye. Is the patient up to date with their annual eye exam?  Yes  Who is the provider or what is the name of the office in which the patient attends annual eye exams? Dr. StGala Romneyf pt is not established with a provider, would they like to be referred to a provider to establish care? No .   Dental Screening: Recommended annual dental exams for proper oral hygiene  Community Resource Referral / Chronic Care Management: CRR required this visit?  No   CCM required this visit?  No      Plan:     I have personally reviewed and noted the following in the patient's chart:   Medical and social history Use of alcohol, tobacco or illicit drugs  Current medications and supplements including opioid prescriptions. Patient is not currently taking opioid prescriptions. Functional ability and status Nutritional status Physical activity Advanced directives List of other physicians Hospitalizations, surgeries, and ER visits in previous 12 months Vitals Screenings to include cognitive, depression, and falls Referrals and appointments  In addition, I have reviewed and discussed with patient certain preventive protocols, quality metrics, and best practice recommendations.  A written personalized care plan for preventive services as well as general preventive health recommendations were provided to patient.     Denman George [place], [place]   [id number]   Due to this being a virtual visit, the after visit summary with patients personalized plan was offered  to patient via mail or my-chart.  per request, patient was mailed a copy of AVS.  Nurse Notes: No concerns

## 2022-08-06 DIAGNOSIS — R69 Illness, unspecified: Secondary | ICD-10-CM | POA: Diagnosis not present

## 2022-11-11 DIAGNOSIS — D225 Melanocytic nevi of trunk: Secondary | ICD-10-CM | POA: Diagnosis not present

## 2022-11-11 DIAGNOSIS — B351 Tinea unguium: Secondary | ICD-10-CM | POA: Diagnosis not present

## 2022-11-11 DIAGNOSIS — Z1283 Encounter for screening for malignant neoplasm of skin: Secondary | ICD-10-CM | POA: Diagnosis not present

## 2022-11-11 DIAGNOSIS — L820 Inflamed seborrheic keratosis: Secondary | ICD-10-CM | POA: Diagnosis not present

## 2022-11-21 NOTE — Assessment & Plan Note (Signed)
Tolerating statin, encouraged heart healthy diet, avoid trans fats, minimize simple carbs and saturated fats. Increase exercise as tolerated 

## 2022-11-21 NOTE — Assessment & Plan Note (Signed)
Patient encouraged to maintain heart healthy diet, regular exercise, adequate sleep. Consider daily probiotics. Take medications as prescribed. Labs ordered and reviewed. Given and reviewed copy of ACP documents from [facility] Secretary of [place] and encouraged to complete and return  Colonoscopy 04/2020

## 2022-11-21 NOTE — Assessment & Plan Note (Signed)
hgba1c acceptable, minimize simple carbs. Increase exercise as tolerated.  

## 2022-11-21 NOTE — Assessment & Plan Note (Signed)
Is following with neurosurgery and endocrinology

## 2022-11-21 NOTE — Assessment & Plan Note (Signed)
.  sbh

## 2022-11-22 ENCOUNTER — Ambulatory Visit (INDEPENDENT_AMBULATORY_CARE_PROVIDER_SITE_OTHER): Payer: Medicare HMO | Admitting: Family Medicine

## 2022-11-22 ENCOUNTER — Encounter: Payer: Self-pay | Admitting: Family Medicine

## 2022-11-22 VITALS — BP 124/72 | HR 62 | Temp 98.0°F | Resp 16 | Ht 68.0 in | Wt 201.8 lb

## 2022-11-22 DIAGNOSIS — R351 Nocturia: Secondary | ICD-10-CM

## 2022-11-22 DIAGNOSIS — E893 Postprocedural hypopituitarism: Secondary | ICD-10-CM

## 2022-11-22 DIAGNOSIS — R252 Cramp and spasm: Secondary | ICD-10-CM | POA: Diagnosis not present

## 2022-11-22 DIAGNOSIS — R739 Hyperglycemia, unspecified: Secondary | ICD-10-CM

## 2022-11-22 DIAGNOSIS — Z0000 Encounter for general adult medical examination without abnormal findings: Secondary | ICD-10-CM | POA: Diagnosis not present

## 2022-11-22 DIAGNOSIS — J4520 Mild intermittent asthma, uncomplicated: Secondary | ICD-10-CM | POA: Diagnosis not present

## 2022-11-22 DIAGNOSIS — E782 Mixed hyperlipidemia: Secondary | ICD-10-CM | POA: Diagnosis not present

## 2022-11-22 DIAGNOSIS — Z86018 Personal history of other benign neoplasm: Secondary | ICD-10-CM | POA: Diagnosis not present

## 2022-11-22 LAB — CBC WITH DIFFERENTIAL/PLATELET
Basophils Absolute: 0.1 10*3/uL (ref 0.0–0.1)
Basophils Relative: 1.1 % (ref 0.0–3.0)
Eosinophils Absolute: 0.2 10*3/uL (ref 0.0–0.7)
Eosinophils Relative: 3.6 % (ref 0.0–5.0)
HCT: 43.8 % (ref 39.0–52.0)
Hemoglobin: 14.1 g/dL (ref 13.0–17.0)
Lymphocytes Relative: 29.8 % (ref 12.0–46.0)
Lymphs Abs: 1.6 10*3/uL (ref 0.7–4.0)
MCHC: 32.3 g/dL (ref 30.0–36.0)
MCV: 83.4 fl (ref 78.0–100.0)
Monocytes Absolute: 0.4 10*3/uL (ref 0.1–1.0)
Monocytes Relative: 7.2 % (ref 3.0–12.0)
Neutro Abs: 3.1 10*3/uL (ref 1.4–7.7)
Neutrophils Relative %: 58.3 % (ref 43.0–77.0)
Platelets: 245 10*3/uL (ref 150.0–400.0)
RBC: 5.25 Mil/uL (ref 4.22–5.81)
RDW: 14.8 % (ref 11.5–15.5)
WBC: 5.4 10*3/uL (ref 4.0–10.5)

## 2022-11-22 LAB — LIPID PANEL
Cholesterol: 229 mg/dL — ABNORMAL HIGH (ref 0–200)
HDL: 43.3 mg/dL (ref >39.00)
LDL Cholesterol: 157 mg/dL — ABNORMAL HIGH (ref 0–99)
NonHDL: 185.62
Total CHOL/HDL Ratio: 5
Triglycerides: 144 mg/dL (ref 0.0–149.0)
VLDL: 28.8 mg/dL (ref 0.0–40.0)

## 2022-11-22 LAB — HEMOGLOBIN A1C: Hgb A1c MFr Bld: 6.1 % (ref 4.6–6.5)

## 2022-11-22 LAB — COMPREHENSIVE METABOLIC PANEL
ALT: 36 U/L (ref 0–53)
AST: 23 U/L (ref 0–37)
Albumin: 4.1 g/dL (ref 3.5–5.2)
Alkaline Phosphatase: 62 U/L (ref 39–117)
BUN: 24 mg/dL — ABNORMAL HIGH (ref 6–23)
CO2: 25 mEq/L (ref 19–32)
Calcium: 8.8 mg/dL (ref 8.4–10.5)
Chloride: 108 mEq/L (ref 96–112)
Creatinine, Ser: 1.01 mg/dL (ref 0.40–1.50)
GFR: 72.55 mL/min (ref >60.00)
Glucose, Bld: 91 mg/dL (ref 70–99)
Potassium: 4.5 mEq/L (ref 3.5–5.1)
Sodium: 141 mEq/L (ref 135–145)
Total Bilirubin: 0.7 mg/dL (ref 0.2–1.2)
Total Protein: 7.6 g/dL (ref 6.0–8.3)

## 2022-11-22 LAB — MAGNESIUM: Magnesium: 2 mg/dL (ref 1.5–2.5)

## 2022-11-22 LAB — TSH: TSH: 0.93 u[IU]/mL (ref 0.35–5.50)

## 2022-11-22 LAB — PSA: PSA: 1.09 ng/mL (ref 0.10–4.00)

## 2022-11-22 LAB — T3, FREE: T3, Free: 2.7 pg/mL (ref 2.3–4.2)

## 2022-11-22 NOTE — Patient Instructions (Addendum)
Daniel Reyes, Respiratory Syncitial Virus Vaccine, Arexvy at pharmacy Tetanus if injured  Preventive Care 65 Years and Older, Male Preventive care refers to lifestyle choices and visits with your health care provider that can promote health and wellness. Preventive care visits are also called wellness exams. What can I expect for my preventive care visit? Counseling During your preventive care visit, your health care provider may ask about your: Medical history, including: Past medical problems. Family medical history. History of falls. Current health, including: Emotional well-being. Home life and relationship well-being. Sexual activity. Memory and ability to understand (cognition). Lifestyle, including: Alcohol, nicotine or tobacco, and drug use. Access to firearms. Diet, exercise, and sleep habits. Work and work Astronomer. Sunscreen use. Safety issues such as seatbelt and bike helmet use. Physical exam Your health care provider will check your: Height and weight. These may be used to calculate your BMI (body mass index). BMI is a measurement that tells if you are at a healthy weight. Waist circumference. This measures the distance around your waistline. This measurement also tells if you are at a healthy weight and may help predict your risk of certain diseases, such as type 2 diabetes and high blood pressure. Heart rate and blood pressure. Body temperature. Skin for abnormal spots. What immunizations do I need?  Vaccines are usually given at various ages, according to a schedule. Your health care provider will recommend vaccines for you based on your age, medical history, and lifestyle or other factors, such as travel or where you work. What tests do I need? Screening Your health care provider may recommend screening tests for certain conditions. This may include: Lipid and cholesterol levels. Diabetes screening. This is done by checking your blood sugar (glucose) after you have  not eaten for a while (fasting). Hepatitis C test. Hepatitis B test. HIV (human immunodeficiency virus) test. STI (sexually transmitted infection) testing, if you are at risk. Lung cancer screening. Colorectal cancer screening. Prostate cancer screening. Abdominal aortic aneurysm (AAA) screening. You may need this if you are a current or former smoker. Talk with your health care provider about your test results, treatment options, and if necessary, the need for more tests. Follow these instructions at home: Eating and drinking  Eat a diet that includes fresh fruits and vegetables, whole grains, lean protein, and low-fat dairy products. Limit your intake of foods with high amounts of sugar, saturated fats, and salt. Take vitamin and mineral supplements as recommended by your health care provider. Do not drink alcohol if your health care provider tells you not to drink. If you drink alcohol: Limit how much you have to 0-2 drinks a day. Know how much alcohol is in your drink. In the [place] one drink equals one 12 oz bottle of beer (355 mL), one 5 oz glass of wine (148 mL), or one 1 oz glass of hard liquor (44 mL). Lifestyle Brush your teeth every morning and night with fluoride toothpaste. Floss one time each day. Exercise for at least 30 minutes 5 or more days each week. Do not use any products that contain nicotine or tobacco. These products include cigarettes, chewing tobacco, and vaping devices, such as e-cigarettes. If you need help quitting, ask your health care provider. Do not use drugs. If you are sexually active, practice safe sex. Use a condom or other form of protection to prevent STIs. Take aspirin only as told by your health care provider. Make sure that you understand how much to take and what form to take. Work  with your health care provider to find out whether it is safe and beneficial for you to take aspirin daily. Ask your health care provider if you need to take a  cholesterol-lowering medicine (statin). Find healthy ways to manage stress, such as: Meditation, yoga, or listening to music. Journaling. Talking to a trusted person. Spending time with friends and family. Safety Always wear your seat belt while driving or riding in a vehicle. Do not drive: If you have been drinking alcohol. Do not ride with someone who has been drinking. When you are tired or distracted. While texting. If you have been using any mind-altering substances or drugs. Wear a helmet and other protective equipment during sports activities. If you have firearms in your house, make sure you follow all gun safety procedures. Minimize exposure to UV radiation to reduce your risk of skin cancer. What's next? Visit your health care provider once a year for an annual wellness visit. Ask your health care provider how often you should have your eyes and teeth checked. Stay up to date on all vaccines. This information is not intended to replace advice given to you by your health care provider. Make sure you discuss any questions you have with your health care provider. Document Revised: 11/25/2020 Document Reviewed: 11/25/2020 Elsevier Patient Education  2024 [employer].

## 2022-11-22 NOTE — Progress Notes (Signed)
Subjective:   By signing my name below, I, Vickey Sages, attest that this documentation has been prepared under the direction and in the presence of Bradd Canary, MD., 11/22/2022.   Patient ID: Daniel Reyes, male    DOB: 25-Jan-1946, 77 y.o.   MRN: [id number]  No chief complaint on file.  HPI Patient is in today for a comprehensive physical exam and follow up on chronic medical concerns. He denies recent hospitalization, febrile illness, CP/palpitations/SOB/HA/fever/chills/GI or GU symptoms.  Nasal Congestion Patient reports that his left nostril often becomes blocked which is bothersome and occasionally keeping him awake at night. He suspects this is due to the pituitary resection he underwent on 02/14/2020. He continues to follow with neurosurgeon Dr. Coletta Memos since the procedure and will consider seeing an ENT to manage his nasal congestion. He currently uses Fluticasone nasal spray and Navage Nasal Care as needed, which provide temporary relief.  Weight Management Patient remains active by running regularly and fishing but states that he is unhappy with his increased weight. His weight today is 201 lbs and his body mass index is 30.68 kg/m. He maintains a healthy diet and chooses to manage his increased weight through lifestyle adjustments. Wt Readings from Last 3 Encounters:  11/22/22 201 lb 12.8 oz (91.5 kg)  08/05/22 197 lb (89.4 kg)  07/05/22 197 lb 12.8 oz (89.7 kg)   Past Medical History:  Diagnosis Date   Dyspnea    GERD (gastroesophageal reflux disease)    History of chicken pox    Hyperlipemia    Mild CAD    a. cath 02/09/17 - Mild to moderate proximal and mid LAD plaque with less than 40% narrowing   Nocturnal hypoxia    Presence of permanent cardiac pacemaker 2019   approximately 2 years ago   Past Surgical History:  Procedure Laterality Date   BACK SURGERY  1980   CARDIAC CATHETERIZATION  02/09/2017   CHOLECYSTECTOMY OPEN  1998   CRANIOTOMY N/A 02/14/2020    Procedure: Transphenoidal resection of pituitary tumor;  Surgeon: Coletta Memos, MD;  Location: [facility] OR;  Service: Neurosurgery;  Laterality: N/A;   gerd surgery     INSERT / REPLACE / REMOVE PACEMAKER     NECK SURGERY  1983   PACEMAKER IMPLANT N/A 05/15/2018   Procedure: PACEMAKER IMPLANT - daul chamber;  Surgeon: Marinus Maw, MD;  Location: [facility] INVASIVE CV LAB;  Service: Cardiovascular;  Laterality: N/A;   RIGHT/LEFT HEART CATH AND CORONARY ANGIOGRAPHY N/A 02/09/2017   Procedure: RIGHT/LEFT HEART CATH AND CORONARY ANGIOGRAPHY;  Surgeon: Lyn Records, MD;  Location: [facility] INVASIVE CV LAB;  Service: Cardiovascular;  Laterality: N/A;   TONSILLECTOMY     TONSILLECTOMY AND ADENOIDECTOMY     TRANSPHENOIDAL APPROACH EXPOSURE N/A 02/14/2020   Procedure: TRANSPHENOIDAL APPROACH EXPOSURE;  Surgeon: Osborn Coho, MD;  Location: [facility] OR;  Service: ENT;  Laterality: N/A;    Family History  Problem Relation Age of Onset   Heart attack Father    Colon cancer Father    Cancer Father    Alzheimer's disease Mother    Stroke Brother    Heart disease Brother        afib   Stroke Maternal Grandmother    Mental illness Maternal Grandfather        suicide   Alzheimer's disease Paternal Grandmother    Peripheral vascular disease Paternal Grandfather    Alcohol abuse Neg Hx     Social History   Socioeconomic History  Marital status: Married    Spouse name: Not on file   Number of children: 3   Years of education: Not on file   Highest education level: Not on file  Occupational History   Occupation: retired-owner of Production assistant, 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 Rare alcohol, wears seat belt most of the time   Social Determinants of Health   Financial Resource Strain: Low Risk  (08/05/2022)   Overall Financial Resource Strain (CARDIA)    Difficulty of Paying Living Expenses: Not hard at all  Food Insecurity: No Food Insecurity (08/05/2022)   Hunger Vital Sign    Worried About Running Out of Food in the Last Year: Never true    Ran Out of Food in the Last Year: Never true  Transportation Needs: No Transportation Needs (08/05/2022)   PRAPARE - Administrator, Civil Service (Medical): No    Lack of Transportation (Non-Medical): No  Physical Activity: Sufficiently Active (08/05/2022)   Exercise Vital Sign    Days of Exercise per Week: 5 days    Minutes of Exercise per Session: 30 min  Stress: No Stress Concern Present (08/05/2022)   [employer] of Occupational Health - Occupational Stress Questionnaire    Feeling of Stress : Not at all  Social Connections: Moderately Integrated (08/05/2022)   Social Connection and Isolation Panel [NHANES]    Frequency of Communication with Friends and Family: More than three times a week    Frequency of Social Gatherings with Friends and Family: Three times a week    Attends Religious Services: More than 4 times per year    Active Member of Clubs or Organizations: No    Attends Banker Meetings: Never    Marital Status: Married  Catering manager Violence: Not At Risk (08/05/2022)   Humiliation, Afraid, Rape, and Kick questionnaire    Fear of Current or Ex-Partner: No    Emotionally Abused: No    Physically Abused: No    Sexually Abused: No    Outpatient Medications Prior to Visit  Medication Sig Dispense Refill   albuterol (VENTOLIN HFA) 108 (90 Base) MCG/ACT inhaler Inhale 2 puffs into the lungs every 6 (six) hours as needed for wheezing or shortness of breath. (Patient not taking: Reported on 08/05/2022) 1 each 2   fluticasone (FLONASE)  50 MCG/ACT nasal spray Place 2 sprays into both nostrils daily. 16 g 6   No facility-administered medications prior to visit.    Allergies  Allergen Reactions   Imdur [Isosorbide Dinitrate] Anaphylaxis and Nausea And Vomiting    Nose swelling with difficulty breathing thru nose    Review of Systems  Constitutional:  Negative for chills and fever.  Respiratory:  Negative for shortness of breath.   Cardiovascular:  Negative for chest pain and palpitations.  Gastrointestinal:  Negative for abdominal pain, blood in stool, constipation, diarrhea, nausea and vomiting.  Genitourinary:  Negative for dysuria, frequency, hematuria and urgency.  Skin:           Neurological:  Negative for headaches.  Objective:    Physical Exam Constitutional:      General: He is not in acute distress.    Appearance: Normal appearance. He is not ill-appearing.  HENT:     Head: Normocephalic and atraumatic.     Right Ear: Tympanic membrane, ear canal and external ear normal.     Left Ear: Tympanic membrane, ear canal and external ear normal.     Nose: Nose normal.     Mouth/Throat:     Mouth: Mucous membranes are dry.     Pharynx: Oropharynx is clear.  Eyes:     General:        Right eye: No discharge.        Left eye: No discharge.     Extraocular Movements: Extraocular movements intact.     Right eye: No nystagmus.     Left eye: No nystagmus.     Pupils: Pupils are equal, round, and reactive to light.  Neck:     Vascular: No carotid bruit.  Cardiovascular:     Rate and Rhythm: Normal rate and regular rhythm.     Pulses: Normal pulses.     Heart sounds: Normal heart sounds. No murmur heard.    No gallop.  Pulmonary:     Effort: Pulmonary effort is normal. No respiratory distress.     Breath sounds: Normal breath sounds. No wheezing or rales.  Abdominal:     General: Bowel sounds are normal.     Palpations: Abdomen is soft.     Tenderness: There is no abdominal tenderness. There is  no guarding.  Musculoskeletal:        General: Normal range of motion.     Cervical back: Normal range of motion.     Right lower leg: No edema.     Left lower leg: No edema.     Comments: Muscle strength 5/5 on upper and lower extremities.   Lymphadenopathy:     Cervical: No cervical adenopathy.  Skin:    General: Skin is warm and dry.  Neurological:     Mental Status: He is alert and oriented to person, place, and time.     Sensory: Sensation is intact.     Motor: Motor function is intact.     Coordination: Coordination is intact.     Deep Tendon Reflexes:     Reflex Scores:      Patellar reflexes are 2+ on the right side and 2+ on the left side. Psychiatric:        Mood and Affect: Mood normal.        Behavior: Behavior normal.        Judgment: Judgment normal.     There were no vitals taken for this visit. Wt Readings from Last 3 Encounters:  08/05/22 197 lb (89.4 kg)  07/05/22 197 lb 12.8 oz (89.7 kg)  07/13/21 197 lb 8 oz (89.6 kg)    Diabetic Foot Exam - Simple   No data filed    Lab Results  Component Value Date   WBC 5.2 02/18/2021   HGB 14.1 02/18/2021   HCT 42.3 02/18/2021   PLT 218.0 02/18/2021   GLUCOSE 86 02/18/2021   CHOL 253 (H) 02/18/2021   TRIG 123.0 02/18/2021   HDL 51.30 02/18/2021   LDLCALC 177 (H) 02/18/2021   ALT 22 02/18/2021   AST 18 02/18/2021   NA 139 02/18/2021   K 5.0 02/18/2021   CL 106 02/18/2021   CREATININE 1.08 02/18/2021   BUN 19  02/18/2021   CO2 27 02/18/2021   TSH 1.54 02/18/2021   PSA 0.53 03/12/2020   INR 1.05 02/08/2017   HGBA1C 5.7 02/18/2021    Lab Results  Component Value Date   TSH 1.54 02/18/2021   Lab Results  Component Value Date   WBC 5.2 02/18/2021   HGB 14.1 02/18/2021   HCT 42.3 02/18/2021   MCV 84.2 02/18/2021   PLT 218.0 02/18/2021   Lab Results  Component Value Date   NA 139 02/18/2021   K 5.0 02/18/2021   CO2 27 02/18/2021   GLUCOSE 86 02/18/2021   BUN 19 02/18/2021   CREATININE  1.08 02/18/2021   BILITOT 0.8 02/18/2021   ALKPHOS 53 02/18/2021   AST 18 02/18/2021   ALT 22 02/18/2021   PROT 7.4 02/18/2021   ALBUMIN 4.2 02/18/2021   CALCIUM 9.4 02/18/2021   ANIONGAP 10 02/17/2020   GFR 67.77 02/18/2021   Lab Results  Component Value Date   CHOL 253 (H) 02/18/2021   Lab Results  Component Value Date   HDL 51.30 02/18/2021   Lab Results  Component Value Date   LDLCALC 177 (H) 02/18/2021   Lab Results  Component Value Date   TRIG 123.0 02/18/2021   Lab Results  Component Value Date   CHOLHDL 5 02/18/2021   Lab Results  Component Value Date   HGBA1C 5.7 02/18/2021      Assessment & Plan:  Colonoscopy: Last completed on 04/14/2020. Impression: - Diverticulosis in the sigmoid colon and in the descending colon. - The distal rectum and anal verge are normal on retroflexion views. - No specimens collected. No repeat colonoscopy due to age and the absence of advanced adenomas.   PSA: Last checked on 03/12/2020 at 0.53 ng/mL. Repeat PSA check recommended in 2-3 years.  Advanced Directives: Encouraged patient to complete advanced care planning documents.  Immunizations: Encouraged patient to consider annual COVID-19 and Influenza vaccinations as well as RSV vaccination.  Labs: Routine blood work ordered.  Hyperglycemia and Increased Weight: This is monitored and controlled. Encouraged 6-8 hours of sleep, heart healthy diet, 60-80 oz of non-alcohol/non-caffeinated fluids, and 4000-8000 steps daily.  Hyperlipidemia: This is monitored. Patient does not currently take any medications.  Nasal Congestion: Recommended Afrin as needed. Problem List Items Addressed This Visit       Other   Hyperlipidemia   Routine general medical examination at a health care facility   Hyperglycemia   Muscle cramps   Status post transsphenoidal pituitary resection (HCC) - Primary   No orders of the defined types were placed in this encounter.  I, Vickey Sages,  personally preformed the services described in this documentation.  All medical record entries made by the scribe were at my direction and in my presence.  I have reviewed the chart and discharge instructions (if applicable) and agree that the record reflects my personal performance and is accurate and complete. 11/22/2022  I,Mohammed Iqbal,acting as a scribe for Danise Edge, MD.,have documented all relevant documentation on the behalf of Danise Edge, MD,as directed by  Danise Edge, MD while in the presence of Danise Edge, MD.  Vickey Sages

## 2022-11-23 ENCOUNTER — Encounter: Payer: Self-pay | Admitting: Family Medicine

## 2022-11-23 DIAGNOSIS — Z86018 Personal history of other benign neoplasm: Secondary | ICD-10-CM | POA: Insufficient documentation

## 2022-11-23 NOTE — Assessment & Plan Note (Signed)
Has done well since his surgery. Just notes some increased congestion. Not bad enough to accept a  referral to ENT yet but will let us know if he wants a referral

## 2022-11-28 ENCOUNTER — Ambulatory Visit (INDEPENDENT_AMBULATORY_CARE_PROVIDER_SITE_OTHER): Payer: Medicare HMO

## 2022-11-28 DIAGNOSIS — I495 Sick sinus syndrome: Secondary | ICD-10-CM

## 2022-11-29 LAB — CUP PACEART REMOTE DEVICE CHECK
Battery Remaining Longevity: 108 mo
Battery Remaining Percentage: 100 %
Brady Statistic RA Percent Paced: 75 %
Brady Statistic RV Percent Paced: 2 %
Date Time Interrogation Session: 20240617030400
Implantable Lead Connection Status: 753985
Implantable Lead Connection Status: 753985
Implantable Lead Implant Date: 20191203
Implantable Lead Implant Date: 20191203
Implantable Lead Location: 753859
Implantable Lead Location: 753860
Implantable Lead Model: 7740
Implantable Lead Model: 7741
Implantable Lead Serial Number: 1095486
Implantable Lead Serial Number: 711800
Implantable Pulse Generator Implant Date: 20191203
Lead Channel Impedance Value: 584 Ohm
Lead Channel Impedance Value: 656 Ohm
Lead Channel Pacing Threshold Amplitude: 0.4 V
Lead Channel Pacing Threshold Amplitude: 1 V
Lead Channel Pacing Threshold Pulse Width: 0.4 ms
Lead Channel Pacing Threshold Pulse Width: 0.4 ms
Lead Channel Setting Pacing Amplitude: 3.5 V
Lead Channel Setting Pacing Amplitude: 3.5 V
Lead Channel Setting Pacing Pulse Width: 0.4 ms
Lead Channel Setting Sensing Sensitivity: 2.5 mV
Pulse Gen Serial Number: 838922
Zone Setting Status: 755011

## 2022-12-19 NOTE — Progress Notes (Signed)
Remote pacemaker transmission.   

## 2023-03-13 ENCOUNTER — Other Ambulatory Visit (HOSPITAL_COMMUNITY): Payer: Self-pay | Admitting: Neurosurgery

## 2023-03-13 DIAGNOSIS — D497 Neoplasm of unspecified behavior of endocrine glands and other parts of nervous system: Secondary | ICD-10-CM

## 2023-03-30 ENCOUNTER — Ambulatory Visit (INDEPENDENT_AMBULATORY_CARE_PROVIDER_SITE_OTHER): Payer: Medicare HMO

## 2023-03-30 DIAGNOSIS — I495 Sick sinus syndrome: Secondary | ICD-10-CM | POA: Diagnosis not present

## 2023-03-30 LAB — CUP PACEART REMOTE DEVICE CHECK
Battery Remaining Longevity: 102 mo
Battery Remaining Percentage: 97 %
Brady Statistic RA Percent Paced: 75 %
Brady Statistic RV Percent Paced: 2 %
Date Time Interrogation Session: 20241017081300
Implantable Lead Connection Status: 753985
Implantable Lead Connection Status: 753985
Implantable Lead Implant Date: 20191203
Implantable Lead Implant Date: 20191203
Implantable Lead Location: 753859
Implantable Lead Location: 753860
Implantable Lead Model: 7740
Implantable Lead Model: 7741
Implantable Lead Serial Number: 1095486
Implantable Lead Serial Number: 711800
Implantable Pulse Generator Implant Date: 20191203
Lead Channel Impedance Value: 604 Ohm
Lead Channel Impedance Value: 680 Ohm
Lead Channel Pacing Threshold Amplitude: 0.4 V
Lead Channel Pacing Threshold Amplitude: 1.1 V
Lead Channel Pacing Threshold Pulse Width: 0.4 ms
Lead Channel Pacing Threshold Pulse Width: 0.4 ms
Lead Channel Setting Pacing Amplitude: 3.5 V
Lead Channel Setting Pacing Amplitude: 3.5 V
Lead Channel Setting Pacing Pulse Width: 0.4 ms
Lead Channel Setting Sensing Sensitivity: 2.5 mV
Pulse Gen Serial Number: 838922
Zone Setting Status: 755011

## 2023-04-18 NOTE — Progress Notes (Signed)
Remote pacemaker transmission.   

## 2023-05-25 ENCOUNTER — Telehealth: Payer: Self-pay | Admitting: Family Medicine

## 2023-05-25 ENCOUNTER — Ambulatory Visit: Payer: Medicare HMO | Admitting: Family Medicine

## 2023-05-25 NOTE — Telephone Encounter (Signed)
Pt called to ask if, since he had to reschedule his appt with Dr. Abner Greenspan, could do the lab work for his 6 month follow up and put the follow up on the back burner for now. Please advise.

## 2023-05-26 ENCOUNTER — Ambulatory Visit: Payer: Medicare HMO | Admitting: Physician Assistant

## 2023-05-26 NOTE — Telephone Encounter (Signed)
Staff called patient and said he had to reschedule his 05/25/2023 appointment. He would like to know if he skip the follow up appointment and just come in and do the blood work?

## 2023-05-29 ENCOUNTER — Other Ambulatory Visit: Payer: Self-pay | Admitting: Emergency Medicine

## 2023-05-29 DIAGNOSIS — J4520 Mild intermittent asthma, uncomplicated: Secondary | ICD-10-CM

## 2023-05-29 DIAGNOSIS — R252 Cramp and spasm: Secondary | ICD-10-CM

## 2023-05-29 DIAGNOSIS — Z86018 Personal history of other benign neoplasm: Secondary | ICD-10-CM

## 2023-05-29 DIAGNOSIS — E782 Mixed hyperlipidemia: Secondary | ICD-10-CM

## 2023-05-29 DIAGNOSIS — R351 Nocturia: Secondary | ICD-10-CM

## 2023-05-29 DIAGNOSIS — R739 Hyperglycemia, unspecified: Secondary | ICD-10-CM

## 2023-05-29 NOTE — Telephone Encounter (Signed)
Future lab orders have been place. Pt notified and said he will call and set up his appointment. Already has a CPE scheduled for next June

## 2023-06-26 ENCOUNTER — Ambulatory Visit (HOSPITAL_COMMUNITY): Payer: Medicare HMO

## 2023-06-29 ENCOUNTER — Ambulatory Visit (INDEPENDENT_AMBULATORY_CARE_PROVIDER_SITE_OTHER): Payer: Medicare HMO

## 2023-06-29 DIAGNOSIS — J0100 Acute maxillary sinusitis, unspecified: Secondary | ICD-10-CM | POA: Diagnosis not present

## 2023-06-29 DIAGNOSIS — Z6826 Body mass index (BMI) 26.0-26.9, adult: Secondary | ICD-10-CM | POA: Diagnosis not present

## 2023-06-29 DIAGNOSIS — I495 Sick sinus syndrome: Secondary | ICD-10-CM | POA: Diagnosis not present

## 2023-06-29 LAB — CUP PACEART REMOTE DEVICE CHECK
Battery Remaining Longevity: 102 mo
Battery Remaining Percentage: 97 %
Brady Statistic RA Percent Paced: 75 %
Brady Statistic RV Percent Paced: 2 %
Date Time Interrogation Session: 20250116025100
Implantable Lead Connection Status: 753985
Implantable Lead Connection Status: 753985
Implantable Lead Implant Date: 20191203
Implantable Lead Implant Date: 20191203
Implantable Lead Location: 753859
Implantable Lead Location: 753860
Implantable Lead Model: 7740
Implantable Lead Model: 7741
Implantable Lead Serial Number: 1095486
Implantable Lead Serial Number: 711800
Implantable Pulse Generator Implant Date: 20191203
Lead Channel Impedance Value: 576 Ohm
Lead Channel Impedance Value: 679 Ohm
Lead Channel Pacing Threshold Amplitude: 0.4 V
Lead Channel Pacing Threshold Amplitude: 1.2 V
Lead Channel Pacing Threshold Pulse Width: 0.4 ms
Lead Channel Pacing Threshold Pulse Width: 0.4 ms
Lead Channel Setting Pacing Amplitude: 3.5 V
Lead Channel Setting Pacing Amplitude: 3.5 V
Lead Channel Setting Pacing Pulse Width: 0.4 ms
Lead Channel Setting Sensing Sensitivity: 2.5 mV
Pulse Gen Serial Number: 838922
Zone Setting Status: 755011

## 2023-07-11 ENCOUNTER — Telehealth: Payer: Self-pay | Admitting: Family Medicine

## 2023-07-11 NOTE — Telephone Encounter (Signed)
Copied from CRM [id number]. Topic: Medicare AWV >> Jul 11, 2023 10:31 AM Payton Doughty wrote: Reason for CRM: Called LVM 07/11/2023 to schedule AWV. Please schedule Virtual or Telehealth visits ONLY.   Verlee Rossetti; Care Guide Ambulatory Clinical Support [employer] [facility] Direct Dial: [phone]

## 2023-07-25 ENCOUNTER — Ambulatory Visit (HOSPITAL_COMMUNITY)
Admission: RE | Admit: 2023-07-25 | Discharge: 2023-07-25 | Disposition: A | Discharge status: Home / Self Care | Payer: Medicare HMO | Source: Ambulatory Visit | Attending: Neurosurgery | Admitting: Neurosurgery

## 2023-07-25 DIAGNOSIS — J322 Chronic ethmoidal sinusitis: Secondary | ICD-10-CM | POA: Diagnosis not present

## 2023-07-25 DIAGNOSIS — J320 Chronic maxillary sinusitis: Secondary | ICD-10-CM | POA: Diagnosis not present

## 2023-07-25 DIAGNOSIS — R220 Localized swelling, mass and lump, head: Secondary | ICD-10-CM | POA: Diagnosis not present

## 2023-07-25 DIAGNOSIS — D352 Benign neoplasm of pituitary gland: Secondary | ICD-10-CM | POA: Diagnosis not present

## 2023-07-25 DIAGNOSIS — D497 Neoplasm of unspecified behavior of endocrine glands and other parts of nervous system: Secondary | ICD-10-CM | POA: Insufficient documentation

## 2023-07-25 MED ORDER — GADOBUTROL 1 MMOL/ML IV SOLN
9.0000 mL | Freq: Once | INTRAVENOUS | Status: AC | PRN
  Administered 2023-07-25: 9 mL via INTRAVENOUS

## 2023-08-07 NOTE — Progress Notes (Signed)
 Remote pacemaker transmission.

## 2023-09-11 DIAGNOSIS — D497 Neoplasm of unspecified behavior of endocrine glands and other parts of nervous system: Secondary | ICD-10-CM | POA: Diagnosis not present

## 2023-09-19 DIAGNOSIS — J342 Deviated nasal septum: Secondary | ICD-10-CM | POA: Insufficient documentation

## 2023-09-19 DIAGNOSIS — E236 Other disorders of pituitary gland: Secondary | ICD-10-CM | POA: Diagnosis not present

## 2023-09-28 ENCOUNTER — Ambulatory Visit (INDEPENDENT_AMBULATORY_CARE_PROVIDER_SITE_OTHER): Payer: Medicare HMO

## 2023-09-28 DIAGNOSIS — I495 Sick sinus syndrome: Secondary | ICD-10-CM | POA: Diagnosis not present

## 2023-09-30 LAB — CUP PACEART REMOTE DEVICE CHECK
Battery Remaining Longevity: 96 mo
Battery Remaining Percentage: 94 %
Brady Statistic RA Percent Paced: 75 %
Brady Statistic RV Percent Paced: 2 %
Date Time Interrogation Session: 20250417025100
Implantable Lead Connection Status: 753985
Implantable Lead Connection Status: 753985
Implantable Lead Implant Date: 20191203
Implantable Lead Implant Date: 20191203
Implantable Lead Location: 753859
Implantable Lead Location: 753860
Implantable Lead Model: 7740
Implantable Lead Model: 7741
Implantable Lead Serial Number: 1095486
Implantable Lead Serial Number: 711800
Implantable Pulse Generator Implant Date: 20191203
Lead Channel Impedance Value: 622 Ohm
Lead Channel Impedance Value: 678 Ohm
Lead Channel Pacing Threshold Amplitude: 0.4 V
Lead Channel Pacing Threshold Amplitude: 0.8 V
Lead Channel Pacing Threshold Pulse Width: 0.4 ms
Lead Channel Pacing Threshold Pulse Width: 0.4 ms
Lead Channel Setting Pacing Amplitude: 3.5 V
Lead Channel Setting Pacing Amplitude: 3.5 V
Lead Channel Setting Pacing Pulse Width: 0.4 ms
Lead Channel Setting Sensing Sensitivity: 2.5 mV
Pulse Gen Serial Number: 838922
Zone Setting Status: 755011

## 2023-10-03 ENCOUNTER — Encounter: Payer: Self-pay | Admitting: Internal Medicine

## 2023-11-08 NOTE — Addendum Note (Signed)
 Addended by: Lott Rouleau A on: 11/08/2023 10:32 AM   Modules accepted: Orders

## 2023-11-08 NOTE — Progress Notes (Signed)
 Remote pacemaker transmission.

## 2023-11-27 ENCOUNTER — Encounter: Payer: Medicare HMO | Admitting: Family Medicine

## 2023-12-08 ENCOUNTER — Ambulatory Visit (INDEPENDENT_AMBULATORY_CARE_PROVIDER_SITE_OTHER): Admitting: Family Medicine

## 2023-12-08 ENCOUNTER — Other Ambulatory Visit

## 2023-12-08 ENCOUNTER — Encounter: Payer: Self-pay | Admitting: Family Medicine

## 2023-12-08 VITALS — BP 123/80 | HR 60 | Ht 68.0 in | Wt 185.0 lb

## 2023-12-08 DIAGNOSIS — J4520 Mild intermittent asthma, uncomplicated: Secondary | ICD-10-CM | POA: Diagnosis not present

## 2023-12-08 DIAGNOSIS — Z86018 Personal history of other benign neoplasm: Secondary | ICD-10-CM

## 2023-12-08 DIAGNOSIS — R252 Cramp and spasm: Secondary | ICD-10-CM

## 2023-12-08 DIAGNOSIS — R351 Nocturia: Secondary | ICD-10-CM

## 2023-12-08 DIAGNOSIS — R21 Rash and other nonspecific skin eruption: Secondary | ICD-10-CM

## 2023-12-08 DIAGNOSIS — R739 Hyperglycemia, unspecified: Secondary | ICD-10-CM | POA: Diagnosis not present

## 2023-12-08 DIAGNOSIS — E782 Mixed hyperlipidemia: Secondary | ICD-10-CM | POA: Diagnosis not present

## 2023-12-08 LAB — COMPREHENSIVE METABOLIC PANEL WITH GFR
ALT: 24 U/L (ref 0–53)
AST: 20 U/L (ref 0–37)
Albumin: 4.3 g/dL (ref 3.5–5.2)
Alkaline Phosphatase: 63 U/L (ref 39–117)
BUN: 22 mg/dL (ref 6–23)
CO2: 29 meq/L (ref 19–32)
Calcium: 9.5 mg/dL (ref 8.4–10.5)
Chloride: 105 meq/L (ref 96–112)
Creatinine, Ser: 1.09 mg/dL (ref 0.40–1.50)
GFR: 65.72 mL/min (ref >60.00)
Glucose, Bld: 104 mg/dL — ABNORMAL HIGH (ref 70–99)
Potassium: 4.8 meq/L (ref 3.5–5.1)
Sodium: 140 meq/L (ref 135–145)
Total Bilirubin: 0.7 mg/dL (ref 0.2–1.2)
Total Protein: 7.6 g/dL (ref 6.0–8.3)

## 2023-12-08 LAB — LIPID PANEL
Cholesterol: 245 mg/dL — ABNORMAL HIGH (ref 0–200)
HDL: 50.3 mg/dL (ref >39.00)
LDL Cholesterol: 178 mg/dL — ABNORMAL HIGH (ref 0–99)
NonHDL: 194.98
Total CHOL/HDL Ratio: 5
Triglycerides: 87 mg/dL (ref 0.0–149.0)
VLDL: 17.4 mg/dL (ref 0.0–40.0)

## 2023-12-08 LAB — CBC WITH DIFFERENTIAL/PLATELET
Basophils Absolute: 0 10*3/uL (ref 0.0–0.1)
Basophils Relative: 0.8 % (ref 0.0–3.0)
Eosinophils Absolute: 0.2 10*3/uL (ref 0.0–0.7)
Eosinophils Relative: 3.4 % (ref 0.0–5.0)
HCT: 43.5 % (ref 39.0–52.0)
Hemoglobin: 14.7 g/dL (ref 13.0–17.0)
Lymphocytes Relative: 25.9 % (ref 12.0–46.0)
Lymphs Abs: 1.3 10*3/uL (ref 0.7–4.0)
MCHC: 33.7 g/dL (ref 30.0–36.0)
MCV: 83.2 fl (ref 78.0–100.0)
Monocytes Absolute: 0.4 10*3/uL (ref 0.1–1.0)
Monocytes Relative: 7.2 % (ref 3.0–12.0)
Neutro Abs: 3.2 10*3/uL (ref 1.4–7.7)
Neutrophils Relative %: 62.7 % (ref 43.0–77.0)
Platelets: 228 10*3/uL (ref 150.0–400.0)
RBC: 5.22 Mil/uL (ref 4.22–5.81)
RDW: 14.9 % (ref 11.5–15.5)
WBC: 5.1 10*3/uL (ref 4.0–10.5)

## 2023-12-08 LAB — TSH: TSH: 1.5 u[IU]/mL (ref 0.35–5.50)

## 2023-12-08 LAB — MAGNESIUM: Magnesium: 2.3 mg/dL (ref 1.5–2.5)

## 2023-12-08 LAB — HEMOGLOBIN A1C: Hgb A1c MFr Bld: 6 % (ref 4.6–6.5)

## 2023-12-08 LAB — T3, FREE: T3, Free: 2.9 pg/mL (ref 2.3–4.2)

## 2023-12-08 MED ORDER — CLOTRIMAZOLE 1 % EX CREA: 1.0000 | TOPICAL_CREAM | Freq: Two times a day (BID) | CUTANEOUS | 0 refills | Status: DC

## 2023-12-08 MED ORDER — TRIAMCINOLONE ACETONIDE 0.1 % EX CREA: 1.0000 | TOPICAL_CREAM | Freq: Two times a day (BID) | CUTANEOUS | 0 refills | Status: DC

## 2023-12-08 NOTE — Progress Notes (Signed)
 Acute Office Visit  Subjective:     Patient ID: Daniel Reyes, male    DOB: June 24, 1945, 78 y.o.   MRN: [id number]  Chief Complaint  Patient presents with   Pruritis    HPI Patient is in today for groin itching/rash.   Discussed the use of AI scribe software for clinical note transcription with the patient, who gave verbal consent to proceed.  History of Present Illness Daniel Reyes is a 78 year old male who presents with a persistent groin rash and itching.  He has been experiencing a brownish-red rash in the groin area intermittently for several months, sparing the scrotum. The rash is sporadic, with itching episodes lasting about three minutes and then subsiding for a couple of days. There is no oozing, drainage, or open sores associated with the rash.  No burning sensation during urination or issues with the penis. No recent changes in soaps, diet, or medications. The rash does not appear elsewhere on his body.  He has been using a steroid cream, which he has had for approximately ten to fifteen years, and finds it somewhat helpful. He also mentions having issues with sun exposure and rashes in the summertime, for which the cream is beneficial.   No strong odor from the rash, although he notes that his sense of smell is not very strong. The rash might have worsened with the onset of hot weather, although he is not certain.         ROS All review of systems negative except what is listed in the HPI      Objective:    BP 123/80   Pulse 60   Ht 5' 8 (1.727 m)   Wt 185 lb (83.9 kg)   SpO2 98%   BMI 28.13 kg/m     Physical Exam Vitals reviewed.  Constitutional:      Appearance: Normal appearance.  Genitourinary:    Comments: Deferred, reports no scrotal/penile involvement  Skin:    Findings: Rash present.     Comments: Mildly erythematous to bilateral groin   Neurological:     Mental Status: He is alert and oriented to person, place, and time.    Psychiatric:        Mood and Affect: Mood normal.        Behavior: Behavior normal.        Thought Content: Thought content normal.        No results found for any visits on 12/08/23.      Assessment & Plan:   Problem List Items Addressed This Visit       Active Problems   Hyperlipidemia   Hyperglycemia   Asthma   Muscle cramps   Nocturia   History of pituitary adenoma   Other Visit Diagnoses       Rash    -  Primary   Relevant Medications   triamcinolone  cream (KENALOG ) 0.1 %   clotrimazole (LOTRIMIN) 1 % cream      Assessment & Plan Fungal groin infection Intermittent itching and erythematous rash in groin suggest fungal infection, likely worsened by heat and sweat.  - Prescribed antifungal cream for groin application. - Advised discontinuation of steroid cream in groin area, use antifungal cream instead.   Reports he need his triamcinolone  refilled for other various rashes - uses very rarely.     Meds ordered this encounter  Medications   triamcinolone  cream (KENALOG ) 0.1 %    Sig: Apply 1 Application topically 2 (two)  times daily.    Dispense:  30 g    Refill:  0   clotrimazole (LOTRIMIN) 1 % cream    Sig: Apply 1 Application topically 2 (two) times daily. Use for 7-14 days.    Dispense:  30 g    Refill:  0    Return if symptoms worsen or fail to improve.  Waddell KATHEE Mon, NP

## 2023-12-10 ENCOUNTER — Ambulatory Visit: Payer: Self-pay | Admitting: Family Medicine

## 2023-12-12 ENCOUNTER — Ambulatory Visit

## 2023-12-28 ENCOUNTER — Ambulatory Visit: Payer: Medicare HMO

## 2023-12-28 DIAGNOSIS — I495 Sick sinus syndrome: Secondary | ICD-10-CM | POA: Diagnosis not present

## 2023-12-29 LAB — CUP PACEART REMOTE DEVICE CHECK
Battery Remaining Longevity: 90 mo
Battery Remaining Percentage: 86 %
Brady Statistic RA Percent Paced: 76 %
Brady Statistic RV Percent Paced: 2 %
Date Time Interrogation Session: 20250717025100
Implantable Lead Connection Status: 753985
Implantable Lead Connection Status: 753985
Implantable Lead Implant Date: 20191203
Implantable Lead Implant Date: 20191203
Implantable Lead Location: 753859
Implantable Lead Location: 753860
Implantable Lead Model: 7740
Implantable Lead Model: 7741
Implantable Lead Serial Number: 1095486
Implantable Lead Serial Number: 711800
Implantable Pulse Generator Implant Date: 20191203
Lead Channel Impedance Value: 586 Ohm
Lead Channel Impedance Value: 646 Ohm
Lead Channel Pacing Threshold Amplitude: 0.4 V
Lead Channel Pacing Threshold Amplitude: 1.2 V
Lead Channel Pacing Threshold Pulse Width: 0.4 ms
Lead Channel Pacing Threshold Pulse Width: 0.4 ms
Lead Channel Setting Pacing Amplitude: 3.5 V
Lead Channel Setting Pacing Amplitude: 3.5 V
Lead Channel Setting Pacing Pulse Width: 0.4 ms
Lead Channel Setting Sensing Sensitivity: 2.5 mV
Pulse Gen Serial Number: 838922
Zone Setting Status: 755011

## 2024-01-01 ENCOUNTER — Ambulatory Visit: Payer: Self-pay | Admitting: Internal Medicine

## 2024-01-02 ENCOUNTER — Other Ambulatory Visit: Payer: Self-pay | Admitting: Family Medicine

## 2024-01-02 DIAGNOSIS — R21 Rash and other nonspecific skin eruption: Secondary | ICD-10-CM

## 2024-01-17 ENCOUNTER — Encounter: Payer: Self-pay | Admitting: Physician Assistant

## 2024-01-17 ENCOUNTER — Ambulatory Visit (INDEPENDENT_AMBULATORY_CARE_PROVIDER_SITE_OTHER): Admitting: Physician Assistant

## 2024-01-17 VITALS — BP 129/76 | HR 66 | Temp 97.6°F | Ht 68.0 in | Wt 186.8 lb

## 2024-01-17 DIAGNOSIS — R0981 Nasal congestion: Secondary | ICD-10-CM

## 2024-01-17 DIAGNOSIS — U071 COVID-19: Secondary | ICD-10-CM | POA: Diagnosis not present

## 2024-01-17 LAB — POC COVID19 BINAXNOW: SARS Coronavirus 2 Ag: POSITIVE — AB

## 2024-01-17 MED ORDER — AZELASTINE HCL 0.1 % NA SOLN
1.0000 | Freq: Two times a day (BID) | NASAL | 0 refills | Status: AC
Start: 2024-01-17 — End: ?

## 2024-01-17 NOTE — Progress Notes (Signed)
      Established patient visit   Patient: Daniel Reyes   DOB: [birth year]   78 y.o. Male  MRN: [id number] Visit Date: 01/17/2024  Today's healthcare provider: Manuelita Flatness, PA-C   Chief Complaint  Patient presents with   Cough   Nasal Congestion    Symptoms started last Sat after returning home from a cruise.  Neg home covid test on Monday   Subjective     Pt reports nasal congestion, cough, sore throat, started when he came back from a cruise this Saturday. His daughter in law tested positive for COVID, he tested negative on a home test Monday.  Denies chest pain, SOB, wheezing.  Medications: Outpatient Medications Prior to Visit  Medication Sig   clotrimazole  (LOTRIMIN ) 1 % cream APPLY TO AFFECTED AREA TWICE A DAY FOR 7-14 DAYS   [DISCONTINUED] triamcinolone  cream (KENALOG ) 0.1 % Apply 1 Application topically 2 (two) times daily.   No facility-administered medications prior to visit.    Review of Systems  Constitutional:  Negative for fatigue and fever.  HENT:  Positive for congestion, postnasal drip, rhinorrhea and sore throat.   Respiratory:  Positive for cough. Negative for shortness of breath.   Cardiovascular:  Negative for chest pain, palpitations and leg swelling.  Neurological:  Negative for dizziness and headaches.       Objective    BP 129/76   Pulse 66   Temp 97.6 F (36.4 C)   Ht 5' 8 (1.727 m)   Wt 186 lb 12.8 oz (84.7 kg)   SpO2 98%   BMI 28.40 kg/m    Physical Exam Constitutional:      General: He is awake.     Appearance: He is well-developed.  HENT:     Head: Normocephalic.  Eyes:     Conjunctiva/sclera: Conjunctivae normal.  Cardiovascular:     Rate and Rhythm: Normal rate and regular rhythm.     Heart sounds: Normal heart sounds.  Pulmonary:     Effort: Pulmonary effort is normal.     Breath sounds: Normal breath sounds.  Skin:    General: Skin is warm.  Neurological:     Mental Status: He is alert and oriented to person,  place, and time.  Psychiatric:        Attention and Perception: Attention normal.        Mood and Affect: Mood normal.        Speech: Speech normal.        Behavior: Behavior is cooperative.      Results for orders placed or performed in visit on 01/17/24  POC COVID-19  Result Value Ref Range   SARS Coronavirus 2 Ag Positive (A) Negative    Assessment & Plan    COVID -     Azelastine  HCl; Place 1 spray into both nostrils 2 (two) times daily. Use in each nostril as directed  Dispense: 30 mL; Refill: 0  Nasal congestion -     POC COVID-19 BinaxNow    Poc covid positive. Reviewed pros/cons antiviral medicine, at this point in the illness, and minimal symptoms, recommending against.  Rx azelastine , recommend hydration, antihistamines otc, tylenol   Return if symptoms worsen or fail to improve.       Manuelita Flatness, PA-C  [facility] at [facility] [phone] (phone) [phone] (fax)  [facility]

## 2024-01-19 ENCOUNTER — Ambulatory Visit: Admitting: Medical

## 2024-02-01 ENCOUNTER — Ambulatory Visit

## 2024-02-01 ENCOUNTER — Telehealth: Payer: Self-pay

## 2024-02-01 NOTE — Telephone Encounter (Signed)
 Unsuccessful attempts to reach patient on preferred number listed in notes for scheduled AWV. Left message on voicemail okay to reschedule.

## 2024-02-08 DIAGNOSIS — H35373 Puckering of macula, bilateral: Secondary | ICD-10-CM | POA: Diagnosis not present

## 2024-02-08 DIAGNOSIS — H25813 Combined forms of age-related cataract, bilateral: Secondary | ICD-10-CM | POA: Diagnosis not present

## 2024-02-08 DIAGNOSIS — Z83511 Family history of glaucoma: Secondary | ICD-10-CM | POA: Diagnosis not present

## 2024-02-08 DIAGNOSIS — H43813 Vitreous degeneration, bilateral: Secondary | ICD-10-CM | POA: Diagnosis not present

## 2024-03-14 NOTE — Progress Notes (Deleted)
 No chief complaint on file.   Sharolyn CHRISTELLA Potters here for URI complaints.  Duration: {Numbers; 1-10:13787} {Time; day/wk/mo/yr:20843}  Associated symptoms: {URI Symptoms :210800001} Denies: {URI Symptoms :210800001} Treatment to date: *** Sick contacts: {yes/no:20286}  Past Medical History:  Diagnosis Date   Dyspnea    GERD (gastroesophageal reflux disease)    History of chicken pox    Hyperlipemia    Mild CAD    a. cath 02/09/17 - Mild to moderate proximal and mid LAD plaque with less than 40% narrowing   Nocturnal hypoxia    Presence of permanent cardiac pacemaker 2019   approximately 2 years ago    Objective There were no vitals taken for this visit. General: Awake, alert, appears stated age HEENT: AT, [place], ears patent b/l and TM's neg, nares patent w/o discharge, pharynx pink and without exudates, MMM Neck: No masses or asymmetry Heart: RRR Lungs: CTAB, no accessory muscle use Psych: Age appropriate judgment and insight, normal mood and affect  No diagnosis found.  Continue to push fluids, practice good hand hygiene, cover mouth when coughing. F/u prn. If starting to experience fevers, shaking, or shortness of breath, seek immediate care. Pt voiced understanding and agreement to the plan.  Harlene LITTIE Jolly, DNP, AGNP-C 03/14/24 12:46 PM

## 2024-03-15 ENCOUNTER — Ambulatory Visit: Admitting: Student

## 2024-03-18 ENCOUNTER — Telehealth: Admitting: Physician Assistant

## 2024-03-18 ENCOUNTER — Ambulatory Visit: Admitting: Family Medicine

## 2024-03-18 ENCOUNTER — Ambulatory Visit: Payer: Self-pay

## 2024-03-18 ENCOUNTER — Telehealth: Payer: Self-pay | Admitting: Family Medicine

## 2024-03-18 DIAGNOSIS — J069 Acute upper respiratory infection, unspecified: Secondary | ICD-10-CM

## 2024-03-18 MED ORDER — AZITHROMYCIN 250 MG PO TABS: ORAL_TABLET | ORAL | 0 refills | Status: AC

## 2024-03-18 MED ORDER — PREDNISONE 20 MG PO TABS: 40.0000 mg | ORAL_TABLET | Freq: Every day | ORAL | 0 refills | Status: AC

## 2024-03-18 NOTE — Telephone Encounter (Signed)
 FYI Only or Action Required?: Action required by provider: clinical question for provider.  Patient was last seen in primary care on 01/17/2024 by Cyndi Shaver, PA-C.  Called Nurse Triage reporting Nasal Congestion.  Symptoms began a week ago.  Symptoms are: gradually worsening.  Triage Disposition: See PCP When Office is Open (Within 3 Days)  Patient/caregiver understands and will follow disposition?: Yes      Copied from CRM #[id number]. Topic: Clinical - Red Word Triage >> Mar 18, 2024  4:36 PM Hillary B wrote: Red Word that prompted transfer to Nurse Triage: Patient is experiencing chest congestion and head congestion. He is requesting medications to be sent in for him! He missed original appointment due to E2C2 error and may need a new visit if he is open to it.      Reason for Disposition  [1] Sinus congestion (pressure, fullness) AND [2] present > 10 days  Answer Assessment - Initial Assessment Questions Telehealth appointment scheduled for today. Patient is requesting a Prednisone prescription and wanted to know if it could be prescribed prior to his appointment.      1. LOCATION: Where does it hurt?      No pain 2. ONSET: When did the sinus pain start?  (e.g., hours, days)      About a week ago  3. SEVERITY: How bad is the pain?   (Scale 0-10; or none, mild, moderate or severe)     No pain  4. RECURRENT SYMPTOM: Have you ever had sinus problems before? If Yes, ask: When was the last time? and What happened that time?      Yes, has been treated in the past with Prednisone  5. NASAL CONGESTION: Is the nose blocked? If Yes, ask: Can you open it or must you breathe through your mouth?     Yes 6. NASAL DISCHARGE: Do you have discharge from your nose? If so ask, What color?     Yellow  7. FEVER: Do you have a fever? If Yes, ask: What is it, how was it measured, and when did it start?      No 8. OTHER SYMPTOMS: Do you have any other symptoms?  (e.g., sore throat, cough, earache, difficulty breathing)     Eye drainage, chest congestion, some chest pain with cough only  Protocols used: Sinus Pain or Congestion-A-AH

## 2024-03-18 NOTE — Patient Instructions (Signed)
  Daniel Reyes, thank you for joining Harlene PEDLAR Ward, PA-C for today's virtual visit.  While this provider is not your primary care provider (PCP), if your PCP is located in our provider database this encounter information will be shared with them immediately following your visit.   A [facility] MyChart account gives you access to today's visit and all your visits, tests, and labs performed at [facility]  click here if you don't have a [facility] MyChart account or go to [email or web address]  Consent: (Patient) Daniel Reyes provided verbal consent for this virtual visit at the beginning of the encounter.  Current Medications:  Current Outpatient Medications:    azithromycin (ZITHROMAX) 250 MG tablet, Take 2 tablets on day 1, then 1 tablet daily on days 2 through 5, Disp: 6 tablet, Rfl: 0   predniSONE (DELTASONE) 20 MG tablet, Take 2 tablets (40 mg total) by mouth daily with breakfast for 5 days., Disp: 10 tablet, Rfl: 0   azelastine  (ASTELIN ) 0.1 % nasal spray, Place 1 spray into both nostrils 2 (two) times daily. Use in each nostril as directed, Disp: 30 mL, Rfl: 0   clotrimazole  (LOTRIMIN ) 1 % cream, APPLY TO AFFECTED AREA TWICE A DAY FOR 7-14 DAYS, Disp: 30 g, Rfl: 0   Medications ordered in this encounter:  Meds ordered this encounter  Medications   predniSONE (DELTASONE) 20 MG tablet    Sig: Take 2 tablets (40 mg total) by mouth daily with breakfast for 5 days.    Dispense:  10 tablet    Refill:  0    Supervising Provider:   LAMPTEY, PHILIP O [[id number]]   azithromycin (ZITHROMAX) 250 MG tablet    Sig: Take 2 tablets on day 1, then 1 tablet daily on days 2 through 5    Dispense:  6 tablet    Refill:  0    Supervising Provider:   LAMPTEY, PHILIP O [phone]     *If you need refills on other medications prior to your next appointment, please contact your pharmacy*  Follow-Up: Call back or seek an in-person evaluation if the symptoms worsen or if the condition  fails to improve as anticipated.  [facility] [phone]  Other Instructions Recommend Flonase  and Mucinex  for congestion.  Drink plenty of fluids. Can take prednisone as prescribed.  If symptoms last longer than 7-10 days may initiate the antibiotic.    If you have been instructed to have an in-person evaluation today at a local Urgent Care facility, please use the link below. It will take you to a list of all of our available [facility], including address, phone number and hours of operation. Please do not delay care.  [facility]  If you or a family member do not have a primary care provider, use the link below to schedule a visit and establish care. When you choose a [facility] primary care physician or advanced practice provider, you gain a long-term partner in health. Find a Primary Care Provider  Learn more about [facility]'s in-office and virtual care options: [facility] - Get Care Now

## 2024-03-18 NOTE — Telephone Encounter (Signed)
 Copied from CRM #[id number]. Topic: General - Other >> Mar 18, 2024  3:58 PM Franky GRADE wrote: Reason for CRM: Patient was scheduled at [facility] but was not informed of the address or information of the practice and went to [place] which is his home office. Patient is upset and does not want to receive a no show bill as it's not his fault for not going to the correct location.

## 2024-03-18 NOTE — Progress Notes (Signed)
 Virtual Visit Consent   [name], you are scheduled for a virtual visit with a [facility] provider today. Just as with appointments in the office, your consent must be obtained to participate. Your consent will be active for this visit and any virtual visit you may have with one of our providers in the next 365 days. If you have a MyChart account, a copy of this consent can be sent to you electronically.  As this is a virtual visit, video technology does not allow for your provider to perform a traditional examination. This may limit your provider's ability to fully assess your condition. If your provider identifies any concerns that need to be evaluated in person or the need to arrange testing (such as labs, EKG, etc.), we will make arrangements to do so. Although advances in technology are sophisticated, we cannot ensure that it will always work on either your end or our end. If the connection with a video visit is poor, the visit may have to be switched to a telephone visit. With either a video or telephone visit, we are not always able to ensure that we have a secure connection.  By engaging in this virtual visit, you consent to the provision of healthcare and authorize for your insurance to be billed (if applicable) for the services provided during this visit. Depending on your insurance coverage, you may receive a charge related to this service.  I need to obtain your verbal consent now. Are you willing to proceed with your visit today? BRIAR WITHERSPOON has provided verbal consent on 03/18/2024 for a virtual visit (video or telephone). Harlene PEDLAR Ward, PA-C  Date: 03/18/2024 6:08 PM   Virtual Visit via Video Note   I, Harlene PEDLAR Ward, connected with  Daniel Reyes  ([id number], Feb 20, 1946) on 03/18/24 at  6:00 PM EDT by a video-enabled telemedicine application and verified that I am speaking with the correct person using two identifiers.  Location: Patient: Virtual Visit Location Patient:  Home Provider: Virtual Visit Location Provider: Home Office   I discussed the limitations of evaluation and management by telemedicine and the availability of in person appointments. The patient expressed understanding and agreed to proceed.    History of Present Illness: Daniel Reyes is a 78 y.o. who identifies as a male who was assigned male at birth, and is being seen today for congestion, sinus pressure, mild intermittent cough that started several days ago. Denies shortness of breath or wheezing.  Denies fever.  Has been using aftrin with some relief.  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   Abnormal CT of the head 11/27/2019   Pharyngitis 11/25/2019   Hyperreflexia 10/21/2019   Other fatigue 10/21/2019   Right hip pain 01/23/2019   Age-related nuclear cataract of both eyes 01/22/2019   Cortical age-related cataract of both eyes 01/22/2019   Dermatochalasis of both upper eyelids 01/22/2019   Dry eye syndrome of both lacrimal glands 01/22/2019   Epiretinal membrane (ERM) of both eyes 01/22/2019   Glaucoma suspect of both eyes 01/22/2019   Posterior vitreous detachment of right eye 01/22/2019   Constipation 09/28/2018   Sinus node dysfunction (HCC) 05/15/2018   Nocturia 12/02/2017   Muscle cramps 11/30/2017   Asthma 08/15/2017   Hyperglycemia 05/29/2017   Anemia 05/29/2017   Barretts esophagus 05/29/2017  Low back pain 05/29/2017   History of chicken pox    Mild CAD 02/15/2017   DOE (dyspnea on exertion) 02/09/2017   Nocturnal hypoxia    OSA on CPAP 01/05/2017   Balanitis 10/05/2016   Dizziness 04/14/2015   Disequilibrium 04/14/2015   RBBB 04/13/2015   Right fascicular block 04/13/2015   Hyperlipidemia 01/31/2015   Benign neoplasm of colon, unspecified 01/13/2015   Adenomatous polyp of colon  01/13/2015   Lumbar radiculopathy 11/04/2014   Other intervertebral disc degeneration, lumbar region 10/09/2014    Allergies:  Allergies  Allergen Reactions   Imdur  [Isosorbide  Dinitrate] Anaphylaxis and Nausea And Vomiting    Nose swelling with difficulty breathing thru nose   Medications:  Current Outpatient Medications:    azithromycin (ZITHROMAX) 250 MG tablet, Take 2 tablets on day 1, then 1 tablet daily on days 2 through 5, Disp: 6 tablet, Rfl: 0   predniSONE (DELTASONE) 20 MG tablet, Take 2 tablets (40 mg total) by mouth daily with breakfast for 5 days., Disp: 10 tablet, Rfl: 0   azelastine  (ASTELIN ) 0.1 % nasal spray, Place 1 spray into both nostrils 2 (two) times daily. Use in each nostril as directed, Disp: 30 mL, Rfl: 0   clotrimazole  (LOTRIMIN ) 1 % cream, APPLY TO AFFECTED AREA TWICE A DAY FOR 7-14 DAYS, Disp: 30 g, Rfl: 0  Observations/Objective: Patient is well-developed, well-nourished in no acute distress.  Resting comfortably at home.  Head is normocephalic, atraumatic.  No labored breathing.  Speech is clear and coherent with logical content.  Patient is alert and oriented at baseline.    Assessment and Plan: 1. Viral upper respiratory tract infection (Primary)  Pt will be traveling soon and is requesting a prednisone course. I have also sent in azithromycin to take if sx do not resolve after 7-10 days.   Follow Up Instructions: I discussed the assessment and treatment plan with the patient. The patient was provided an opportunity to ask questions and all were answered. The patient agreed with the plan and demonstrated an understanding of the instructions.  A copy of instructions were sent to the patient via MyChart unless otherwise noted below.     The patient was advised to call back or seek an in-person evaluation if the symptoms worsen or if the condition fails to improve as anticipated.    Harlene PEDLAR Ward, PA-C

## 2024-03-19 NOTE — Telephone Encounter (Signed)
 Cancelled appt since pt did not have the correct address. Not counted as missed visit.

## 2024-03-19 NOTE — Progress Notes (Signed)
 Remote PPM Transmission

## 2024-03-28 ENCOUNTER — Ambulatory Visit: Payer: Medicare HMO

## 2024-03-28 DIAGNOSIS — I495 Sick sinus syndrome: Secondary | ICD-10-CM | POA: Diagnosis not present

## 2024-03-29 LAB — CUP PACEART REMOTE DEVICE CHECK
Battery Remaining Longevity: 90 mo
Battery Remaining Percentage: 88 %
Brady Statistic RA Percent Paced: 76 %
Brady Statistic RV Percent Paced: 2 %
Date Time Interrogation Session: 20251016025000
Implantable Lead Connection Status: 753985
Implantable Lead Connection Status: 753985
Implantable Lead Implant Date: 20191203
Implantable Lead Implant Date: 20191203
Implantable Lead Location: 753859
Implantable Lead Location: 753860
Implantable Lead Model: 7740
Implantable Lead Model: 7741
Implantable Lead Serial Number: 1095486
Implantable Lead Serial Number: 711800
Implantable Pulse Generator Implant Date: 20191203
Lead Channel Impedance Value: 573 Ohm
Lead Channel Impedance Value: 576 Ohm
Lead Channel Pacing Threshold Amplitude: 0.4 V
Lead Channel Pacing Threshold Amplitude: 1.3 V
Lead Channel Pacing Threshold Pulse Width: 0.4 ms
Lead Channel Pacing Threshold Pulse Width: 0.4 ms
Lead Channel Setting Pacing Amplitude: 3.5 V
Lead Channel Setting Pacing Amplitude: 3.5 V
Lead Channel Setting Pacing Pulse Width: 0.4 ms
Lead Channel Setting Sensing Sensitivity: 2.5 mV
Pulse Gen Serial Number: 838922
Zone Setting Status: 755011

## 2024-03-31 ENCOUNTER — Ambulatory Visit: Payer: Self-pay | Admitting: Internal Medicine

## 2024-04-03 NOTE — Progress Notes (Signed)
 Remote PPM Transmission

## 2024-04-08 ENCOUNTER — Telehealth: Payer: Self-pay | Admitting: Family Medicine

## 2024-04-08 NOTE — Telephone Encounter (Signed)
 Copied from CRM [id number]. Topic: Medicare AWV >> Apr 08, 2024 11:13 AM Nathanel DEL wrote: Reason for CRM: Called LVM 04/08/2024 to schedule AWV. Please schedule office or virtual visits  Nathanel Paschal; Care Guide Ambulatory Clinical Support [facility] [facility] Direct Dial: [phone]

## 2024-05-12 DIAGNOSIS — R051 Acute cough: Secondary | ICD-10-CM | POA: Diagnosis not present

## 2024-05-12 DIAGNOSIS — R0981 Nasal congestion: Secondary | ICD-10-CM | POA: Diagnosis not present

## 2024-06-27 ENCOUNTER — Ambulatory Visit (INDEPENDENT_AMBULATORY_CARE_PROVIDER_SITE_OTHER): Payer: Medicare HMO

## 2024-06-27 DIAGNOSIS — I495 Sick sinus syndrome: Secondary | ICD-10-CM

## 2024-06-27 LAB — CUP PACEART REMOTE DEVICE CHECK
Battery Remaining Longevity: 84 mo
Battery Remaining Percentage: 81 %
Brady Statistic RA Percent Paced: 76 %
Brady Statistic RV Percent Paced: 1 %
Date Time Interrogation Session: 20260115025100
Implantable Lead Connection Status: 753985
Implantable Lead Connection Status: 753985
Implantable Lead Implant Date: 20191203
Implantable Lead Implant Date: 20191203
Implantable Lead Location: 753859
Implantable Lead Location: 753860
Implantable Lead Model: 7740
Implantable Lead Model: 7741
Implantable Lead Serial Number: 1095486
Implantable Lead Serial Number: 711800
Implantable Pulse Generator Implant Date: 20191203
Lead Channel Impedance Value: 559 Ohm
Lead Channel Impedance Value: 615 Ohm
Lead Channel Pacing Threshold Amplitude: 0.4 V
Lead Channel Pacing Threshold Amplitude: 1 V
Lead Channel Pacing Threshold Pulse Width: 0.4 ms
Lead Channel Pacing Threshold Pulse Width: 0.4 ms
Lead Channel Setting Pacing Amplitude: 3.5 V
Lead Channel Setting Pacing Amplitude: 3.5 V
Lead Channel Setting Pacing Pulse Width: 0.4 ms
Lead Channel Setting Sensing Sensitivity: 2.5 mV
Pulse Gen Serial Number: 838922
Zone Setting Status: 755011

## 2024-06-28 ENCOUNTER — Ambulatory Visit: Payer: Self-pay | Admitting: Cardiovascular Disease

## 2024-07-04 NOTE — Progress Notes (Signed)
 Remote PPM Transmission
